# Patient Record
Sex: Male | Born: 1955 | Race: White | Hispanic: No | Marital: Single | Smoking: Former smoker
Health system: Southern US, Community
[De-identification: ages and names within clinical notes are randomized; demographics above are authoritative.]

## PROBLEM LIST (undated history)

## (undated) DIAGNOSIS — I739 Peripheral vascular disease, unspecified: Secondary | ICD-10-CM

## (undated) DIAGNOSIS — I10 Essential (primary) hypertension: Secondary | ICD-10-CM

## (undated) DIAGNOSIS — I2510 Atherosclerotic heart disease of native coronary artery without angina pectoris: Secondary | ICD-10-CM

## (undated) DIAGNOSIS — E785 Hyperlipidemia, unspecified: Secondary | ICD-10-CM

## (undated) DIAGNOSIS — Z85841 Personal history of malignant neoplasm of brain: Secondary | ICD-10-CM

---

## 2010-03-05 HISTORY — PX: OTHER SURGICAL HISTORY: SHX169

## 2019-08-31 ENCOUNTER — Other Ambulatory Visit: Payer: Self-pay

## 2019-08-31 ENCOUNTER — Encounter: Payer: Self-pay | Admitting: Emergency Medicine

## 2019-08-31 ENCOUNTER — Ambulatory Visit
Admission: EM | Admit: 2019-08-31 | Discharge: 2019-08-31 | Disposition: A | Discharge status: Home / Self Care | Payer: Self-pay

## 2019-08-31 DIAGNOSIS — H60392 Other infective otitis externa, left ear: Secondary | ICD-10-CM

## 2019-08-31 HISTORY — DX: Essential (primary) hypertension: I10

## 2019-08-31 MED ORDER — AMOXICILLIN 500 MG PO TABS: 500.0000 mg | ORAL_TABLET | Freq: Two times a day (BID) | ORAL | 0 refills | Status: AC

## 2019-08-31 NOTE — ED Provider Notes (Signed)
[facility]    CSN: [id number] Arrival date & time: 08/31/19  1044      History   Chief Complaint Chief Complaint  Patient presents with  . Otalgia    HPI Roger Solis is a 64 y.o. male with history of hypertension presenting for 2-day course of left ear pain and swelling.  Patient noted some drainage last night.  Denies trauma, travel, change in hearing, tinnitus, dizziness.  No fever, foreign body exposure or sensation.  States he has had ear infections in the past: Topical otic drops do not help, requesting amoxicillin.   Past Medical History:  Diagnosis Date  . Hypertension     There are no problems to display for this patient.   History reviewed. No pertinent surgical history.     Home Medications    Prior to Admission medications   Medication Sig Start Date End Date Taking? Authorizing Provider  aspirin EC 81 MG tablet Take 81 mg by mouth daily. Swallow whole.   Yes [provider]  carvedilol (COREG) 25 MG tablet Take 25 mg by mouth 2 (two) times daily with a meal.   Yes [provider]  clopidogrel (PLAVIX) 75 MG tablet Take 75 mg by mouth daily.   Yes [provider]  pravastatin (PRAVACHOL) 40 MG tablet Take 40 mg by mouth daily.   Yes [provider]  triamterene-hydrochlorothiazide (MAXZIDE) 75-50 MG tablet Take 1 tablet by mouth daily.   Yes [provider]  amoxicillin (AMOXIL) 500 MG tablet Take 1 tablet (500 mg total) by mouth 2 (two) times daily for 7 days. 08/31/19 09/07/19  Hall-Potvin, [place], PA-C    Family History Family History  Problem Relation Age of Onset  . Heart disease Mother   . Heart disease Father     Social History Social History   Tobacco Use  . Smoking status: Current Every Day Smoker    Packs/day: 1.00  . Smokeless tobacco: Never Used  Substance Use Topics  . Alcohol use: Yes    Comment: six pack a week  . Drug use: Never     Allergies   Patient has no  known allergies.   Review of Systems As per HPI   Physical Exam Triage Vital Signs ED Triage Vitals  Enc Vitals Group     BP      Pulse      Resp      Temp      Temp src      SpO2      Weight      Height      Head Circumference      Peak Flow      Pain Score      Pain Loc      Pain Edu?      Excl. in [place]?    No data found.  Updated Vital Signs BP 125/73 (BP Location: Left Arm)   Pulse 68   Temp (!) 97.5 F (36.4 C) (Oral)   Resp 18   SpO2 94%   Visual Acuity Right Eye Distance:   Left Eye Distance:   Bilateral Distance:    Right Eye Near:   Left Eye Near:    Bilateral Near:     Physical Exam Constitutional:      General: He is not in acute distress. HENT:     Head: Normocephalic and atraumatic.     Right Ear: Tympanic membrane, ear canal and external ear normal.  Left Ear: Tympanic membrane normal.     Ears:     Comments: Left ear with tragal tenderness.  Mild EAC swelling of left ear with moderate discharge.  No TM perforation, bulging, bleeding.  No foreign body Eyes:     General: No scleral icterus.    Pupils: Pupils are equal, round, and reactive to light.  Cardiovascular:     Rate and Rhythm: Normal rate.  Pulmonary:     Effort: Pulmonary effort is normal. No respiratory distress.     Breath sounds: No wheezing.  Skin:    Coloration: Skin is not jaundiced or pale.  Neurological:     Mental Status: He is alert and oriented to person, place, and time.      UC Treatments / Results  Labs (all labs ordered are listed, but only abnormal results are displayed) Labs Reviewed - No data to display  EKG   Radiology No results found.  Procedures Procedures (including critical care time)  Medications Ordered in UC Medications - No data to display  Initial Impression / Assessment and Plan / UC Course  I have reviewed the triage vital signs and the nursing notes.  Pertinent labs & imaging results that were available during my care of  the patient were reviewed by me and considered in my medical decision making (see chart for details).     Patient febrile, nontoxic in office today.  No change in hearing, systemic symptoms as mentioned in HPI.  Patient states otic drops have not been helpful in the past: States he gets ear infections almost annually.  Provided contact information for ENT for further evaluation/management for recurrent otitis externa.  Will provide amoxicillin in the interim.  Return precautions discussed, patient verbalized understanding and is agreeable to plan. Final Clinical Impressions(s) / UC Diagnoses   Final diagnoses:  Infective otitis externa of left ear     Discharge Instructions     Take antibiotic as prescribed for the next week. Return for worsening ear pain, swelling, discharge, bleeding, decreased hearing, development of jaw pain/swelling, fever.  Do NOT use Q-tips as these can cause your ear wax to get stuck, the tips may break off and become a foreign body requiring additional medical care, or puncture your eardrum.  Helpful prevention tip: Use a solution of equal parts isopropyl (rubbing) alcohol and white vinegar (acetic acid) in both ears after swimming.    ED Prescriptions    Medication Sig Dispense Auth. Provider   amoxicillin (AMOXIL) 500 MG tablet Take 1 tablet (500 mg total) by mouth 2 (two) times daily for 7 days. 14 tablet Hall-Potvin, [place], PA-C     PDMP not reviewed this encounter.   [place], [place] 08/31/19 [phone]

## 2019-08-31 NOTE — Discharge Instructions (Signed)
Take antibiotic as prescribed for the next week. Return for worsening ear pain, swelling, discharge, bleeding, decreased hearing, development of jaw pain/swelling, fever.  Do NOT use Q-tips as these can cause your ear wax to get stuck, the tips may break off and become a foreign body requiring additional medical care, or puncture your eardrum.  Helpful prevention tip: Use a solution of equal parts isopropyl (rubbing) alcohol and white vinegar (acetic acid) in both ears after swimming.

## 2019-08-31 NOTE — ED Triage Notes (Signed)
Pt presents to [facility]  For assessment of left ear pain x 2 days with some clear drainage last night.

## 2021-05-05 ENCOUNTER — Emergency Department (HOSPITAL_COMMUNITY): Payer: No Typology Code available for payment source

## 2021-05-05 ENCOUNTER — Observation Stay (HOSPITAL_COMMUNITY)
Admission: EM | Admit: 2021-05-05 | Discharge: 2021-05-05 | Disposition: A | Discharge status: Home / Self Care | Payer: No Typology Code available for payment source | Attending: Internal Medicine | Admitting: Internal Medicine

## 2021-05-05 ENCOUNTER — Encounter (HOSPITAL_COMMUNITY): Payer: Self-pay | Admitting: *Deleted

## 2021-05-05 ENCOUNTER — Other Ambulatory Visit: Payer: Self-pay

## 2021-05-05 DIAGNOSIS — Z20822 Contact with and (suspected) exposure to covid-19: Secondary | ICD-10-CM | POA: Insufficient documentation

## 2021-05-05 DIAGNOSIS — E46 Unspecified protein-calorie malnutrition: Secondary | ICD-10-CM | POA: Diagnosis not present

## 2021-05-05 DIAGNOSIS — C7931 Secondary malignant neoplasm of brain: Secondary | ICD-10-CM | POA: Insufficient documentation

## 2021-05-05 DIAGNOSIS — Z7982 Long term (current) use of aspirin: Secondary | ICD-10-CM | POA: Diagnosis not present

## 2021-05-05 DIAGNOSIS — D649 Anemia, unspecified: Secondary | ICD-10-CM | POA: Insufficient documentation

## 2021-05-05 DIAGNOSIS — I2510 Atherosclerotic heart disease of native coronary artery without angina pectoris: Secondary | ICD-10-CM | POA: Diagnosis not present

## 2021-05-05 DIAGNOSIS — Z951 Presence of aortocoronary bypass graft: Secondary | ICD-10-CM | POA: Insufficient documentation

## 2021-05-05 DIAGNOSIS — R569 Unspecified convulsions: Secondary | ICD-10-CM

## 2021-05-05 DIAGNOSIS — Z7901 Long term (current) use of anticoagulants: Secondary | ICD-10-CM | POA: Insufficient documentation

## 2021-05-05 DIAGNOSIS — I629 Nontraumatic intracranial hemorrhage, unspecified: Principal | ICD-10-CM | POA: Insufficient documentation

## 2021-05-05 DIAGNOSIS — F17210 Nicotine dependence, cigarettes, uncomplicated: Secondary | ICD-10-CM | POA: Insufficient documentation

## 2021-05-05 DIAGNOSIS — C3490 Malignant neoplasm of unspecified part of unspecified bronchus or lung: Secondary | ICD-10-CM | POA: Insufficient documentation

## 2021-05-05 DIAGNOSIS — Z95820 Peripheral vascular angioplasty status with implants and grafts: Secondary | ICD-10-CM | POA: Diagnosis not present

## 2021-05-05 DIAGNOSIS — I10 Essential (primary) hypertension: Secondary | ICD-10-CM | POA: Insufficient documentation

## 2021-05-05 DIAGNOSIS — Z79899 Other long term (current) drug therapy: Secondary | ICD-10-CM | POA: Diagnosis not present

## 2021-05-05 HISTORY — DX: Hyperlipidemia, unspecified: E78.5

## 2021-05-05 HISTORY — DX: Personal history of malignant neoplasm of brain: Z85.841

## 2021-05-05 HISTORY — DX: Atherosclerotic heart disease of native coronary artery without angina pectoris: I25.10

## 2021-05-05 HISTORY — DX: Peripheral vascular disease, unspecified: I73.9

## 2021-05-05 LAB — APTT
aPTT: 29 seconds (ref 24–36)
aPTT: 30 seconds (ref 24–36)

## 2021-05-05 LAB — COMPREHENSIVE METABOLIC PANEL
ALT: 13 U/L (ref 0–44)
AST: 17 U/L (ref 15–41)
Albumin: 3.2 g/dL — ABNORMAL LOW (ref 3.5–5.0)
Alkaline Phosphatase: 73 U/L (ref 38–126)
Anion gap: 7 (ref 5–15)
BUN: 8 mg/dL (ref 8–23)
CO2: 28 mmol/L (ref 22–32)
Calcium: 9.2 mg/dL (ref 8.9–10.3)
Chloride: 101 mmol/L (ref 98–111)
Creatinine, Ser: 0.84 mg/dL (ref 0.61–1.24)
GFR, Estimated: >60 mL/min (ref >60)
Glucose, Bld: 96 mg/dL (ref 70–99)
Potassium: 4.5 mmol/L (ref 3.5–5.1)
Sodium: 136 mmol/L (ref 135–145)
Total Bilirubin: 0.2 mg/dL — ABNORMAL LOW (ref 0.3–1.2)
Total Protein: 6.4 g/dL — ABNORMAL LOW (ref 6.5–8.1)

## 2021-05-05 LAB — RESP PANEL BY RT-PCR (FLU A&B, COVID) ARPGX2
Influenza A by PCR: NEGATIVE
Influenza B by PCR: NEGATIVE
SARS Coronavirus 2 by RT PCR: NEGATIVE

## 2021-05-05 LAB — PHOSPHORUS: Phosphorus: 3.5 mg/dL (ref 2.5–4.6)

## 2021-05-05 LAB — URINALYSIS, ROUTINE W REFLEX MICROSCOPIC
Bilirubin Urine: NEGATIVE
Glucose, UA: NEGATIVE mg/dL
Hgb urine dipstick: NEGATIVE
Ketones, ur: NEGATIVE mg/dL
Leukocytes,Ua: NEGATIVE
Nitrite: NEGATIVE
Protein, ur: NEGATIVE mg/dL
Specific Gravity, Urine: 1.01 (ref 1.005–1.030)
pH: 7 (ref 5.0–8.0)

## 2021-05-05 LAB — CBC
HCT: 39.1 % (ref 39.0–52.0)
Hemoglobin: 12.6 g/dL — ABNORMAL LOW (ref 13.0–17.0)
MCH: 29.9 pg (ref 26.0–34.0)
MCHC: 32.2 g/dL (ref 30.0–36.0)
MCV: 92.9 fL (ref 80.0–100.0)
Platelets: 215 10*3/uL (ref 150–400)
RBC: 4.21 MIL/uL — ABNORMAL LOW (ref 4.22–5.81)
RDW: 17.2 % — ABNORMAL HIGH (ref 11.5–15.5)
WBC: 5.4 10*3/uL (ref 4.0–10.5)
nRBC: 0 % (ref 0.0–0.2)

## 2021-05-05 LAB — RAPID URINE DRUG SCREEN, HOSP PERFORMED
Amphetamines: NOT DETECTED
Barbiturates: NOT DETECTED
Benzodiazepines: POSITIVE — AB
Cocaine: NOT DETECTED
Opiates: NOT DETECTED
Tetrahydrocannabinol: NOT DETECTED

## 2021-05-05 LAB — DIFFERENTIAL
Abs Immature Granulocytes: 0.03 10*3/uL (ref 0.00–0.07)
Basophils Absolute: 0 10*3/uL (ref 0.0–0.1)
Basophils Relative: 0 %
Eosinophils Absolute: 0.1 10*3/uL (ref 0.0–0.5)
Eosinophils Relative: 1 %
Immature Granulocytes: 1 %
Lymphocytes Relative: 18 %
Lymphs Abs: 1 10*3/uL (ref 0.7–4.0)
Monocytes Absolute: 0.7 10*3/uL (ref 0.1–1.0)
Monocytes Relative: 13 %
Neutro Abs: 3.6 10*3/uL (ref 1.7–7.7)
Neutrophils Relative %: 67 %

## 2021-05-05 LAB — ETHANOL: Alcohol, Ethyl (B): <10 mg/dL (ref <10)

## 2021-05-05 LAB — I-STAT CHEM 8, ED
BUN: 9 mg/dL (ref 8–23)
Calcium, Ion: 1.2 mmol/L (ref 1.15–1.40)
Chloride: 101 mmol/L (ref 98–111)
Creatinine, Ser: 0.9 mg/dL (ref 0.61–1.24)
Glucose, Bld: 90 mg/dL (ref 70–99)
HCT: 38 % — ABNORMAL LOW (ref 39.0–52.0)
Hemoglobin: 12.9 g/dL — ABNORMAL LOW (ref 13.0–17.0)
Potassium: 4.3 mmol/L (ref 3.5–5.1)
Sodium: 137 mmol/L (ref 135–145)
TCO2: 28 mmol/L (ref 22–32)

## 2021-05-05 LAB — PROTIME-INR
INR: 1 (ref 0.8–1.2)
INR: 1 (ref 0.8–1.2)
Prothrombin Time: 13.5 seconds (ref 11.4–15.2)
Prothrombin Time: 13.3 seconds (ref 11.4–15.2)

## 2021-05-05 LAB — MAGNESIUM: Magnesium: 1.7 mg/dL (ref 1.7–2.4)

## 2021-05-05 LAB — HIV ANTIBODY (ROUTINE TESTING W REFLEX): HIV Screen 4th Generation wRfx: NONREACTIVE

## 2021-05-05 IMAGING — CT CT HEAD W/O CM
3 series · 16 of 37 positions shown, 18 images · non-contrast
Comparison: None

CLINICAL DATA: Seizure, facial drooping LEFT, neurological deficit
suspected stroke, being treated for brain cancer, craniotomy 1 year
ago



[Series 3: head without · axial · non-contrast · 0.43mm/px · z∈[-104,+26]mm · 7 of 36 slices shown, 9 images]
[im 5/36  brain]
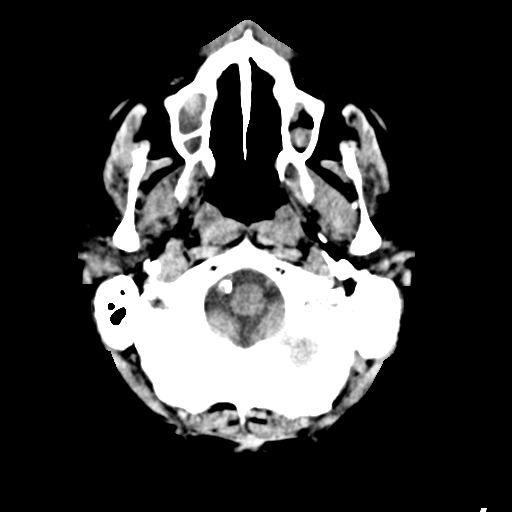
[im 5/36  bone]
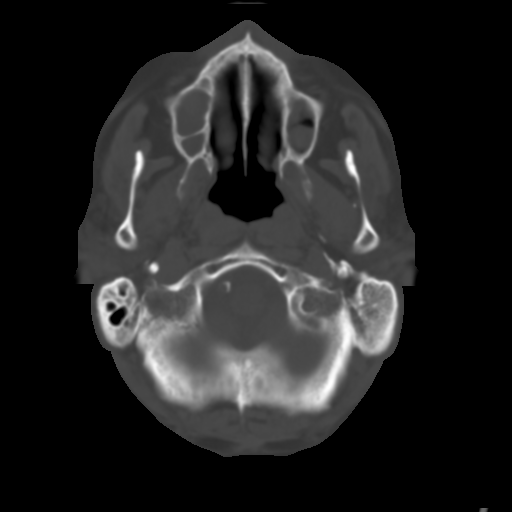
[im 9/36  brain]
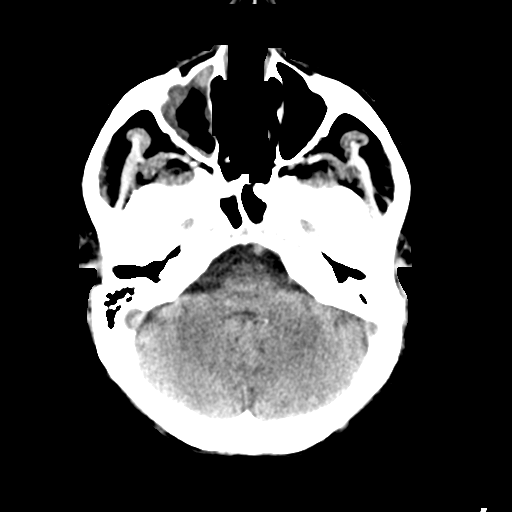
[im 14/36  brain]
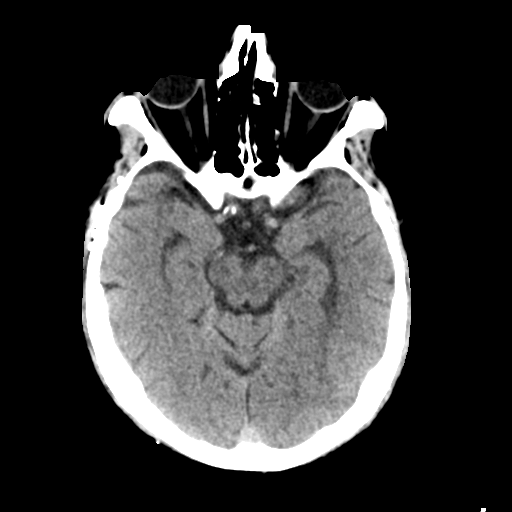
[im 18/36  brain]
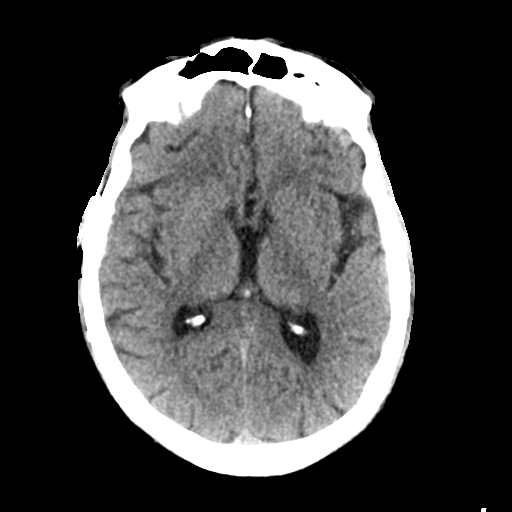
[im 22/36  brain]
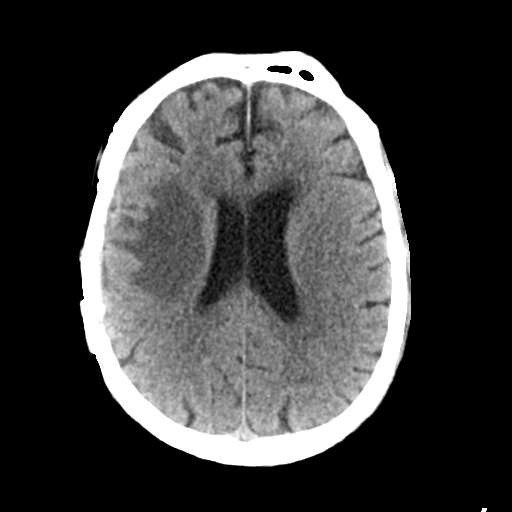
[im 22/36  bone]
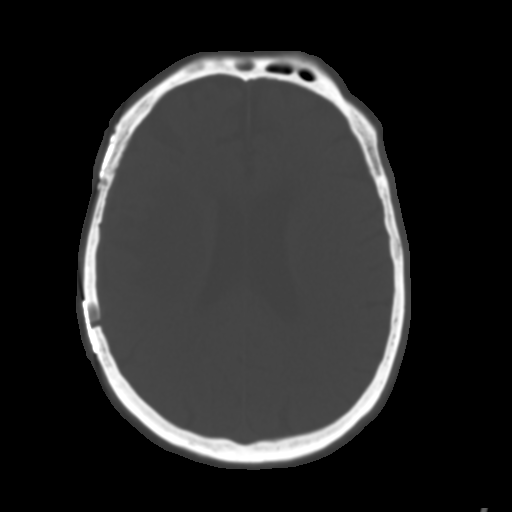
[im 27/36  brain]
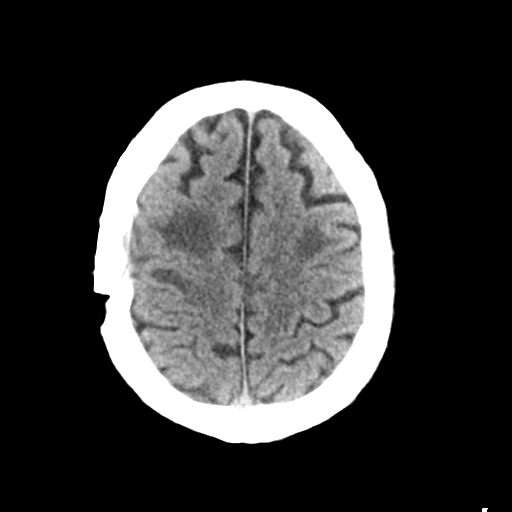
[im 31/36  brain]
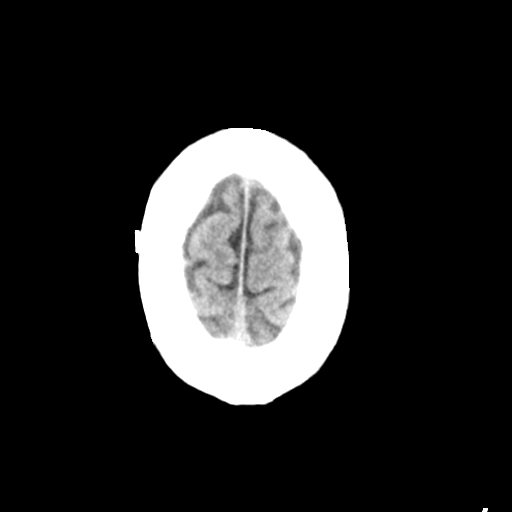

[Series 4: head bone · axial · 0.43mm/px · z∈[-108,-2]mm · 6 of 89 slices shown]
[im 9/89  bone]
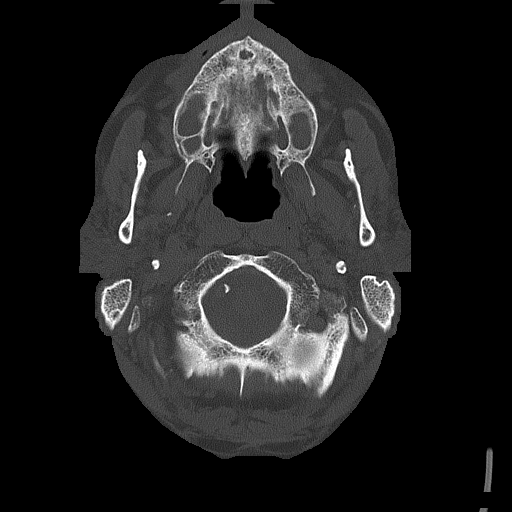
[im 18/89  bone]
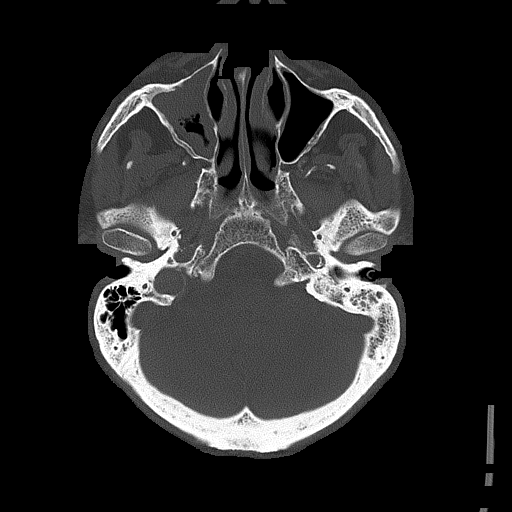
[im 27/89  bone]
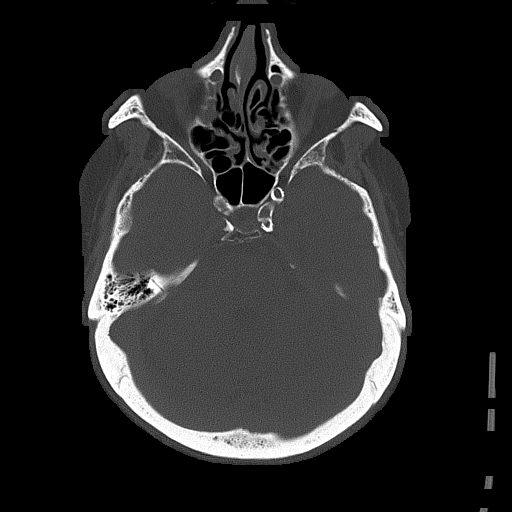
[im 40/89  bone]
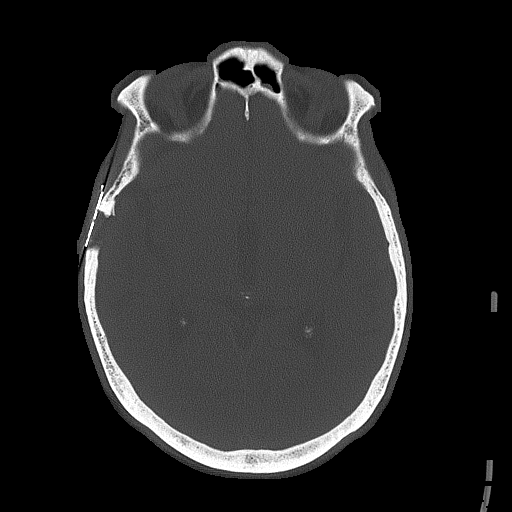
[im 49/89  bone]
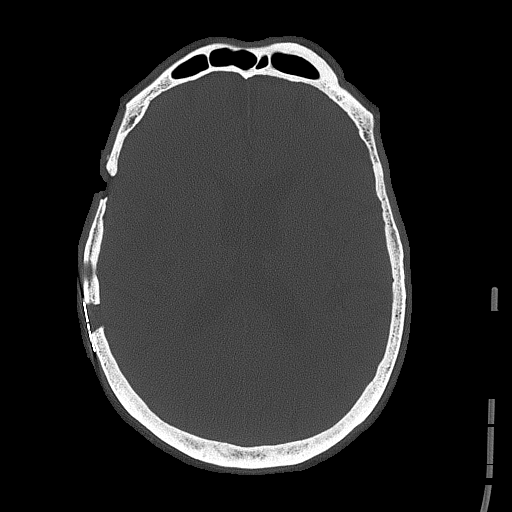
[im 62/89  bone]
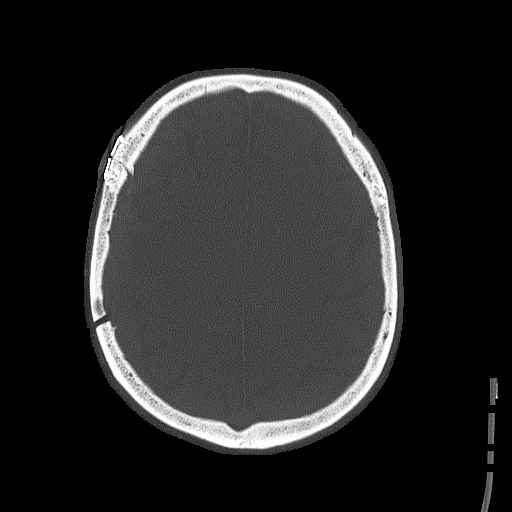

[Series 6: head without sag · sagittal · non-contrast · 0.35mm/px · 3 of 66 slices shown]
[im 22/66  brain]
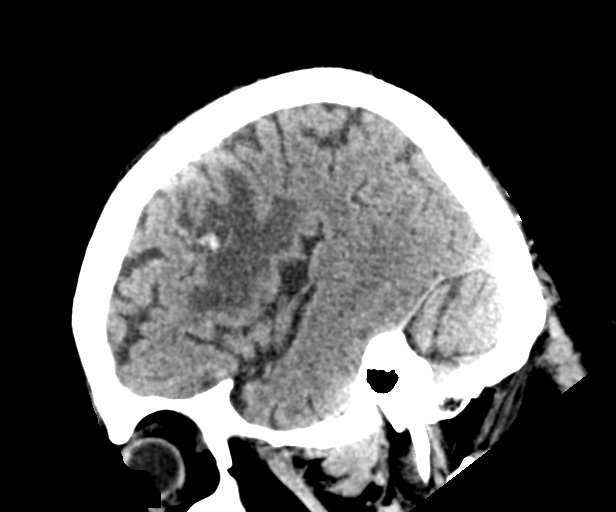
[im 33/66  brain]
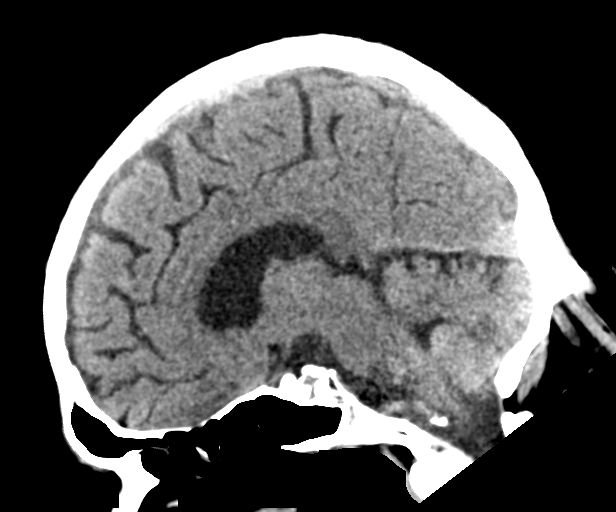
[im 44/66  brain]
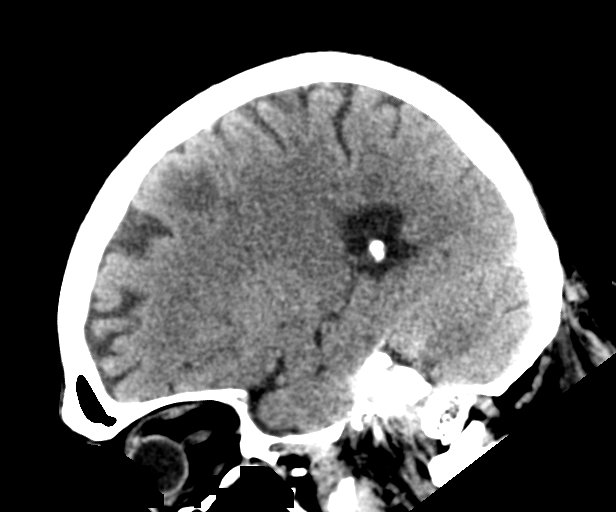

[16 of 37 positions shown; findings below may reference images not displayed]

FINDINGS: Brain: Slight asymmetry of the lateral ventricles, larger on LEFT.
No midline shift. Post craniotomy changes RIGHT calvaria. High
attenuation extra-axial at operative site likely a combination of
dural thickening and small amount of subdural blood, up to 6 mm
thick. Single small focus of intraparenchymal hemorrhage at the
margin of an area of vasogenic edema question site of tumor within
the RIGHT frontal lobe. Additional small focus of vasogenic edema
LEFT frontal lobe. No additional areas of hemorrhage, infarction, or
mass.

Vascular: No hyperdense vessels. Atherosclerotic calcifications of
internal carotid and vertebral arteries at skull base

Skull: Post craniotomy changes RIGHT calvaria

Sinuses/Orbits: Mucosal thickening ethmoid air cells and throughout
maxillary sinuses greater on RIGHT. Probable mucosal retention cyst
LEFT maxillary sinus.

Other: N/A
IMPRESSION: Post craniotomy changes RIGHT calvaria.

Highattenuation extra-axial at operative site likely a combination
of dural thickening and small amount of acute subdural hematoma in
combination up to 6 mm thick.

Single small focus of intraparenchymal hemorrhage at the margin of
an area of vasogenic edema question site of tumor within the RIGHT
frontal lobe.

Additional small focus of vasogenic edema LEFT frontal lobe.

Critical Value/emergent results were called by telephone at the time
of interpretation on [DATE] at [DATE] to provider DUGAC ,
who verbally acknowledged these results.

## 2021-05-05 IMAGING — MR MR HEAD WO/W CM
10 of 15 series · 31 of 48 positions shown · IV contrast (gadavist)
Comparison: None.

CLINICAL DATA: Seizure, new-onset, no history of trauma, reported
history of brain tumor?

EXAM:
MRI HEAD WITHOUT AND WITH CONTRAST
TECHNIQUE: Multiplanar, multiecho pulse sequences of the brain and surrounding
structures were obtained without and with intravenous contrast.
CONTRAST:  8.2mL GADAVIST GADOBUTROL 1 MMOL/ML IV SOLN

[Series 3: DWI · axial · 3.0mm · 1.09mm/px · z∈[-73,+83]mm · 7 of 108 slices shown (1 of 4)]
[im 1/108]
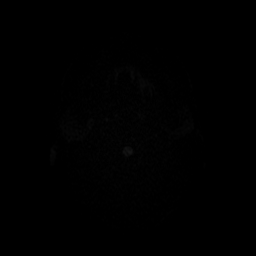
[im 18/108]
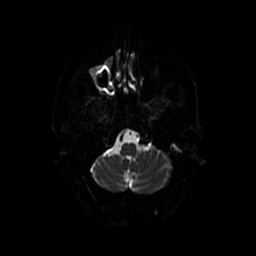
[im 36/108]
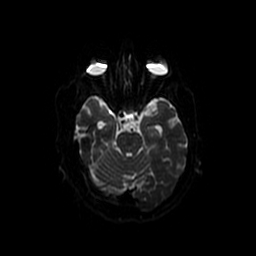
[im 54/108]
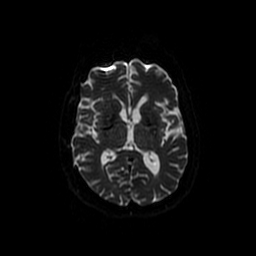
[im 72/108]
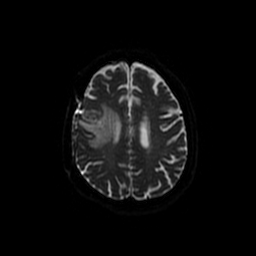
[im 90/108]
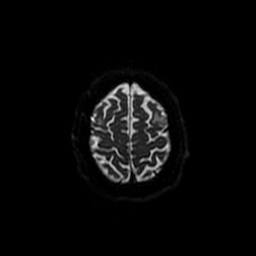
[im 108/108]
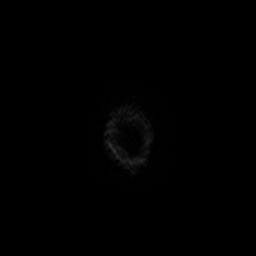

[Series 4: DWI · coronal · 5.0mm · 1.09mm/px · 4 of 76 slices shown (2 of 4)]
[im 1/76]
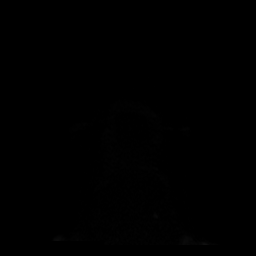
[im 26/76]
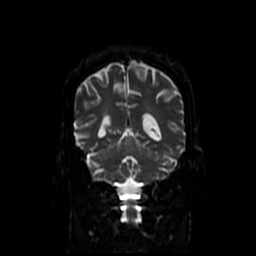
[im 51/76]
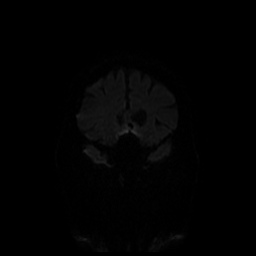
[im 76/76]
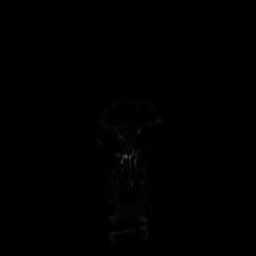

[Series 6: T2 · axial · 5.0mm · 0.43mm/px · 1 of 27 slices shown]
[im 1/27]
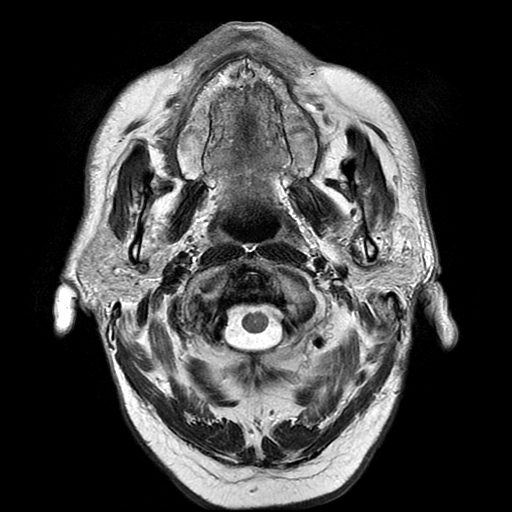

[Series 7: FLAIR · axial · 3.0mm · 0.43mm/px · z∈[-74,+79]mm · 2 of 27 slices shown (1 of 2)]
[im 1/27]
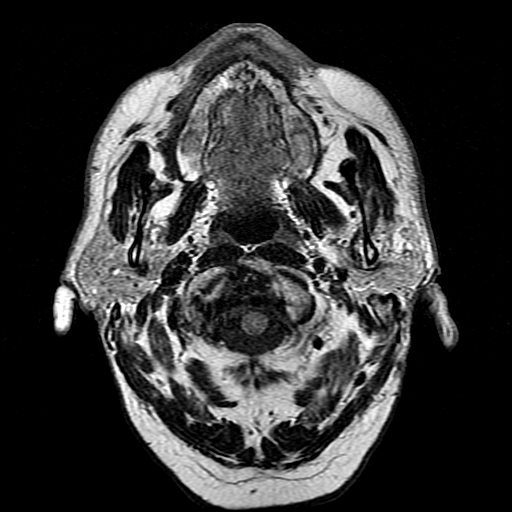
[im 27/27]
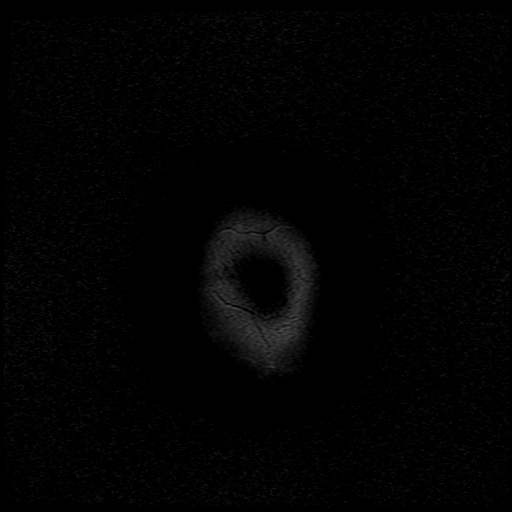

[Series 11: FLAIR · coronal · 3.0mm · 0.39mm/px · 2 of 35 slices shown (2 of 2)]
[im 1/35]
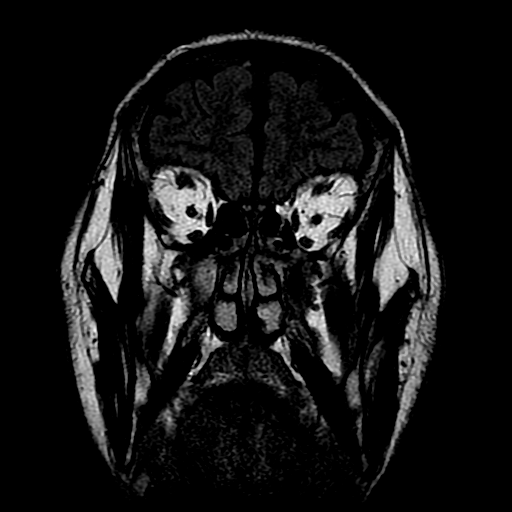
[im 35/35]
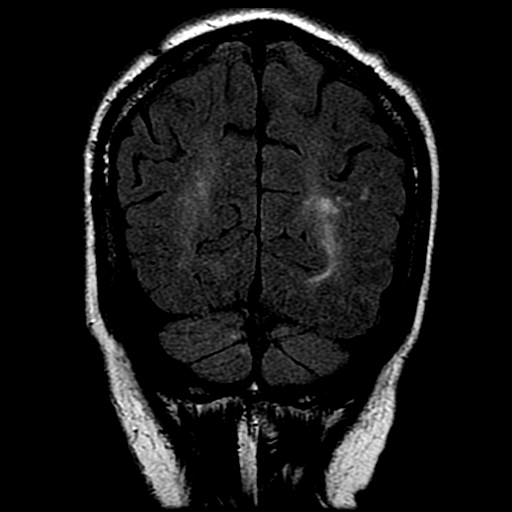

[Series 13: T1 post-contrast · axial · 3.0mm · 0.47mm/px · z∈[-75,+81]mm · 4 of 54 slices shown (1 of 3)]
[im 1/54]
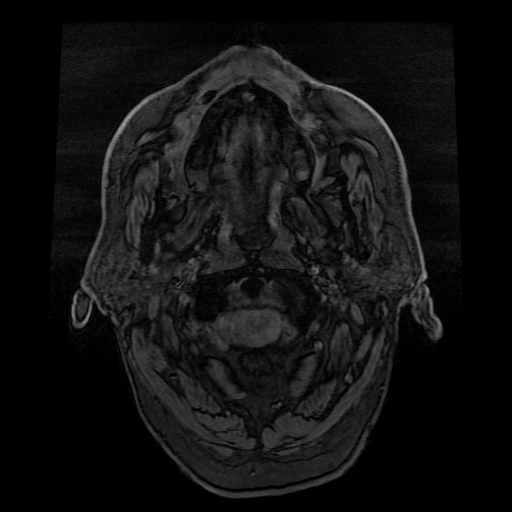
[im 18/54]
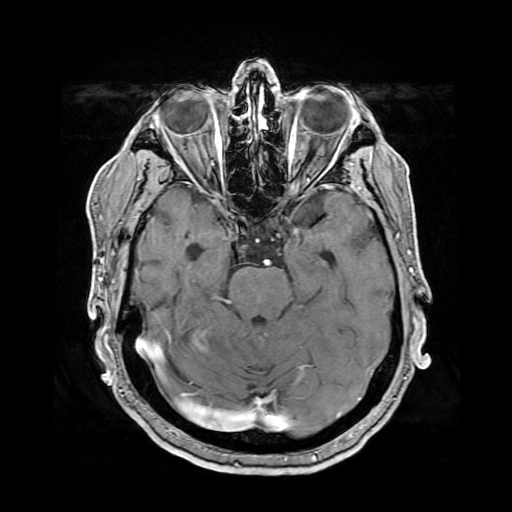
[im 36/54]
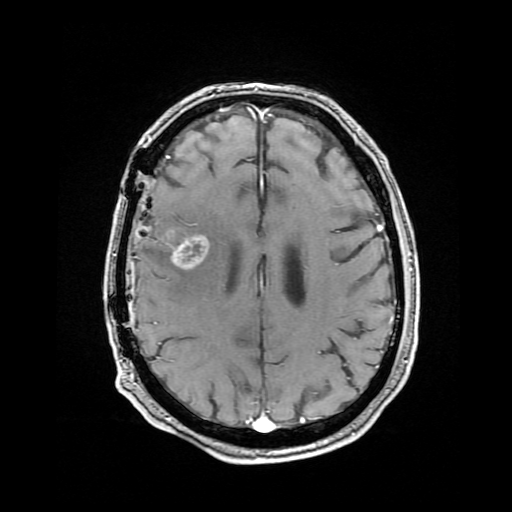
[im 54/54]
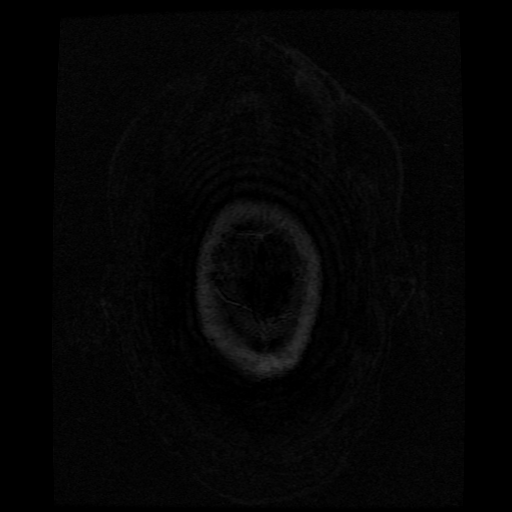

[Series 14: T1 post-contrast · coronal · 5.0mm · 0.39mm/px · 2 of 29 slices shown (2 of 3)]
[im 1/29]
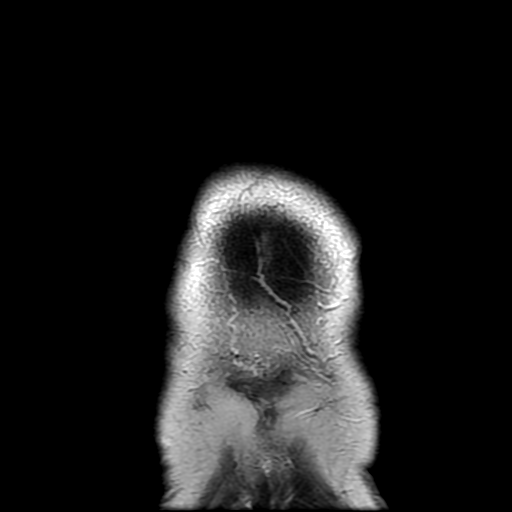
[im 29/29]
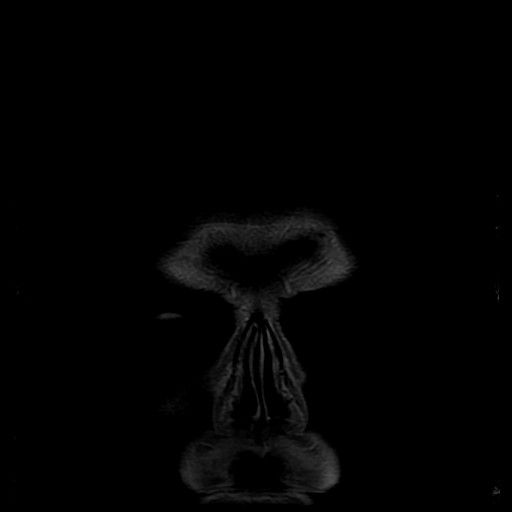

[Series 15: T1 post-contrast · sagittal · 5.0mm · 0.47mm/px · 2 of 25 slices shown (3 of 3)]
[im 1/25]
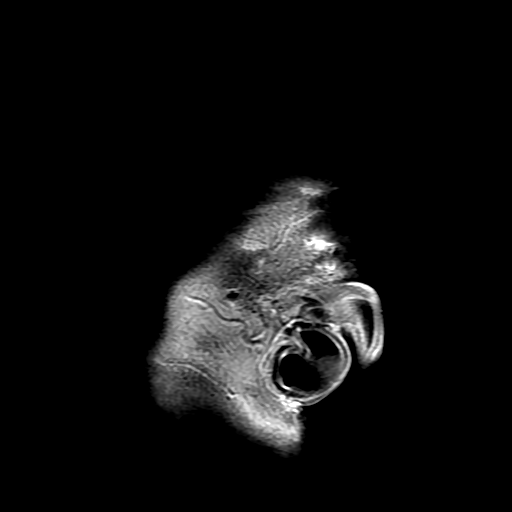
[im 25/25]
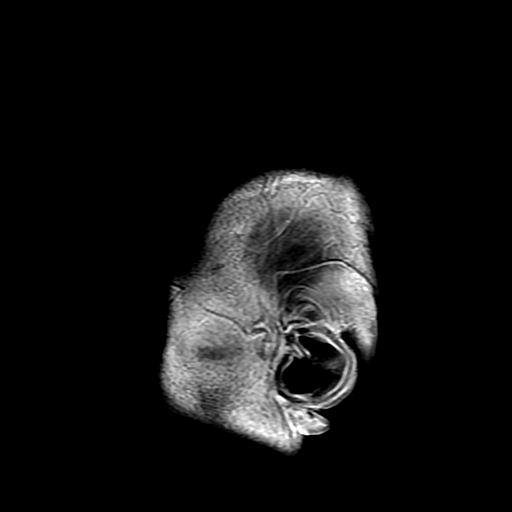

[Series 300: DWI · axial · 3.0mm · 1.09mm/px · z∈[-73,+80]mm · 4 of 53 slices shown (3 of 4)]
[im 1/53]
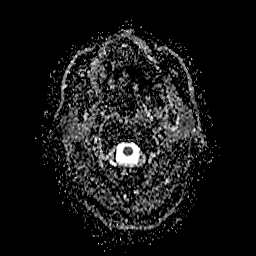
[im 18/53]
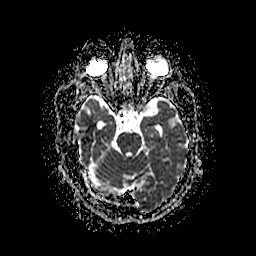
[im 35/53]
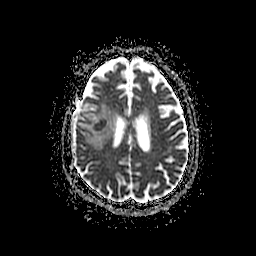
[im 53/53]
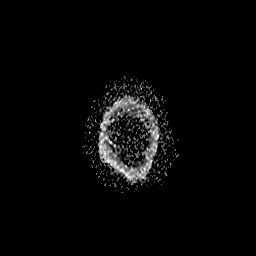

[Series 400: DWI · coronal · 5.0mm · 1.09mm/px · 3 of 38 slices shown (4 of 4)]
[im 1/38]
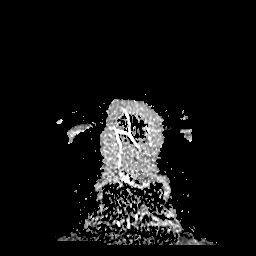
[im 19/38]
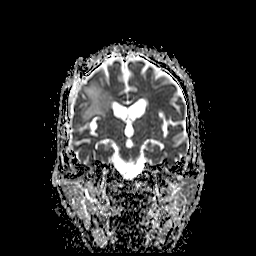
[im 38/38]
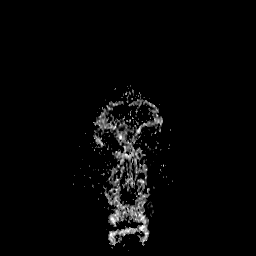

[31 of 48 positions shown; findings below may reference images not displayed]

FINDINGS: Brain: There are 2 adjacent enhancing lesions within the right
frontal lobe with different appearing more superficial and deep
portions. Together, measures about 1.9 x 2.2 x 2.2 cm. There is some
debris within the more superficial aspect that may represent a
surgical cavity. Corresponding hyperdensity on CT. There is
curvilinear mildly reduced diffusion anterosuperior to this
superficial component. The deeper portion demonstrates central
restricted diffusion. Moderate surrounding T2 FLAIR hyperintensity
is present without significant mass effect.

There is also a 9 mm enhancing cortical/subcortical lesion of the
left middle frontal gyrus with surrounding edema but no significant
mass effect. Small area of reduced diffusion is present at the
anterior margin.

Additional patchy foci of T2 hyperintensity in the supratentorial
white matter nonspecific but may reflect chronic microvascular
ischemic changes. There is no hydrocephalus. Thin extra-axial
collection underlies the craniotomy.

Vascular: Major vessel flow voids at the skull base are preserved.

Skull and upper cervical spine: Normal marrow signal is preserved.

Sinuses/Orbits: Primarily ethmoid and right maxillary mucosal
thickening. Orbits are unremarkable.

Other: Sella is unremarkable.  Patchy mastoid fluid opacification.
IMPRESSION: Right frontal contiguous enhancing lesions with different
appearances. Possible that the superficial lesion represents
surgical cavity or original tumor and deeper lesion represents
radiation necrosis. An abscess is a differential consideration
though this seems less likely based on available clinical
information.

9 mm left frontal lesion with edema may represent additional focus
of tumor.

Curvilinear reduced diffusion anterosuperior to the more superficial
right frontal lesion may represent small acute infarction.

## 2021-05-05 MED ORDER — LEVETIRACETAM 500 MG PO TABS
500.0000 mg | ORAL_TABLET | Freq: Two times a day (BID) | ORAL | Status: DC
  Administered 2021-05-05: 500 mg via ORAL

## 2021-05-05 MED ORDER — ACETAMINOPHEN 650 MG RE SUPP: 650.0000 mg | Freq: Four times a day (QID) | RECTAL | Status: DC | PRN

## 2021-05-05 MED ORDER — LEVETIRACETAM IN NACL 1000 MG/100ML IV SOLN
1000.0000 mg | INTRAVENOUS | Status: AC
  Administered 2021-05-05: 1000 mg via INTRAVENOUS
  Filled 2021-05-05: qty 100

## 2021-05-05 MED ORDER — ONDANSETRON HCL 4 MG/2ML IJ SOLN: 4.0000 mg | Freq: Four times a day (QID) | INTRAMUSCULAR | Status: DC | PRN

## 2021-05-05 MED ORDER — LEVETIRACETAM 500 MG PO TABS: 500.0000 mg | ORAL_TABLET | Freq: Two times a day (BID) | ORAL | 1 refills | Status: DC

## 2021-05-05 MED ORDER — GADOBUTROL 1 MMOL/ML IV SOLN
8.2000 mL | Freq: Once | INTRAVENOUS | Status: AC | PRN
  Administered 2021-05-05: 8.2 mL via INTRAVENOUS

## 2021-05-05 MED ORDER — ACETAMINOPHEN 325 MG PO TABS: 650.0000 mg | ORAL_TABLET | Freq: Four times a day (QID) | ORAL | Status: DC | PRN

## 2021-05-05 MED ORDER — SODIUM CHLORIDE 0.9 % IV SOLN
2000.0000 mg | Freq: Once | INTRAVENOUS | Status: DC
Start: 2021-05-05 — End: 2021-05-05

## 2021-05-05 MED ORDER — SODIUM CHLORIDE 0.9% FLUSH: 3.0000 mL | Freq: Two times a day (BID) | INTRAVENOUS | Status: DC

## 2021-05-05 MED ORDER — ONDANSETRON HCL 4 MG PO TABS: 4.0000 mg | ORAL_TABLET | Freq: Four times a day (QID) | ORAL | Status: DC | PRN

## 2021-05-05 MED ORDER — HYDRALAZINE HCL 25 MG PO TABS: 25.0000 mg | ORAL_TABLET | Freq: Once | ORAL | Status: DC

## 2021-05-05 NOTE — H&P (Signed)
Date: 05/05/2021     Patient Name:  Roger Solis MRN: [id number]  DOB: 1955-08-08 Age / Sex: 66 y.o., male   PCP: [facility] [place] Service: Internal Medicine Teaching Service       Attending Physician: Dr. Jimmye Norman, Elaina Pattee, MD    Intern (1st Contact): Dr. Rosezetta Schlatter Pager: [phone]  Resident (2nd Contact): Virl Axe, MD Pager: Sandrea Hammond [phone]       After Hours (After 5p/  First Contact Pager: [phone]  weekends / holidays): Second Contact Pager: [phone]   SUBJECTIVE:  Chief Complaint: Seizure-like activity  History of Present Illness: Roger Solis is a functionally limited 66 y.o. male with a pertinent PMH of HTN, HLD, CAD s/p CABG, PAD s/p PCI w/ stents, primary lung cancer with brain metastasis (not currently on chemotherapy), who presents to [facility] with seizure-like activity. The patient's history was provided by himself and his daughter who was at bedside.  The patient recently moved to [place] from [place] about a month ago after being diagnosed with a brain tumor.  Patient failed operative management with subsequent chemoradiation with his treatment over a month ago. Patient lives with his daughter who witnessed seizure-like activity consistent with focal twitching and shaking of his left arm and face. He then developed subsequent generalization of his symptoms with acute mental status change.  Patient had 3x subsequent episodes throughout this morning prior to presenting to the ED. He had an acute change in mentation that lasted 5 minutes before returning to baseline. His daughter denies any physical trauma as a result of the incident including head injuring. He had one prior episode of seizures that was thought to be secondary to medications.  As result, patient has not been on any AED medications. He denies any precipitating symptoms such as headache, presyncope, change in vision, focal neurologic deficits, fever, chills, sick contacts, or new  medications. He did notice that his first symptom started with focal left eye twitching. He also endorses some residual left arm weakness and facial asymmetry.    Medications: Patient is currently not on any medications due to his chemotherapy regimen.    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  ETOH - none Tobacco - Smoked 0.5-1 packs per day for 40 years  Family History: Family History  Problem Relation Age of Onset   Heart disease Mother    Heart disease Father     Allergies: Allergies as of 05/05/2021   (No Known Allergies)    Review of Systems: A complete ROS was negative except as per HPI.   OBJECTIVE:  Physical Exam: Blood pressure (!) 173/87, pulse 76, temperature 98.6 F (37 C), temperature source Oral, resp. rate 15, height 6\' 1"  (1.854 m), weight 82.6 kg, SpO2 99 %. Physical Exam Constitutional:      Appearance: Normal appearance.  HENT:     Head: Normocephalic.     Mouth/Throat:     Mouth: Mucous membranes are moist.     Comments: Superior denture  in place but no obvious tongue or cheek lesions/lacerations Eyes:     General: No visual field deficit.    Extraocular Movements: Extraocular movements intact.     Conjunctiva/sclera: Conjunctivae normal.     Pupils: Pupils are equal, round, and reactive to light.  Cardiovascular:     Rate and Rhythm: Normal rate.     Pulses: Normal pulses.     Heart sounds: No murmur heard. Pulmonary:     Effort: Pulmonary effort is normal.     Breath sounds: Normal breath  sounds.  Abdominal:     General: There is no distension.     Palpations: Abdomen is soft.     Tenderness: There is no abdominal tenderness.  Musculoskeletal:        General: No swelling or deformity. Normal range of motion.     Cervical back: Normal range of motion. No rigidity.  Skin:    General: Skin is warm and dry.     Capillary Refill: Capillary refill takes less than 2 seconds.     Findings: No bruising or rash.     Comments: Warm extremities without rash or skin breakdown No evidence of dermatoliposclerosis   Neurological:     Mental Status: He is alert. Mental status is at baseline.     Cranial Nerves: Facial asymmetry present. No dysarthria.     Sensory: No sensory deficit.     Motor: Weakness (Left upper extremity with abduction and external rotation) present.     Coordination: Romberg sign negative. Coordination normal. Finger-Nose-Finger Test normal.     Deep Tendon Reflexes: Reflexes normal.    Pertinent Labs: CBC    Component Value Date/Time   WBC 5.4 05/05/2021 0821   RBC 4.21 (L) 05/05/2021 0821   HGB 12.9 (L) 05/05/2021 0831   HCT 38.0 (L) 05/05/2021 0831   PLT 215 05/05/2021 0821   MCV 92.9 05/05/2021 0821   MCH 29.9 05/05/2021 0821   MCHC 32.2 05/05/2021 0821   RDW 17.2 (H) 05/05/2021 0821   LYMPHSABS 1.0 05/05/2021 0821   MONOABS 0.7 05/05/2021 0821   EOSABS 0.1 05/05/2021 0821   BASOSABS 0.0 05/05/2021 0821     CMP     Component Value Date/Time   NA 137 05/05/2021 0831   K 4.3 05/05/2021 0831   CL 101 05/05/2021 0831   CO2 28 05/05/2021 0821   GLUCOSE 90 05/05/2021 0831   BUN 9 05/05/2021 0831   CREATININE 0.90 05/05/2021 0831   CALCIUM 9.2 05/05/2021 0821   PROT 6.4 (L) 05/05/2021 0821   ALBUMIN 3.2 (L) 05/05/2021 0821   AST 17 05/05/2021 0821   ALT 13 05/05/2021 0821   ALKPHOS 73 05/05/2021 0821   BILITOT 0.2 (L) 05/05/2021 0821   GFRNONAA >60 05/05/2021 0821    Pertinent Imaging: CT HEAD WO CONTRAST Result Date:  05/05/2021 FINDINGS: Brain: Slight asymmetry of the lateral ventricles, larger on LEFT. No midline shift. Post craniotomy changes RIGHT calvaria. High attenuation extra-axial at operative site likely a combination of dural thickening and small amount of subdural blood, up to 6 mm thick. Single small focus of intraparenchymal hemorrhage at the margin of an area of vasogenic edema question site of tumor within the RIGHT frontal lobe. Additional small focus of vasogenic edema LEFT frontal lobe. No additional areas of hemorrhage, infarction, or mass. Vascular: No hyperdense vessels. Atherosclerotic calcifications of internal carotid and vertebral arteries at skull base Skull: Post craniotomy changes RIGHT calvaria Sinuses/Orbits: Mucosal thickening ethmoid air cells and throughout maxillary  sinuses greater on RIGHT. Probable mucosal retention cyst LEFT maxillary sinus. Other: N/A   IMPRESSION: Post craniotomy changes RIGHT calvaria. [place] extra-axial at operative site likely a combination of dural thickening and small amount of acute subdural hematoma in combination up to 6 mm thick. Single small focus of intraparenchymal hemorrhage at the margin of an area of vasogenic edema question site of tumor within the RIGHT frontal lobe. Additional small focus of vasogenic edema LEFT frontal lobe.  MRI BRAIN W?WO CONTRAST: Result Date: 05/05/2021 FINDINGS: Brain: There are 2 adjacent enhancing lesions within the right frontal lobe with different appearing more superficial and deep portions. Together, measures about 1.9 x 2.2 x 2.2 cm. There is some debris within the more superficial aspect that may represent a surgical cavity. Corresponding hyperdensity on CT. There is curvilinear mildly reduced diffusion anterosuperior to this superficial component. The deeper portion demonstrates central restricted diffusion. Moderate surrounding T2 FLAIR hyperintensity is present without significant mass effect. There is also a 9  mm enhancing cortical/subcortical lesion of the left middle frontal gyrus with surrounding edema but no significant mass effect. Small area of reduced diffusion is present at the anterior margin. Additional patchy foci of T2 hyperintensity in the supratentorial white matter nonspecific but may reflect chronic microvascular ischemic changes. There is no hydrocephalus. Thin extra-axial collection underlies the craniotomy. Vascular: Major vessel flow voids at the skull base are preserved. Skull and upper cervical spine: Normal marrow signal is preserved. Sinuses/Orbits: Primarily ethmoid and right maxillary mucosal thickening. Orbits are unremarkable. Other: Sella is unremarkable.  Patchy mastoid fluid opacification.   IMPRESSION: Right frontal contiguous enhancing lesions with different appearances. Possible that the superficial lesion represents surgical cavity or original tumor and deeper lesion represents radiation necrosis. An abscess is a differential consideration though this seems less likely based on available clinical information. 9 mm left frontal lesion with edema may represent additional focus of tumor. Curvilinear reduced diffusion anterosuperior to the more superficial right frontal lesion may represent small acute infarction.  EKG: normal EKG, normal sinus rhythm  ASSESSMENT & PLAN:  Patient Summary:  Roger Solis is a functionally limited 66 y.o. male with a pertinent PMH of HTN, HLD, CAD s/p CABG, PAD s/p PCI w/ stents, primary lung cancer with brain metastasis (not currently on chemotherapy), who presents to [facility] with seizure-like activity. and admit for Generalized Seizures.   Assessment: Principal Problem:   Generalized seizure ([place])  Plan: Generalized Seizure: Patient presents after multiple episodes of seizure-like activity in the setting of metastatic lung disease to his brain.  Patient is back to his baseline mentation with some residual left facial weakness and left upper arm  weakness. CT scan of his brain showed small amount of acute subdural hematoma and a small foci ventricle parenchymal hemorrhage with some vasogenic edema at the tumor site.  Follow-up MRI showed evidence of surgical cavitation with radiation necrosis in the right frontal lobe with 9 mm left frontal lesion consistent with edema.  Neurology evaluated the patient in the ED and started on Keppra.  Patient history is consistent with tumor as the etiology of his seizure.  -CBC does not show any evidence of leukocytosis and patient is afebrile -CMP does not show any significant electrolyte abnormalities, Mg/Phos pending  -Urinalysis and urine drug screen is pending, however patient denies illicit drug use. -Patient will likely need an EEG while starting AEDs. -Appreciate neurology's assistance comanaging this patient. - SLP pending - PT/OT consulted  CAD s/p CABG: History of CAD status post  double bypass surgery.  Patient denies any symptoms of chest pain or shortness of breath.  Additionally he does not have any evidence of heart failure secondary to ischemic cardiomyopathy on exam today.  Patient's CAD medications have been discontinued in the setting of chemotherapy administration therefore is not on aspirin or atorvastatin at this time.  EKG is reassuring.  PAD s/p PCI with DES: Patient endorses a history of peripheral artery disease with at least 1 episode of PCI with DES.  Patient is not currently on any antiplatelet therapy.  Or some claudication type symptoms.  However, his exam is reassuring with warm distal extremities and good peripheral pulses.  The is no significant skin breakdown or ulcers present.  However, this may limit his ability to use SCDs for DVT prophylaxis. -Consider repeat ABIs during this hospitalization -If ABIs are reassuring, patient may be able to tolerate SCDs.  Normocytic Anemia with elevated RDW: Patient has a mild normocytic anemia on evaluation.  There is no obvious  signs of bleeding on exam.  However, patient had recent imaging showing small intracranial hemorrhage and possible subdural.  Alternatively, patient has been on chemotherapy in the past several months which could be the etiology of his underlying anemia.  No urgent need for transfusion or iron supplementation.  -Consider ferritin and reticulocyte count -Consider iron studies  Protein-caloric malnutrition  Has a mild protein caloric malnutrition.  May benefit from consultation from RD for increased protein requirements.   Best Practice: Diet: NPO until swallow evaluation IVF: none VTE: none Code: Full Code AB: none Dispo: Observation with expected length of stay less than 2 midnights. Anticipated Discharge Location: Home Barriers to Discharge: Medical stability Family Contact:  daughter , at bedside.  Signature: Lawerance Cruel, D.O.  Internal Medicine Resident, PGY-3 [facility]  Pager #: [phone] (If no response, contact on-call pager) 11:35 AM, 05/05/2021   Please contact the on-call pager after 5 pm and on weekends at [phone].

## 2021-05-05 NOTE — ED Provider Notes (Addendum)
[facility] EMERGENCY DEPARTMENT Provider Note   CSN: [id number] Arrival date & time: 05/05/21  0736     History  Chief Complaint  Patient presents with   Seizures    Roger Solis is a 66 y.o. male.   Seizures  This patient is a 66 year old male, reportedly according to the daughter who is the primary historian he had a brain surgery in [place] about a year ago for a brain tumor, this could not be resected, they abandon the operation and decided to go with chemotherapy and radiation instead.  He is no longer on immunotherapy, he has recently within the last 2 months moved here to live with his daughter.  This morning approximately 1 hour ago the patient was witnessed to have a focal left-sided seizure that started with twitching and shaking of the left arm and the left face.  The patient then had secondary generalization of the seizure to involve both sides of the body, upper arms, legs, mental status was unresponsive and he had a postictal phase.  This happened several times this morning, he is not actively seizing has no headache, his daughter at the bedside reports that he does have some increasing facial droop compared to what is normal for him.  Discussion with this patient's daughter gives further detail that she is unaware of what type of brain tumor it was, he does not know, he is not on any medications except for the occasional aspirin when he has pain or a fever, no other anticoagulants at this time including no Plavix.  He does not take hypertension or cholesterol medications.  Home Medications Prior to Admission medications   Medication Sig Start Date End Date Taking? Authorizing Provider  aspirin EC 81 MG tablet Take 81 mg by mouth daily. Swallow whole.    [provider]  carvedilol (COREG) 25 MG tablet Take 25 mg by mouth 2 (two) times daily with a meal.    [provider]  clopidogrel (PLAVIX) 75 MG tablet Take 75 mg by mouth  daily.    [provider]  pravastatin (PRAVACHOL) 40 MG tablet Take 40 mg by mouth daily.    [provider]  triamterene-hydrochlorothiazide (MAXZIDE) 75-50 MG tablet Take 1 tablet by mouth daily.    [provider]      Allergies    Patient has no known allergies.    Review of Systems   Review of Systems  Neurological:  Positive for seizures.  All other systems reviewed and are negative.  Physical Exam Updated Vital Signs BP (!) 156/75    Pulse 76    Temp 98.6 F (37 C) (Oral)    Resp 19    Ht 1.854 m (6\' 1" )    Wt 82.6 kg    SpO2 92%    BMI 24.01 kg/m  Physical Exam Vitals and nursing note reviewed.  Constitutional:      General: He is not in acute distress.    Appearance: He is well-developed.  HENT:     Head: Normocephalic and atraumatic.     Comments: Scar on the right side of the temporal parietal scalp    Mouth/Throat:     Pharynx: No oropharyngeal exudate.  Eyes:     General: No scleral icterus.       Right eye: No discharge.        Left eye: No discharge.     Conjunctiva/sclera: Conjunctivae normal.     Pupils: Pupils are equal, round, and  reactive to light.  Neck:     Thyroid: No thyromegaly.     Vascular: No JVD.  Cardiovascular:     Rate and Rhythm: Normal rate and regular rhythm.     Heart sounds: Normal heart sounds. No murmur heard.   No friction rub. No gallop.  Pulmonary:     Effort: Pulmonary effort is normal. No respiratory distress.     Breath sounds: Normal breath sounds. No wheezing or rales.  Abdominal:     General: Bowel sounds are normal. There is no distension.     Palpations: Abdomen is soft. There is no mass.     Tenderness: There is no abdominal tenderness.  Musculoskeletal:        General: No tenderness. Normal range of motion.     Cervical back: Normal range of motion and neck supple.  Lymphadenopathy:     Cervical: No cervical adenopathy.  Skin:    General: Skin is warm and dry.     Findings: No  erythema or rash.  Neurological:     Mental Status: He is alert.     Coordination: Coordination normal.     Comments: Left facial droop, slight slurred speech, able to move all 4 extremities with normal strength coordination and sensation.  Psychiatric:        Behavior: Behavior normal.    ED Results / Procedures / Treatments   Labs (all labs ordered are listed, but only abnormal results are displayed) Labs Reviewed  CBC - Abnormal; Notable for the following components:      Result Value   RBC 4.21 (*)    Hemoglobin 12.6 (*)    RDW 17.2 (*)    All other components within normal limits  COMPREHENSIVE METABOLIC PANEL - Abnormal; Notable for the following components:   Total Protein 6.4 (*)    Albumin 3.2 (*)    Total Bilirubin 0.2 (*)    All other components within normal limits  I-STAT CHEM 8, ED - Abnormal; Notable for the following components:   Hemoglobin 12.9 (*)    HCT 38.0 (*)    All other components within normal limits  RESP PANEL BY RT-PCR (FLU A&B, COVID) ARPGX2  ETHANOL  PROTIME-INR  APTT  DIFFERENTIAL  RAPID URINE DRUG SCREEN, HOSP PERFORMED  URINALYSIS, ROUTINE W REFLEX MICROSCOPIC    EKG EKG Interpretation  Date/Time:  Friday May 05 2021 07:40:08 EST Ventricular Rate:  88 PR Interval:  134 QRS Duration: 90 QT Interval:  374 QTC Calculation: 453 R Axis:   88 Text Interpretation: Sinus rhythm Borderline right axis deviation Confirmed by Noemi Chapel [phone]) on 05/05/2021 7:45:06 AM  Radiology CT HEAD WO CONTRAST  Result Date: 05/05/2021 CLINICAL DATA:  Seizure, facial drooping LEFT, neurological deficit suspected stroke, being treated for brain cancer, craniotomy 1 year ago EXAM: CT HEAD WITHOUT CONTRAST TECHNIQUE: Contiguous axial images were obtained from the base of the skull through the vertex without intravenous contrast. RADIATION DOSE REDUCTION: This exam was performed according to the departmental dose-optimization program which includes  automated exposure control, adjustment of the mA and/or kV according to patient size and/or use of iterative reconstruction technique. COMPARISON:  None FINDINGS: Brain: Slight asymmetry of the lateral ventricles, larger on LEFT. No midline shift. Post craniotomy changes RIGHT calvaria. High attenuation extra-axial at operative site likely a combination of dural thickening and small amount of subdural blood, up to 6 mm thick. Single small focus of intraparenchymal hemorrhage at the margin of an area of vasogenic edema  question site of tumor within the RIGHT frontal lobe. Additional small focus of vasogenic edema LEFT frontal lobe. No additional areas of hemorrhage, infarction, or mass. Vascular: No hyperdense vessels. Atherosclerotic calcifications of internal carotid and vertebral arteries at skull base Skull: Post craniotomy changes RIGHT calvaria Sinuses/Orbits: Mucosal thickening ethmoid air cells and throughout maxillary sinuses greater on RIGHT. Probable mucosal retention cyst LEFT maxillary sinus. Other: N/A IMPRESSION: Post craniotomy changes RIGHT calvaria. Highattenuation extra-axial at operative site likely a combination of dural thickening and small amount of acute subdural hematoma in combination up to 6 mm thick. Single small focus of intraparenchymal hemorrhage at the margin of an area of vasogenic edema question site of tumor within the RIGHT frontal lobe. Additional small focus of vasogenic edema LEFT frontal lobe. Critical Value/emergent results were called by telephone at the time of interpretation on 05/05/2021 at 8:47 am to provider [facility] , who verbally acknowledged these results. Electronically Signed   By: Lavonia Dana M.D.   On: 05/05/2021 08:49    Procedures .Critical Care Performed by: Noemi Chapel, MD Authorized by: Noemi Chapel, MD   Critical care provider statement:    Critical care time (minutes):  35   Critical care time was exclusive of:  Separately billable procedures  and treating other patients and teaching time   Critical care was necessary to treat or prevent imminent or life-threatening deterioration of the following conditions:  CNS failure or compromise   Critical care was time spent personally by me on the following activities:  Development of treatment plan with patient or surrogate, discussions with consultants, evaluation of patient's response to treatment, examination of patient, ordering and review of laboratory studies, ordering and review of radiographic studies, ordering and performing treatments and interventions, pulse oximetry, re-evaluation of patient's condition, review of old charts and obtaining history from patient or surrogate   I assumed direction of critical care for this patient from another provider in my specialty: no     Care discussed with: admitting provider   Comments:          Medications Ordered in ED Medications  levETIRAcetam (KEPPRA) IVPB 1000 mg/100 mL premix (has no administration in time range)    ED Course/ Medical Decision Making/ A&P Clinical Course as of 05/05/21 0939  Fri May 05, 2021  0848 CT HEAD WO CONTRAST [LM]    Clinical Course User Index [LM] Ronny Bacon                           Medical Decision Making Amount and/or Complexity of Data Reviewed Labs: ordered. Radiology: ordered.  Risk Prescription drug management. Decision regarding hospitalization.   This patient presents to the ED for concern of seizure, this involves an extensive number of treatment options, and is a complaint that carries with it a high risk of complications and morbidity.  The differential diagnosis includes tumor, intracranial hemorrhage, prior irritable focus from prior surgery, medications, hypoglycemia, alcohol withdrawal.  The patient does not drink alcohol and his sugar was normal, he has not had anything to eat today.  He was given Versed prior to arrival upon further investigation into EMS  intervention   Co morbidities that complicate the patient evaluation  Prior brain tumor   Additional history obtained:  Additional history obtained from daughter at the bedside External records from outside source obtained and reviewed including no records available, patient is in the [place] system   Lab Tests:  I Ordered, and personally interpreted labs.  The pertinent results include: CBC and metabolic panel which were unremarkable, hemoglobin of 12.9, normal platelets, normal white blood cell count.  INR of 1.0   Imaging Studies ordered:  I ordered imaging studies including CT scan of the brain as well as MRI I independently visualized and interpreted imaging which showed intracranial hemorrhage, multiple spots I agree with the radiologist interpretation   Cardiac Monitoring:  The patient was maintained on a cardiac monitor.  I personally viewed and interpreted the cardiac monitored which showed an underlying rhythm of: sinus rhythm, occasional ectopy   Medicines ordered and prescription drug management:  I ordered medication including Keppra  for Seizures  Reevaluation of the patient after these medicines showed that the patient improved I have reviewed the patients home medicines and have made adjustments as needed   Critical Interventions:  Keppra IV CT - showing hemorrhage MRI Consultation with Neurology, Neurosurgery, Hospitalist Admission to hospital   Consultations Obtained:  I requested consultation with the neurologist, Sal,  and discussed lab and imaging findings as well as pertinent plan - they recommend: Admission to the hospital for observation, CT scan with following MRI, will likely need some EEG monitoring, they recommend loading with Keppra 2000 mg Recommends against being aggressive about TNK as the patient would not qualify and he would not qualify for aggressive clot removal given history of mild symptoms with facial droop. Reconsulted with  neurology Dr. Lorrin Goodell, suggests consult with neurosurgery given bleeding, they will see the patient in consultation.  Neurosurgery consulted - recommend no surgery - they will see in person, BP of > 140 according to their recommendations.  Dose of meds given - hydralazine  Hospitalist consulted for admission - IM resident called back and will admit   Problem List / ED Course:  Brain tumor -obtaining MRI to further evaluate to make sure there is no signs of hemorrhage or growth Seizure, loading with Keppra, neuroimaging, admission with EEG Bleeding - nothing to revers - BP 156/75 - will discuss BP goals with NS. Consultation with neurology    Social Determinants of Health:  Recently moved here Establishing care with [facility]           Final Clinical Impression(s) / ED Diagnoses Final diagnoses:  Seizure ([place])  Intracranial hemorrhage [facility])    Rx / DC Orders ED Discharge Orders     None         Noemi Chapel, MD 05/05/21 0923    Noemi Chapel, MD 05/05/21 1029

## 2021-05-05 NOTE — ED Notes (Signed)
Returned from MRI 

## 2021-05-05 NOTE — ED Notes (Signed)
Patient still in MRI.  

## 2021-05-05 NOTE — ED Triage Notes (Signed)
Patient presents to ed via [facility]  per family patient has a hx. Of brain ca of which he is getting treatments at the [facility] , per family patient had a seizure that lasted approx. 2 mins. Had facial drooping on the left side. Upon ems arrival didn't notice facial drooping. Ems witnessed 2 seizures involving left side of face twitching only after 2nd seizure ems gave 2.5 versed. Upon arrival patient is alert oriented. Denies pain.  ?

## 2021-05-05 NOTE — ED Notes (Signed)
Neurologist paged at 7:58 ?

## 2021-05-05 NOTE — Discharge Summary (Signed)
Name: Roger Solis MRN: [id number] DOB: [birth year] 66 y.o. 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   aspirin EC 81 MG tablet Take 81 mg by mouth daily. Swallow whole.   levETIRAcetam 500 MG tablet Commonly known as: KEPPRA Take 1 tablet (500 mg total) by mouth 2 (two) times daily. Start taking on: May 06, 2021   ondansetron 8 MG tablet Commonly known as: ZOFRAN Take 8 mg by mouth every 8 (eight) hours as needed for nausea/vomiting.   promethazine 25 MG tablet Commonly known as: PHENERGAN Take 25 mg by mouth every 8 (eight) hours as needed for nausea/vomiting.   Tiotropium Bromide-Olodaterol 2.5-2.5 MCG/ACT Aers Inhale 2 puffs into the lungs daily as needed for shortness of breath.        Disposition and follow-up:   Roger Solis was discharged from [facility] in [place] condition.  At the hospital follow up visit please address:  1.  Seizures x3 with secondary generalization: 2/2 brain metastasis. Discharging with Keppra 500mg  BID for maintenance. Placed referral to [facility] for outpatient neurology follow up.  2.  Labs / imaging needed at time of follow-up: None  3.  Pending labs/ test needing follow-up: None  Follow-up Appointments: -F/u at [facility] in 1-2 weeks  Hospital Course by problem list: 1. Witnessed Seizures x3 with secondary  generalization - Roger Solis is a 66 year old gentleman with history of primary lung cancer with brain metastasis (not currently on chemotherapy). He recently moved to [facility] from [place] about a month ago after being diagnosed with a brain tumor. He failed operative management and underwent subsequent chemoradiation over a month ago. Moved to [place] to live with his daughter. He presented to the ED after multiple witnessed seizures in the setting of metastatic lung disease to his brain. On admission, he is back at baseline mentation with some residual left facial weakness and left upper arm weakness. CT scan of brain showed small amount of acute subdural hematoma and a small intraparenchymal hemorrhage with some vasogenic edema at the tumor site. MRI brain showed evidence of surgical cavitation with radiation necrosis in the right frontal lobe with 5mm left frontal lesion consistent with edema. Neurology consulted and patient was started on Keppra. Despite discussing with patient to remain for overnight observation due to risk of additional seizures and clustering, patient is adamant about leaving today. Prescribed oral Keppra 500mg  BID for maintenance therapy and placed referral to [facility] for outpatient follow up.    Pertinent Labs, Studies, and Procedures:  CBC    Component Value Date/Time   WBC 5.4 05/05/2021 0821   RBC 4.21 (L) 05/05/2021 0821   HGB 12.9 (L) 05/05/2021 0831   HCT 38.0 (L) 05/05/2021 0831   PLT 215 05/05/2021 0821   MCV 92.9 05/05/2021 0821   MCH 29.9 05/05/2021 0821   MCHC 32.2 05/05/2021 0821   RDW 17.2 (  H) 05/05/2021 0821   LYMPHSABS 1.0 05/05/2021 0821   MONOABS 0.7 05/05/2021 0821   EOSABS 0.1 05/05/2021 0821   BASOSABS 0.0 05/05/2021 0821   CMP     Component Value Date/Time   NA 137 05/05/2021 0831   K 4.3 05/05/2021 0831   CL 101 05/05/2021 0831   CO2 28 05/05/2021 0821   GLUCOSE 90 05/05/2021 0831   BUN 9 05/05/2021 0831   CREATININE  0.90 05/05/2021 0831   CALCIUM 9.2 05/05/2021 0821   PROT 6.4 (L) 05/05/2021 0821   ALBUMIN 3.2 (L) 05/05/2021 0821   AST 17 05/05/2021 0821   ALT 13 05/05/2021 0821   ALKPHOS 73 05/05/2021 0821   BILITOT 0.2 (L) 05/05/2021 0821   GFRNONAA >60 05/05/2021 0821    Urinalysis    Component Value Date/Time   COLORURINE YELLOW 05/05/2021 1055   APPEARANCEUR CLEAR 05/05/2021 1055   LABSPEC 1.010 05/05/2021 1055   PHURINE 7.0 05/05/2021 1055   GLUCOSEU NEGATIVE 05/05/2021 1055   HGBUR NEGATIVE 05/05/2021 1055   BILIRUBINUR NEGATIVE 05/05/2021 1055   KETONESUR NEGATIVE 05/05/2021 1055   PROTEINUR NEGATIVE 05/05/2021 1055   NITRITE NEGATIVE 05/05/2021 1055   LEUKOCYTESUR NEGATIVE 05/05/2021 1055    Drugs of Abuse     Component Value Date/Time   LABOPIA NONE DETECTED 05/05/2021 1055   COCAINSCRNUR NONE DETECTED 05/05/2021 1055   LABBENZ POSITIVE (A) 05/05/2021 1055   AMPHETMU NONE DETECTED 05/05/2021 1055   THCU NONE DETECTED 05/05/2021 1055   LABBARB NONE DETECTED 05/05/2021 1055    CT HEAD WO CONTRAST  Result Date: 05/05/2021 CLINICAL DATA:  Seizure, facial drooping LEFT, neurological deficit suspected stroke, being treated for brain cancer, craniotomy 1 year ago EXAM: CT HEAD WITHOUT CONTRAST TECHNIQUE: Contiguous axial images were obtained from the base of the skull through the vertex without intravenous contrast. RADIATION DOSE REDUCTION: This exam was performed according to the departmental dose-optimization program which includes automated exposure control, adjustment of the mA and/or kV according to patient size and/or use of iterative reconstruction technique. COMPARISON:  None FINDINGS: Brain: Slight asymmetry of the lateral ventricles, larger on LEFT. No midline shift. Post craniotomy changes RIGHT calvaria. High attenuation extra-axial at operative site likely a combination of dural thickening and small amount of subdural blood, up to 6 mm thick. Single small focus of  intraparenchymal hemorrhage at the margin of an area of vasogenic edema question site of tumor within the RIGHT frontal lobe. Additional small focus of vasogenic edema LEFT frontal lobe. No additional areas of hemorrhage, infarction, or mass. Vascular: No hyperdense vessels. Atherosclerotic calcifications of internal carotid and vertebral arteries at skull base Skull: Post craniotomy changes RIGHT calvaria Sinuses/Orbits: Mucosal thickening ethmoid air cells and throughout maxillary sinuses greater on RIGHT. Probable mucosal retention cyst LEFT maxillary sinus. Other: N/A IMPRESSION: Post craniotomy changes RIGHT calvaria. Highattenuation extra-axial at operative site likely a combination of dural thickening and small amount of acute subdural hematoma in combination up to 6 mm thick. Single small focus of intraparenchymal hemorrhage at the margin of an area of vasogenic edema question site of tumor within the RIGHT frontal lobe. Additional small focus of vasogenic edema LEFT frontal lobe. Critical Value/emergent results were called by telephone at the time of interpretation on 05/05/2021 at 8:47 am to provider [facility] , who verbally acknowledged these results. Electronically Signed   By: Lavonia Dana M.D.   On: 05/05/2021 08:49   MR Brain W and Wo Contrast  Result Date: 05/05/2021 CLINICAL DATA:  Seizure, new-onset, no history of trauma, reported history of brain tumor? EXAM: MRI HEAD WITHOUT AND WITH CONTRAST TECHNIQUE: Multiplanar, multiecho pulse sequences of the brain and surrounding structures were obtained without and with intravenous contrast. CONTRAST:  8.68mL GADAVIST GADOBUTROL 1 MMOL/ML IV SOLN COMPARISON:  None. FINDINGS: Brain: There are 2 adjacent enhancing lesions within the right frontal lobe with different appearing more superficial and deep portions. Together, measures about 1.9 x 2.2 x 2.2 cm. There is some debris within the more superficial aspect that may represent a surgical cavity.  Corresponding hyperdensity on CT. There is curvilinear mildly reduced diffusion anterosuperior to this superficial component. The deeper portion demonstrates central restricted diffusion. Moderate surrounding T2 FLAIR hyperintensity is present without significant mass effect. There is also a 9 mm enhancing cortical/subcortical lesion of the left middle frontal gyrus with surrounding edema but no significant mass effect. Small area of reduced diffusion is present at the anterior margin. Additional patchy foci of T2 hyperintensity in the supratentorial white matter nonspecific but may reflect chronic microvascular ischemic changes. There is no hydrocephalus. Thin extra-axial collection underlies the craniotomy. Vascular: Major vessel flow voids at the skull base are preserved. Skull and upper cervical spine: Normal marrow signal is preserved. Sinuses/Orbits: Primarily ethmoid and right maxillary mucosal thickening. Orbits are unremarkable. Other: Sella is unremarkable.  Patchy mastoid fluid opacification. IMPRESSION: Right frontal contiguous enhancing lesions with different appearances. Possible that the superficial lesion represents surgical cavity or original tumor and deeper lesion represents radiation necrosis. An abscess is a differential consideration though this seems less likely based on available clinical information. 9 mm left frontal lesion with edema may represent additional focus of tumor. Curvilinear reduced diffusion anterosuperior to the more superficial right frontal lesion may represent small acute infarction. Electronically Signed   By: Macy Mis M.D.   On: 05/05/2021 10:42     Discharge Instructions: Discharge Instructions     Ambulatory referral to Neurology   Complete by: As directed    An appointment is requested in approximately: 2 weeks.   Diet - low sodium heart healthy   Complete by: As directed    Discharge instructions   Complete by: As directed    Roger Solis, it was a  pleasure taking care of you during your hospital stay. You came in for a seizure and were treated with a medication called Keppra. Please note the following:  1. I have prescribed you Keppra to take at home. Please take 1 tablet twice daily (once in the morning and once at night). 2. I have placed a referral for you to see a neurologist at [facility] for follow up of your seizure. Their office will give you a call to schedule an appointment.  Thank you for giving [place] the opportunity to take care of you!   Increase activity slowly   Complete by: As directed        Signed: Virl Axe, MD 05/05/2021, 5:41 PM   Pager: [phone]

## 2021-05-05 NOTE — Consult Note (Signed)
NEUROLOGY CONSULTATION NOTE   Date of service: May 05, 2021 Patient Name: Roger Solis MRN:  [id number] DOB:  Sep 19, 1955 Reason for consult: "seizures x 3 and brain tumor" Requesting Provider: Noemi Chapel, MD _ _ _   _ __   _ __ _ _  __ __   _ __   __ _  History of Present Illness  Roger Solis is a 66 y.o. male with PMH significant for HTN, brain tumor and previous treatment for brain tumor about a year ago at [place] in [place] but record unavailable. Per family, was unable to resect the tumor and got chemo + radiation + immunotherapy. He comes in with witnessed seizures x 3. His L face and L arm started twitching, he was conscious. he then had secondary generalization and lost consciousness. He did have a seizure immediately after the surgery at [facility] but was felt to be related to surgery and not started on AEDs at that time. No tongue biting and urinary incontinence.    ROS   Constitutional Denies weight loss, fever and chills.   HEENT Denies changes in vision and hearing.   Respiratory Denies SOB and cough.   CV Denies palpitations and CP   GI Denies abdominal pain, nausea, vomiting and diarrhea.   GU Denies dysuria and urinary frequency.   MSK Denies myalgia and joint pain.   Skin Denies rash and pruritus.   Neurological Denies headache and syncope.   Psychiatric Denies recent changes in mood. Denies anxiety and depression.    Past History   Past Medical History:  Diagnosis Date   History of brain cancer    Hypertension    History reviewed. No pertinent surgical history. Family History  Problem Relation Age of Onset   Heart disease Mother    Heart disease Father    Social History   Socioeconomic History   Marital status: Single    Spouse name: Not on file   Number of children: Not on file   Years of education: Not on file   Highest education level: Not on file  Occupational History   Not on file  Tobacco Use   Smoking status: Every Day     Packs/day: 1.00    Types: Cigarettes   Smokeless tobacco: Never  Substance and Sexual Activity   Alcohol use: Yes    Comment: six pack a week   Drug use: Never   Sexual activity: Not on file  Other Topics Concern   Not on file  Social History Narrative   Not on file   Social Determinants of Health   Financial Resource Strain: Not on file  Food Insecurity: Not on file  Transportation Needs: Not on file  Physical Activity: Not on file  Stress: Not on file  Social Connections: Not on file   No Known Allergies  Medications  (Not in a hospital admission)    Vitals   Vitals:   05/05/21 0757 05/05/21 0800 05/05/21 0815 05/05/21 0830  BP:  (!) 162/80 (!) 144/83 (!) 156/75  Pulse:  82 82 76  Resp:  20 19 19   Temp:      TempSrc:      SpO2:  98% 97% 92%  Weight: 82.6 kg     Height: 6\' 1"  (1.854 m)        Body mass index is 24.01 kg/m.  Physical Exam   General: Laying comfortably in bed; in no acute distress.  HENT: Normal oropharynx and mucosa. Normal external appearance of ears  and nose.  Neck: Supple, no pain or tenderness  CV: No JVD. No peripheral edema.  Pulmonary: Symmetric Chest rise. Normal respiratory effort.  Abdomen: Soft to touch, non-tender.  Ext: No cyanosis, edema, or deformity  Skin: No rash. Normal palpation of skin.   Musculoskeletal: Normal digits and nails by inspection. No clubbing.   Neurologic Examination  Mental status/Cognition: Alert, oriented to self, place, month and year, good attention.  Speech/language: Fluent, comprehension intact, object naming intact, repetition intact.  Cranial nerves:   CN II Pupils equal and reactive to light, no VF deficits    CN III,IV,VI EOM intact, no gaze preference or deviation, no nystagmus    CN V normal sensation in V1, V2, and V3 segments bilaterally    CN VII Mild L facial asymmetry.   CN VIII normal hearing to speech    CN IX & X normal palatal elevation, no uvular deviation    CN XI 5/5  head turn and 5/5 shoulder shrug bilaterally    CN XII midline tongue protrusion    Motor:  Muscle bulk: normal, tone normal, pronator drift nonen tremor none Mvmt Root Nerve  Muscle Right Left Comments  SA C5/6 Ax Deltoid 5 5   EF C5/6 Mc Biceps 5 5   EE C6/7/8 Rad Triceps 5 5   WF C6/7 Med FCR     WE C7/8 PIN ECU     F Ab C8/T1 U ADM/FDI 5 5   HF L1/2/3 Fem Illopsoas 5 5   KE L2/3/4 Fem Quad 5 5   DF L4/5 D Peron Tib Ant 5 5   PF S1/2 Tibial Grc/Sol 5 5    Reflexes:  Right Left Comments  Pectoralis      Biceps (C5/6) 2 2   Brachioradialis (C5/6) 2 2    Triceps (C6/7) 2 2    Patellar (L3/4) 2 2    Achilles (S1)      Hoffman      Plantar     Jaw jerk    Sensation:  Light touch Intact throughout   Pin prick    Temperature    Vibration   Proprioception    Coordination/Complex Motor:  - Finger to Nose intact BL - Heel to shin intact BL - Rapid alternating movement are normal - Gait: deferred/  Labs   CBC:  Recent Labs  Lab 05/05/21 0821 05/05/21 0831  WBC 5.4  --   NEUTROABS 3.6  --   HGB 12.6* 12.9*  HCT 39.1 38.0*  MCV 92.9  --   PLT 215  --     Basic Metabolic Panel:  Lab Results  Component Value Date   NA 137 05/05/2021   K 4.3 05/05/2021   CO2 28 05/05/2021   GLUCOSE 90 05/05/2021   BUN 9 05/05/2021   CREATININE 0.90 05/05/2021   CALCIUM 9.2 05/05/2021   GFRNONAA >60 05/05/2021   Lipid Panel: No results found for: LDLCALC HgbA1c: No results found for: HGBA1C Urine Drug Screen: No results found for: LABOPIA, COCAINSCRNUR, LABBENZ, AMPHETMU, THCU, LABBARB  Alcohol Level     Component Value Date/Time   ETH <10 05/05/2021 0821    CT Head without contrast(Personally reviewed):  Post craniotomy changes RIGHT calvaria.   Highattenuation extra-axial at operative site likely a combination of dural thickening and small amount of acute subdural hematoma in combination up to 6 mm thick.   Single small focus of intraparenchymal hemorrhage at  the margin of an area of vasogenic edema question  site of tumor within the RIGHT frontal lobe.   Additional small focus of vasogenic edema LEFT frontal lobe.  MRI Brain(Personally reviewed): Right frontal contiguous enhancing lesions with different appearances. Possible that the superficial lesion represents surgical cavity or original tumor and deeper lesion represents radiation necrosis. An abscess is a differential consideration though this seems less likely based on available clinical information. 9 mm left frontal lesion with edema may represent additional focus of tumor. Curvilinear reduced diffusion anterosuperior to the more superficial right frontal lesion unlikely to represent small acute infarction.  Impression   Arther Heisler is a 66 y.o. male with PMH significant for HTN, brain tumor and s/p ?partial resection + chemo + radiation + immunotherapy who presents with seizures x 3 with secondar generalization.  I reviewed the MRI Brain and appears to show possibly 2 R frontal enhancing lesions vs the superficial lesion being the tumor bed. In addition it demonstrates a possible L frontal lesion with associated vasogenic edema. There is curvilinear DWI changes anterosuperior to the more superficial R frontal lesion. Althou strokes could cause diffuse restriction but it is unlikely to be curvilinear.  Recommendations  - Keppra 2G IV once followed by Keppra 500mg  BID - Routine EEG is unlikely to change our management. I would still continue Keppra even if the routine EEG is negative for any epileptogenic abnormalities given high suspicion for seizures based on history and lesion on the MRI Brain. - Seizure precautions - Ativan 1mg  IV for any seizure activity lasting more than 3 mins. - No driving for 6 months. Full discharge seizure precautions listed below. ______________________________________________________________________   Thank you for the opportunity to take part in  the care of this patient. If you have any further questions, please contact the neurology consultation attending.  Signed,  Donnetta Simpers [facility] Pager Number [phone] _ _ _   _ __   _ __ _ _  __ __   _ __   __ _   Seizure precautions: Per [facility] statutes, patients with seizures are not allowed to drive until they have been seizure-free for six months and cleared by a physician    Use caution when using heavy equipment or power tools. Avoid working on ladders or at heights. Take showers instead of baths. Ensure the water temperature is not too high on the home water heater. Do not go swimming alone. Do not lock yourself in a room alone (i.e. bathroom). When caring for infants or small children, sit down when holding, feeding, or changing them to minimize risk of injury to the child in the event you have a seizure. Maintain good sleep hygiene. Avoid alcohol.    If patient has another seizure, call 911 and bring them back to the ED if: A.  The seizure lasts longer than 5 minutes.      B.  The patient doesn't wake shortly after the seizure or has new problems such as difficulty seeing, speaking or moving following the seizure C.  The patient was injured during the seizure D.  The patient has a temperature over 102 F (39C) E.  The patient vomited during the seizure and now is having trouble breathing    During the Seizure   - First, ensure adequate ventilation and place patients on the floor on their left side  Loosen clothing around the neck and ensure the airway is patent. If the patient is clenching the teeth, do not force the mouth open with any object as this can  cause severe damage - Remove all items from the surrounding that can be hazardous. The patient may be oblivious to what's happening and may not even know what he or she is doing. If the patient is confused and wandering, either gently guide him/her away and block access to outside areas -  Reassure the individual and be comforting - Call 911. In most cases, the seizure ends before EMS arrives. However, there are cases when seizures may last over 3 to 5 minutes. Or the individual may have developed breathing difficulties or severe injuries. If a pregnant patient or a person with diabetes develops a seizure, it is prudent to call an ambulance. - Finally, if the patient does not regain full consciousness, then call EMS. Most patients will remain confused for about 45 to 90 minutes after a seizure, so you must use judgment in calling for help. - Avoid restraints but make sure the patient is in a bed with padded side rails - Place the individual in a lateral position with the neck slightly flexed; this will help the saliva drain from the mouth and prevent the tongue from falling backward - Remove all nearby furniture and other hazards from the area - Provide verbal assurance as the individual is regaining consciousness - Provide the patient with privacy if possible - Call for help and start treatment as ordered by the caregiver    After the Seizure (Postictal Stage)   After a seizure, most patients experience confusion, fatigue, muscle pain and/or a headache. Thus, one should permit the individual to sleep. For the next few days, reassurance is essential. Being calm and helping reorient the person is also of importance.   Most seizures are painless and end spontaneously. Seizures are not harmful to others but can lead to complications such as stress on the lungs, brain and the heart. Individuals with prior lung problems may develop labored breathing and respiratory distress.

## 2021-05-05 NOTE — ED Notes (Signed)
After much discussion between pt family and providers, it as decided to DC the pt.  DC instructions reviewed with pt.  PT verbalized understanding.  PT DC ?

## 2021-05-05 NOTE — ED Notes (Signed)
Patient remains in MRI 

## 2021-05-05 NOTE — Evaluation (Signed)
Physical Therapy Evaluation ?Patient Details ?Name: Roger Solis ?MRN: [id number] ?DOB: 06/14/1955 ?Today's Date: 05/05/2021 ? ?History of Present Illness ? 66 y.o. male presents to [facility] hospital after seizure-like activity observed by daughter at home. Pt has a history of brain tumor, undergoing chemo and radiation. MRI reveals Right frontal contiguous enhancing lesions as well as 9 mm left frontal lesion with edema. PMH includes HTN, HLD, CAD s/p CABG, PAD s/p PCI w/ stents, primary lung cancer with brain metastasis.  ?Clinical Impression ? Pt presents to PT at functional baseline per patient and daughter report. Pt is able to ambulate independently and tolerates multiple dynamic gait challenges without deviation. Pt demonstrates no further PT needs at this time. Acute PT signing off.   ?   ? ?Recommendations for follow up therapy are one component of a multi-disciplinary discharge planning process, led by the attending physician.  Recommendations may be updated based on patient status, additional functional criteria and insurance authorization. ? ?Follow Up Recommendations No PT follow up ? ?  ?Assistance Recommended at Discharge None  ?Patient can return home with the following ?   ? ?  ?Equipment Recommendations None recommended by PT  ?Recommendations for Other Services ?    ?  ?Functional Status Assessment Patient has not had a recent decline in their functional status  ? ?  ?Precautions / Restrictions Precautions ?Precautions: Other (comment) ?Precaution Comments: seizure ?Restrictions ?Weight Bearing Restrictions: No  ? ?  ? ?Mobility ? Bed Mobility ?Overal bed mobility: Independent ?  ?  ?  ?  ?  ?  ?  ?  ? ?Transfers ?Overall transfer level: Independent ?  ?  ?  ?  ?  ?  ?  ?  ?  ?  ? ?Ambulation/Gait ?Ambulation/Gait assistance: Independent ?Gait Distance (Feet): 250 Feet ?Assistive device: None ?Gait Pattern/deviations: WFL(Within Functional Limits) ?Gait velocity: functional ?Gait velocity interpretation:  >2.62 ft/sec, indicative of community ambulatory ?  ?General Gait Details: pt is able to walk backward, change speed, stop abruptly, perform head turns, all without loss of balance or gait deviation ? ?Stairs ?  ?  ?  ?  ?  ? ?Wheelchair Mobility ?  ? ?Modified Rankin (Stroke Patients Only) ?  ? ?  ? ?Balance Overall balance assessment: No apparent balance deficits (not formally assessed) (likely baseline. Pt with 3-4 second SLS bilaterally, reports this normal. 20 second tandem romberg eyes closed) ?  ?  ?  ?  ?  ?  ?  ?  ?  ?  ?  ?  ?  ?  ?  ?  ?  ?  ?   ? ? ? ?Pertinent Vitals/Pain Pain Assessment ?Pain Assessment: No/denies pain  ? ? ?Home Living Family/patient expects to be discharged to:: Private residence ?Living Arrangements: Children ?Available Help at Discharge: Family;Available 24 hours/day ?Type of Home: House ?Home Access: Level entry ?  ?  ?  ?Home Layout: One level ?Home Equipment: None ?   ?  ?Prior Function Prior Level of Function : Independent/Modified Independent ?  ?  ?  ?  ?  ?  ?  ?  ?  ? ? ?Hand Dominance  ?   ? ?  ?Extremity/Trunk Assessment  ? Upper Extremity Assessment ?Upper Extremity Assessment: Overall WFL for tasks assessed ?  ? ?Lower Extremity Assessment ?Lower Extremity Assessment: Overall WFL for tasks assessed ?  ? ?Cervical / Trunk Assessment ?Cervical / Trunk Assessment: Normal  ?Communication  ? Communication: No difficulties  ?  Cognition Arousal/Alertness: Awake/alert ?Behavior During Therapy: [facility] for tasks assessed/performed ?Overall Cognitive Status: Within Functional Limits for tasks assessed ?  ?  ?  ?  ?  ?  ?  ?  ?  ?  ?  ?  ?  ?  ?  ?  ?  ?  ?  ? ?  ?General Comments General comments (skin integrity, edema, etc.): VSS on RA ? ?  ?Exercises    ? ?Assessment/Plan  ?  ?PT Assessment Patient does not need any further PT services  ?PT Problem List   ? ?   ?  ?PT Treatment Interventions     ? ?PT Goals (Current goals can be found in the Care Plan section)  ?  ? ?  ?Frequency    ?  ? ? ?Co-evaluation   ?  ?  ?  ?  ? ? ?  ?AM-PAC PT "6 Clicks" Mobility  ?Outcome Measure Help needed turning from your back to your side while in a flat bed without using bedrails?: None ?Help needed moving from lying on your back to sitting on the side of a flat bed without using bedrails?: None ?Help needed moving to and from a bed to a chair (including a wheelchair)?: None ?Help needed standing up from a chair using your arms (e.g., wheelchair or bedside chair)?: None ?Help needed to walk in hospital room?: None ?Help needed climbing 3-5 steps with a railing? : None ?6 Click Score: 24 ? ?  ?End of Session   ?Activity Tolerance: Patient tolerated treatment well ?Patient left: in bed;with call bell/phone within reach ?Nurse Communication: Mobility status ?  ?  ? ?Time: 0034-9179 ?PT Time Calculation (min) (ACUTE ONLY): 12 min ? ? ?Charges:   PT Evaluation ?$PT Eval Low Complexity: 1 Low ?  ?  ?   ? ? ?Zenaida Niece, PT, DPT ?Acute Rehabilitation ?Pager: [phone] ?Office [phone] ? ? ?Zenaida Niece ?05/05/2021, 5:01 PM ?

## 2021-05-19 ENCOUNTER — Other Ambulatory Visit: Payer: Self-pay

## 2021-05-19 ENCOUNTER — Emergency Department (HOSPITAL_COMMUNITY): Payer: No Typology Code available for payment source

## 2021-05-19 ENCOUNTER — Inpatient Hospital Stay (HOSPITAL_COMMUNITY)
Admission: EM | Admit: 2021-05-19 | Discharge: 2021-05-25 | DRG: 025 | Disposition: A | Discharge status: Home / Self Care | Payer: No Typology Code available for payment source | Attending: Internal Medicine | Admitting: Internal Medicine

## 2021-05-19 ENCOUNTER — Encounter (HOSPITAL_COMMUNITY): Payer: Self-pay

## 2021-05-19 ENCOUNTER — Observation Stay (HOSPITAL_COMMUNITY): Payer: No Typology Code available for payment source

## 2021-05-19 ENCOUNTER — Ambulatory Visit: Payer: Medicare Other | Admitting: Neurology

## 2021-05-19 DIAGNOSIS — R531 Weakness: Principal | ICD-10-CM

## 2021-05-19 DIAGNOSIS — G936 Cerebral edema: Principal | ICD-10-CM | POA: Diagnosis present

## 2021-05-19 DIAGNOSIS — F17210 Nicotine dependence, cigarettes, uncomplicated: Secondary | ICD-10-CM | POA: Diagnosis present

## 2021-05-19 DIAGNOSIS — E871 Hypo-osmolality and hyponatremia: Secondary | ICD-10-CM | POA: Diagnosis present

## 2021-05-19 DIAGNOSIS — C7931 Secondary malignant neoplasm of brain: Secondary | ICD-10-CM | POA: Diagnosis present

## 2021-05-19 DIAGNOSIS — Z955 Presence of coronary angioplasty implant and graft: Secondary | ICD-10-CM

## 2021-05-19 DIAGNOSIS — I739 Peripheral vascular disease, unspecified: Secondary | ICD-10-CM | POA: Diagnosis present

## 2021-05-19 DIAGNOSIS — Z951 Presence of aortocoronary bypass graft: Secondary | ICD-10-CM

## 2021-05-19 DIAGNOSIS — U071 COVID-19: Secondary | ICD-10-CM | POA: Diagnosis present

## 2021-05-19 DIAGNOSIS — Z8249 Family history of ischemic heart disease and other diseases of the circulatory system: Secondary | ICD-10-CM

## 2021-05-19 DIAGNOSIS — R509 Fever, unspecified: Secondary | ICD-10-CM

## 2021-05-19 DIAGNOSIS — C3490 Malignant neoplasm of unspecified part of unspecified bronchus or lung: Secondary | ICD-10-CM | POA: Diagnosis present

## 2021-05-19 DIAGNOSIS — K5900 Constipation, unspecified: Secondary | ICD-10-CM | POA: Diagnosis present

## 2021-05-19 DIAGNOSIS — J449 Chronic obstructive pulmonary disease, unspecified: Secondary | ICD-10-CM | POA: Diagnosis present

## 2021-05-19 DIAGNOSIS — R339 Retention of urine, unspecified: Secondary | ICD-10-CM | POA: Diagnosis present

## 2021-05-19 DIAGNOSIS — R29810 Facial weakness: Secondary | ICD-10-CM | POA: Diagnosis present

## 2021-05-19 DIAGNOSIS — R066 Hiccough: Secondary | ICD-10-CM | POA: Diagnosis present

## 2021-05-19 DIAGNOSIS — G8194 Hemiplegia, unspecified affecting left nondominant side: Secondary | ICD-10-CM | POA: Diagnosis present

## 2021-05-19 DIAGNOSIS — Z66 Do not resuscitate: Secondary | ICD-10-CM | POA: Diagnosis present

## 2021-05-19 DIAGNOSIS — I639 Cerebral infarction, unspecified: Secondary | ICD-10-CM | POA: Diagnosis present

## 2021-05-19 DIAGNOSIS — R2990 Unspecified symptoms and signs involving the nervous system: Secondary | ICD-10-CM

## 2021-05-19 DIAGNOSIS — I2510 Atherosclerotic heart disease of native coronary artery without angina pectoris: Secondary | ICD-10-CM | POA: Diagnosis present

## 2021-05-19 LAB — COMPREHENSIVE METABOLIC PANEL
ALT: 35 U/L (ref 0–44)
AST: 53 U/L — ABNORMAL HIGH (ref 15–41)
Albumin: 3.7 g/dL (ref 3.5–5.0)
Alkaline Phosphatase: 85 U/L (ref 38–126)
Anion gap: 13 (ref 5–15)
BUN: 12 mg/dL (ref 8–23)
CO2: 22 mmol/L (ref 22–32)
Calcium: 9 mg/dL (ref 8.9–10.3)
Chloride: 97 mmol/L — ABNORMAL LOW (ref 98–111)
Creatinine, Ser: 1.06 mg/dL (ref 0.61–1.24)
GFR, Estimated: >60 mL/min (ref >60)
Glucose, Bld: 85 mg/dL (ref 70–99)
Potassium: 4.2 mmol/L (ref 3.5–5.1)
Sodium: 132 mmol/L — ABNORMAL LOW (ref 135–145)
Total Bilirubin: 0.7 mg/dL (ref 0.3–1.2)
Total Protein: 7 g/dL (ref 6.5–8.1)

## 2021-05-19 LAB — DIFFERENTIAL
Abs Immature Granulocytes: 0.02 10*3/uL (ref 0.00–0.07)
Basophils Absolute: 0 10*3/uL (ref 0.0–0.1)
Basophils Relative: 1 %
Eosinophils Absolute: 0 10*3/uL (ref 0.0–0.5)
Eosinophils Relative: 0 %
Immature Granulocytes: 1 %
Lymphocytes Relative: 23 %
Lymphs Abs: 1 10*3/uL (ref 0.7–4.0)
Monocytes Absolute: 0.9 10*3/uL (ref 0.1–1.0)
Monocytes Relative: 20 %
Neutro Abs: 2.5 10*3/uL (ref 1.7–7.7)
Neutrophils Relative %: 55 %

## 2021-05-19 LAB — CBC
HCT: 41.8 % (ref 39.0–52.0)
Hemoglobin: 13.4 g/dL (ref 13.0–17.0)
MCH: 30 pg (ref 26.0–34.0)
MCHC: 32.1 g/dL (ref 30.0–36.0)
MCV: 93.7 fL (ref 80.0–100.0)
Platelets: 177 10*3/uL (ref 150–400)
RBC: 4.46 MIL/uL (ref 4.22–5.81)
RDW: 16.5 % — ABNORMAL HIGH (ref 11.5–15.5)
WBC: 4.4 10*3/uL (ref 4.0–10.5)
nRBC: 0 % (ref 0.0–0.2)

## 2021-05-19 LAB — RESP PANEL BY RT-PCR (FLU A&B, COVID) ARPGX2
Influenza A by PCR: NEGATIVE
Influenza B by PCR: NEGATIVE
SARS Coronavirus 2 by RT PCR: POSITIVE — AB

## 2021-05-19 LAB — URINALYSIS, ROUTINE W REFLEX MICROSCOPIC
Bilirubin Urine: NEGATIVE
Glucose, UA: NEGATIVE mg/dL
Ketones, ur: 5 mg/dL — AB
Leukocytes,Ua: NEGATIVE
Nitrite: NEGATIVE
Protein, ur: NEGATIVE mg/dL
Specific Gravity, Urine: 1.018 (ref 1.005–1.030)
pH: 5 (ref 5.0–8.0)

## 2021-05-19 LAB — I-STAT CHEM 8, ED
BUN: 13 mg/dL (ref 8–23)
Calcium, Ion: 1.04 mmol/L — ABNORMAL LOW (ref 1.15–1.40)
Chloride: 97 mmol/L — ABNORMAL LOW (ref 98–111)
Creatinine, Ser: 0.9 mg/dL (ref 0.61–1.24)
Glucose, Bld: 84 mg/dL (ref 70–99)
HCT: 44 % (ref 39.0–52.0)
Hemoglobin: 15 g/dL (ref 13.0–17.0)
Potassium: 4.1 mmol/L (ref 3.5–5.1)
Sodium: 132 mmol/L — ABNORMAL LOW (ref 135–145)
TCO2: 25 mmol/L (ref 22–32)

## 2021-05-19 LAB — PROTIME-INR
INR: 1 (ref 0.8–1.2)
Prothrombin Time: 13.5 seconds (ref 11.4–15.2)

## 2021-05-19 LAB — RAPID URINE DRUG SCREEN, HOSP PERFORMED
Amphetamines: NOT DETECTED
Barbiturates: NOT DETECTED
Benzodiazepines: NOT DETECTED
Cocaine: NOT DETECTED
Opiates: NOT DETECTED
Tetrahydrocannabinol: NOT DETECTED

## 2021-05-19 LAB — APTT: aPTT: 33 seconds (ref 24–36)

## 2021-05-19 LAB — ETHANOL: Alcohol, Ethyl (B): <10 mg/dL (ref <10)

## 2021-05-19 LAB — CBG MONITORING, ED
Glucose-Capillary: 88 mg/dL (ref 70–99)
Glucose-Capillary: 83 mg/dL (ref 70–99)

## 2021-05-19 LAB — LACTIC ACID, PLASMA
Lactic Acid, Venous: 1.8 mmol/L (ref 0.5–1.9)
Lactic Acid, Venous: 0.9 mmol/L (ref 0.5–1.9)

## 2021-05-19 IMAGING — DX DG CHEST 1V PORT
1 series · 1 of 1 positions shown · non-contrast
Comparison: None.

CLINICAL DATA: Pt arrived POV from home c/o left sided flaccidness
and a left facial droop. Pt's LKW was 9am. Pt does have brain cancer
and is getting treatments at the [facility]HOSP[facility]

EXAM:
PORTABLE CHEST 1 VIEW

[chest ap]
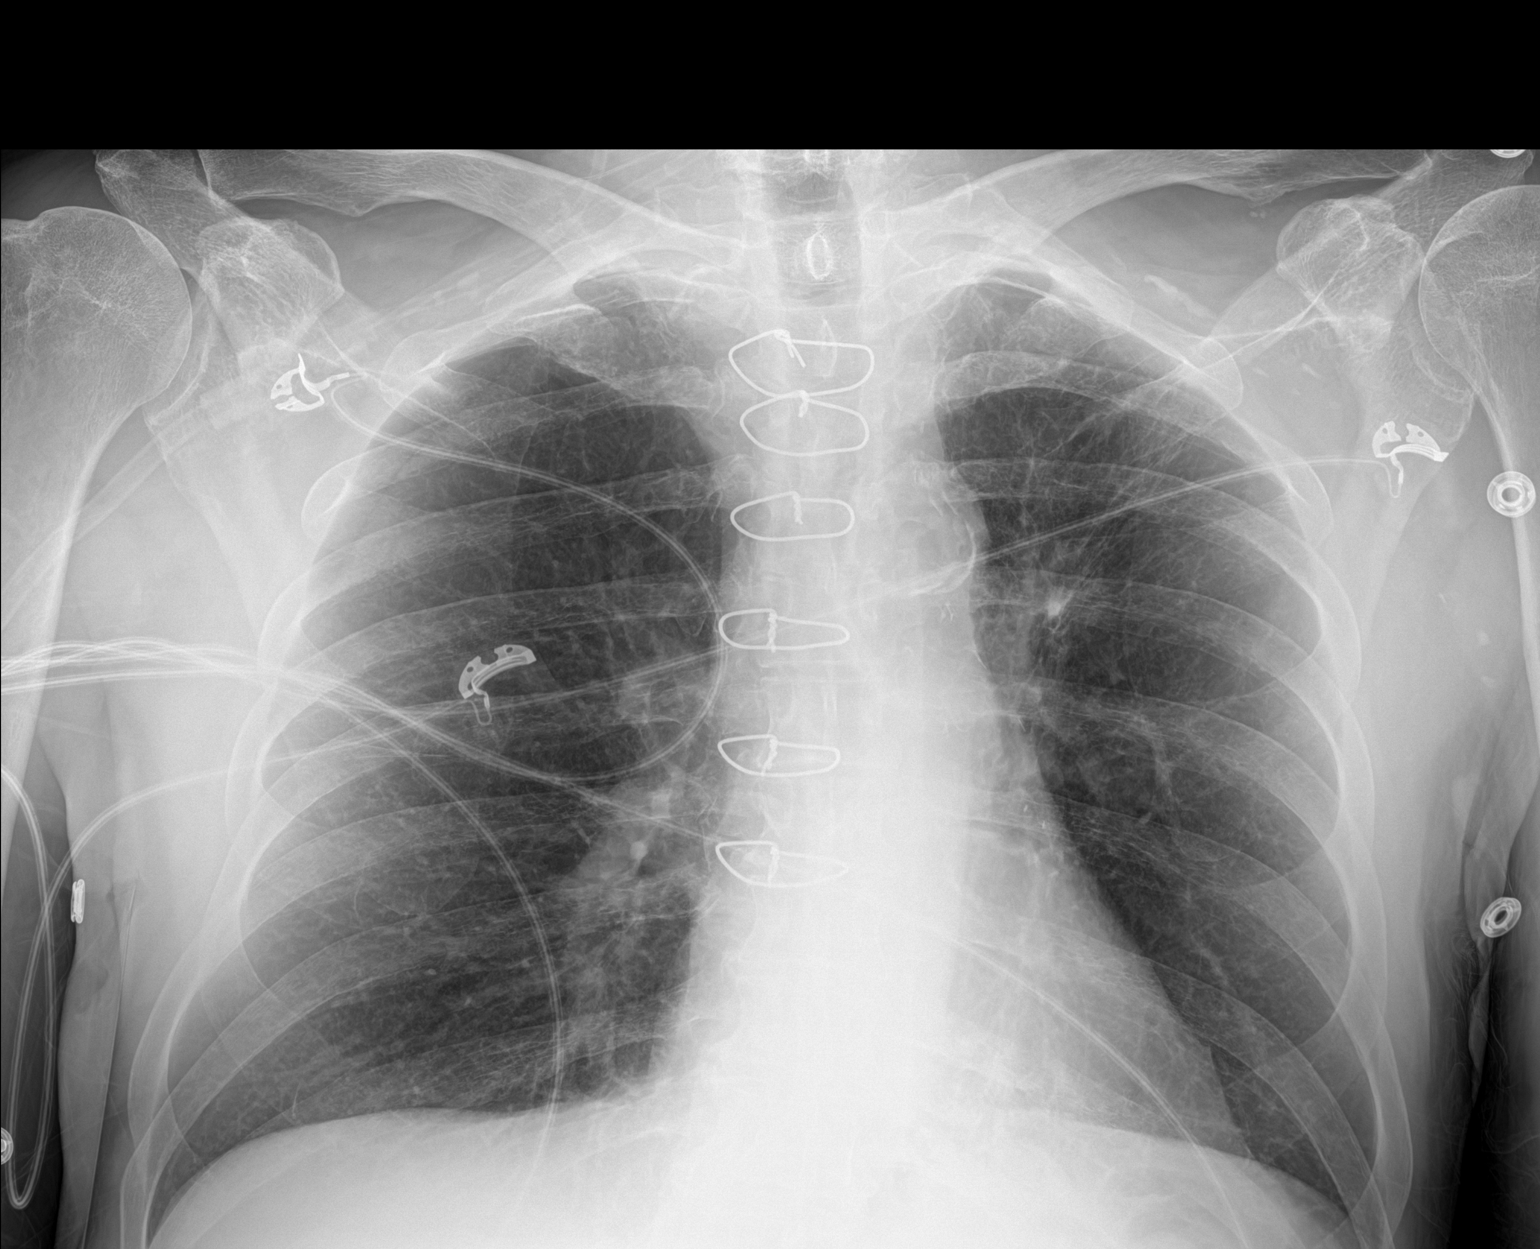

[1 of 1 positions shown; findings below may reference images not displayed]

FINDINGS: Previous median sternotomy. Cardiac silhouette is normal in size and
configuration. No mediastinal or hilar masses.

Clear lungs.  No pleural effusion or pneumothorax.

Skeletal structures are grossly intact.
IMPRESSION: No active disease.

## 2021-05-19 IMAGING — MR MR HEAD WO/W CM
14 of 16 series · 41 of 48 positions shown · IV contrast (Contrast agent)
Comparison: MRI of the brain [DATE]; head CT [DATE].

CLINICAL DATA: Edema.  History of brain tumor.

EXAM:
MRI HEAD WITHOUT AND WITH CONTRAST
TECHNIQUE: Multiplanar, multiecho pulse sequences of the brain and surrounding
structures were obtained without and with intravenous contrast.
CONTRAST:  8.5mL GADAVIST GADOBUTROL 1 MMOL/ML IV SOLN

[Series 5: DWI · axial · 3.0mm · 0.88mm/px · z∈[-104,+52]mm · 8 of 106 slices shown (1 of 4)]
[im 1/106]
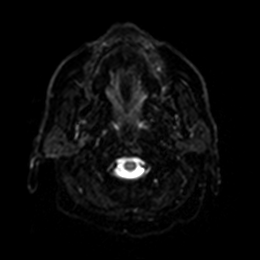
[im 16/106]
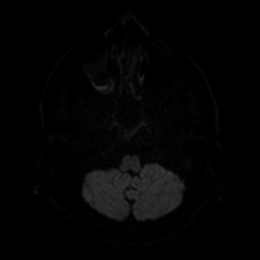
[im 31/106]
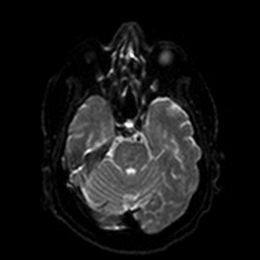
[im 46/106]
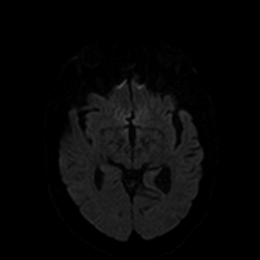
[im 61/106]
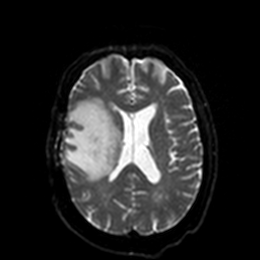
[im 76/106]
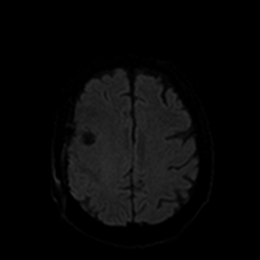
[im 91/106]
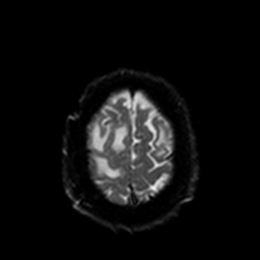
[im 106/106]
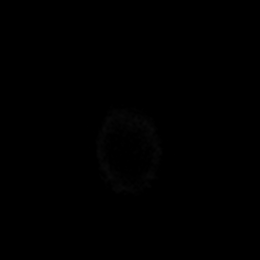

[Series 6: DWI · axial · 3.0mm · 0.88mm/px · z∈[-104,+52]mm · 3 of 53 slices shown (2 of 4)]
[im 1/53]
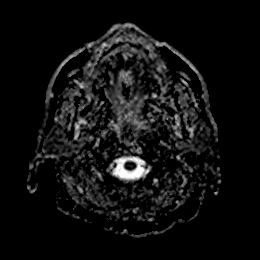
[im 27/53]
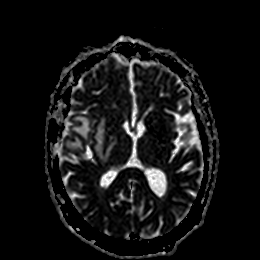
[im 53/53]
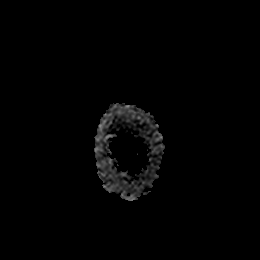

[Series 7: DWI · coronal · 4.0mm · 0.88mm/px · 4 of 68 slices shown (3 of 4)]
[im 1/68]
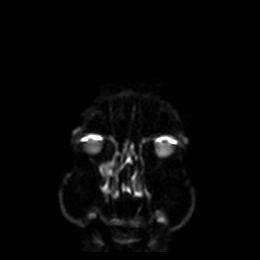
[im 23/68]
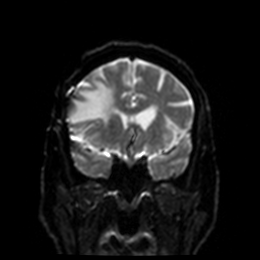
[im 45/68]
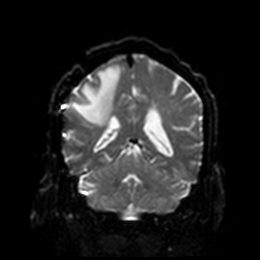
[im 68/68]
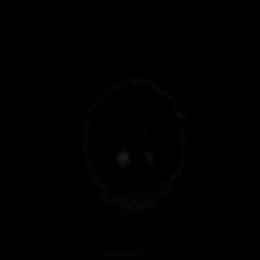

[Series 8: DWI · coronal · 4.0mm · 0.88mm/px · 2 of 34 slices shown (4 of 4)]
[im 1/34]
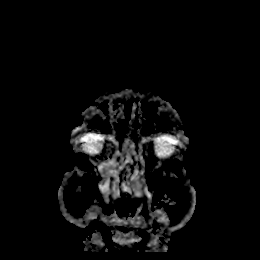
[im 34/34]
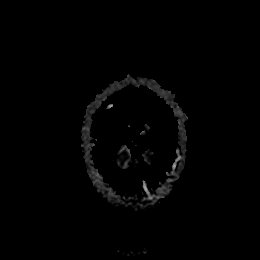

[Series 9: T1 · sagittal · 5.0mm · 0.75mm/px · 2 of 23 slices shown]
[im 1/23]
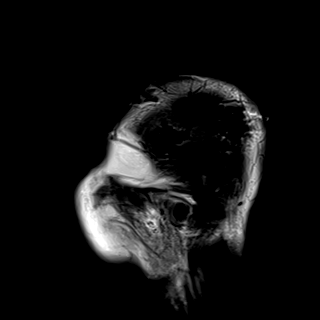
[im 23/23]
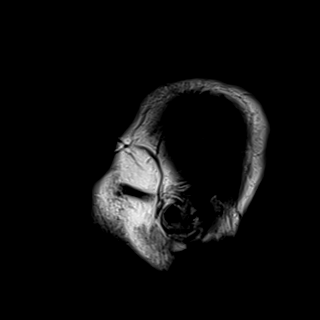

[Series 10: T2 · axial · 5.0mm · 0.72mm/px · z∈[-107,+55]mm · 2 of 28 slices shown]
[im 1/28]
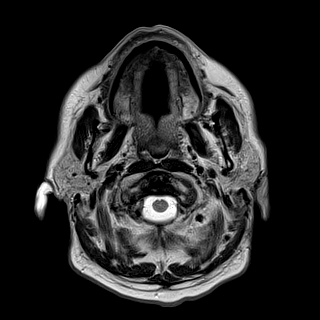
[im 28/28]
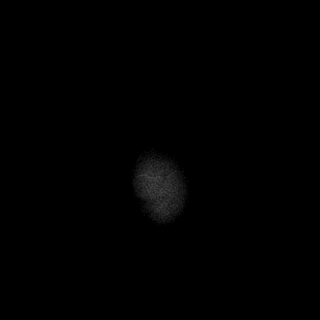

[Series 11: FLAIR · axial · 5.0mm · 0.45mm/px · z∈[-106,+56]mm · 2 of 28 slices shown]
[im 1/28]
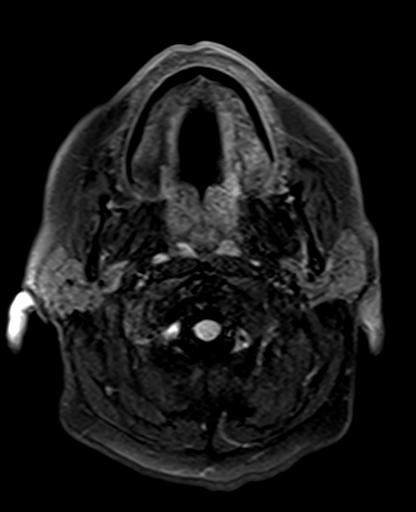
[im 28/28]
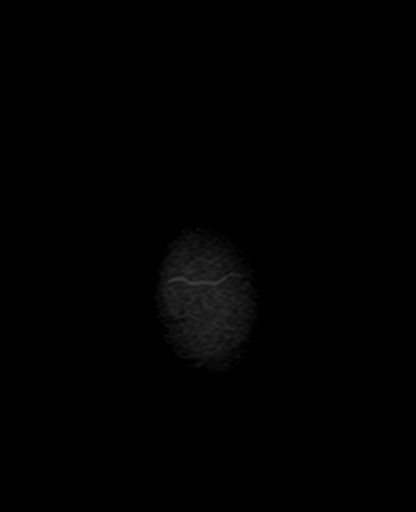

[Series 12: mag_images · axial · 3.0mm · 0.90mm/px · z∈[-101,+51]mm · 3 of 52 slices shown]
[im 1/52]
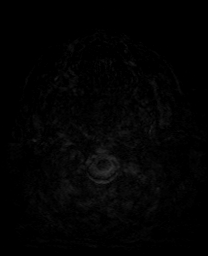
[im 26/52]
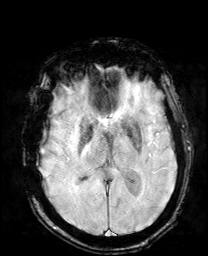
[im 52/52]
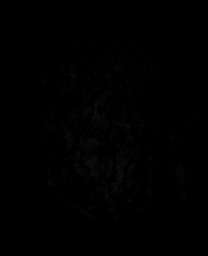

[Series 13: pha_images · axial · 3.0mm · 0.90mm/px · z∈[-95,+45]mm · 3 of 48 slices shown]
[im 1/48]
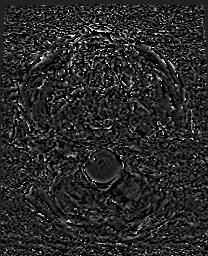
[im 24/48]
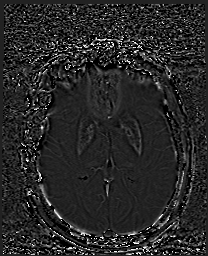
[im 48/48]
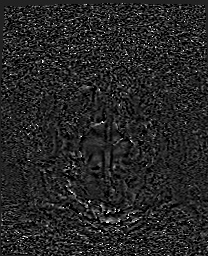

[Series 14: swi_images · axial · 3.0mm · 0.90mm/px · z∈[-101,+51]mm · 3 of 52 slices shown]
[im 1/52]
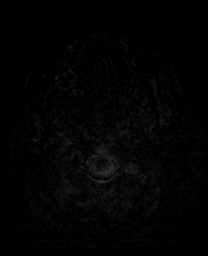
[im 26/52]
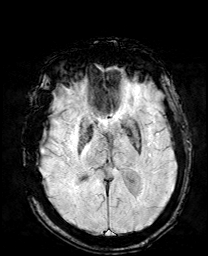
[im 52/52]
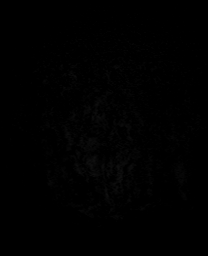

[Series 15: mip_images(sw) · axial · 24.0mm · 0.90mm/px · z∈[-91,+41]mm · 3 of 45 slices shown]
[im 1/45]
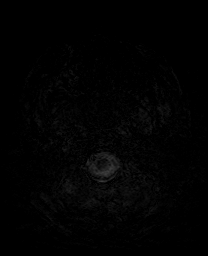
[im 23/45]
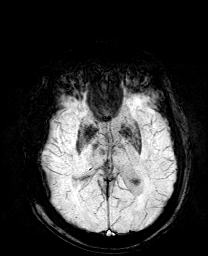
[im 45/45]
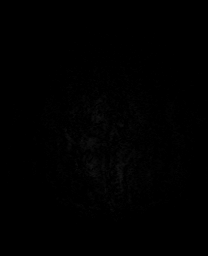

[Series 17: T2 post-contrast · coronal · 5.0mm · 0.72mm/px · 2 of 30 slices shown]
[im 1/30]
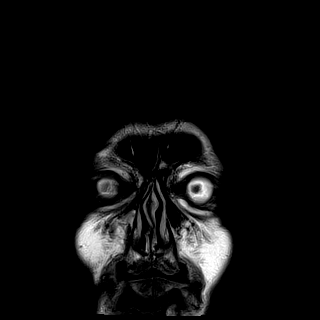
[im 30/30]
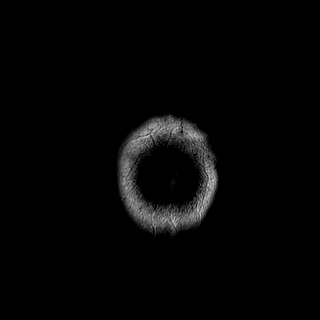

[Series 19: T1 post-contrast · coronal · 5.0mm · 0.34mm/px · 2 of 30 slices shown (1 of 2)]
[im 1/30]
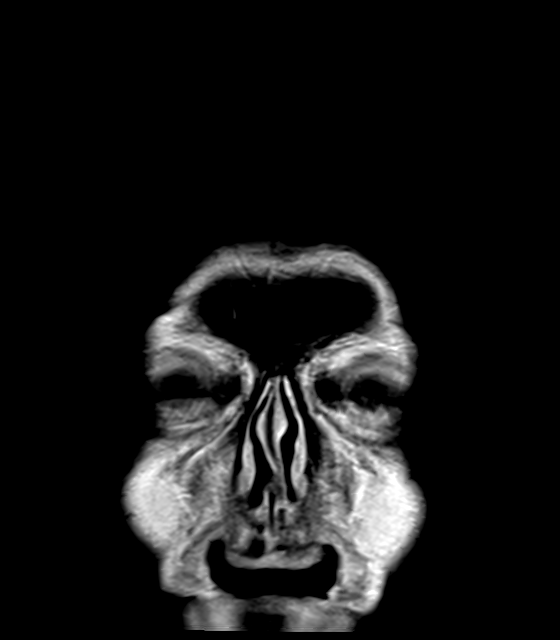
[im 30/30]
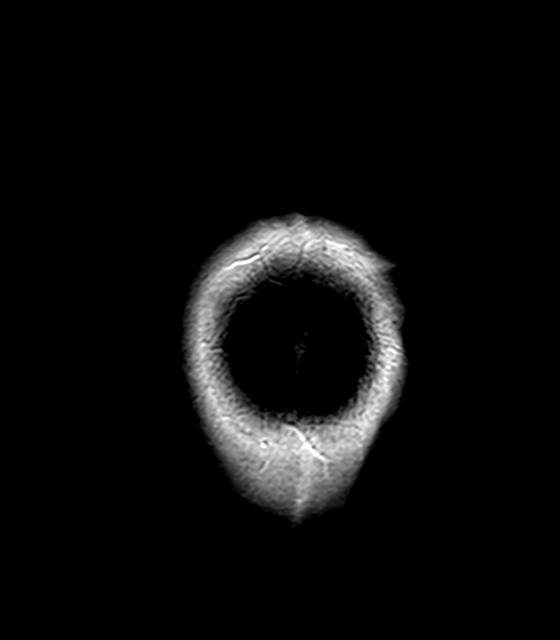

[Series 20: T1 post-contrast · sagittal · 5.0mm · 0.72mm/px · 2 of 23 slices shown (2 of 2)]
[im 1/23]
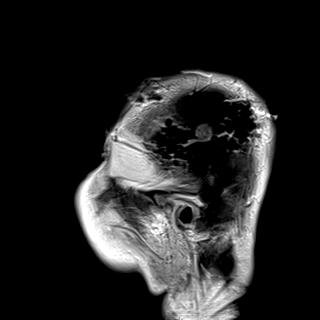
[im 23/23]
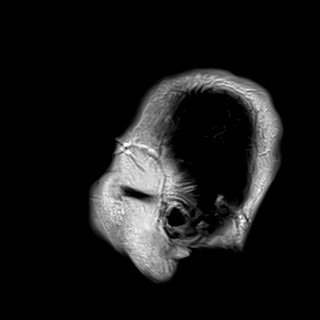

[41 of 48 positions shown; findings below may reference images not displayed]

FINDINGS: Brain: Again seen is 2 nodular lesions in the right hemisphere with
interval increase in size of the lesion with restricted diffusion
within the right corona radiata, now measuring approximately 2.4 x
2.1 cm, compared to 1.9 x 1.4 cm on prior. The lesion now has a
well-defined T2 hypointense halo and persistent peripheral contrast
enhancement. There is also significant interval increase of the
surrounding vasogenic edema with mass effect on the adjacent
cerebral sulci and right lateral ventricle without evidence of
significant midline shift or hydrocephalus.

The second adjacent lesion with thin peripheral contrast enhancement
appear relatively unchanged.

The third solid enhancing lesion in the left middle frontal gyrus
appear similar in size to prior MRI at 9 mm with stable surrounding
vasogenic edema and faint internal restricted diffusion.

Vascular: Normal flow voids.

Skull and upper cervical spine: Postsurgical changes from right side
craniotomy. No marrow lesion identified.

Sinuses/Orbits: Mucosal thickening scattered throughout the
paranasal sinuses with fluid level within the right maxillary sinus.
Correlate clinically for acute sinusitis. The orbits are maintained.

Other: Bilateral mastoid effusion.
IMPRESSION: 1. Interval increase in size of one of the 2 lesions previously
described in the right corona radiata, corresponding to the lesion
with internal restricted diffusion. The lesion now shows
well-defined T2 hypointense halo and increased surrounding vasogenic
edema. Findings are concerning for abscess.
2. Left frontal lesion could neoplastic deposit versus focal
cerebritis.
3. Inflammatory paranasal sinus disease. Correlate clinically for
acute sinusitis.

## 2021-05-19 IMAGING — CT CT HEAD W/O CM
4 series · 16 of 47 positions shown, 18 images · non-contrast
Comparison: Head CT and MRI [DATE]

CLINICAL DATA: Neuro deficit, acute, stroke suspected. Facial
droop. Left-sided weakness. History of brain tumor.



[Series 2: head without · axial · non-contrast · 0.43mm/px · z∈[-92,+43]mm · 7 of 37 slices shown, 9 images]
[im 5/37  brain]
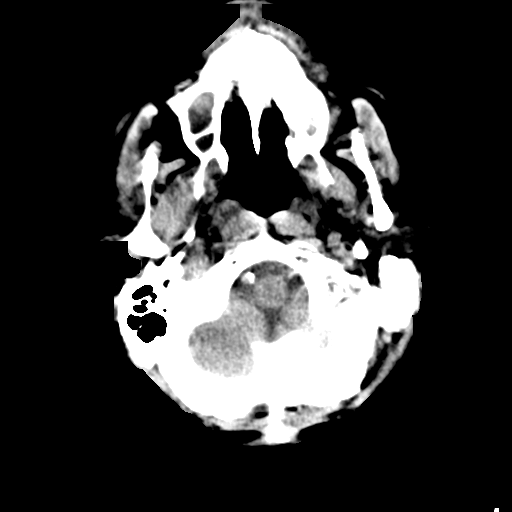
[im 5/37  bone]
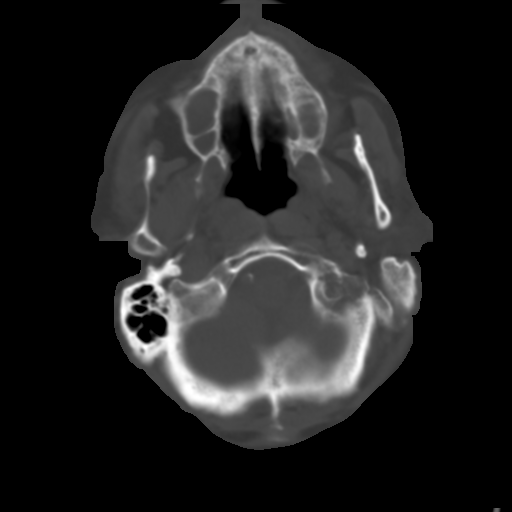
[im 10/37  brain]
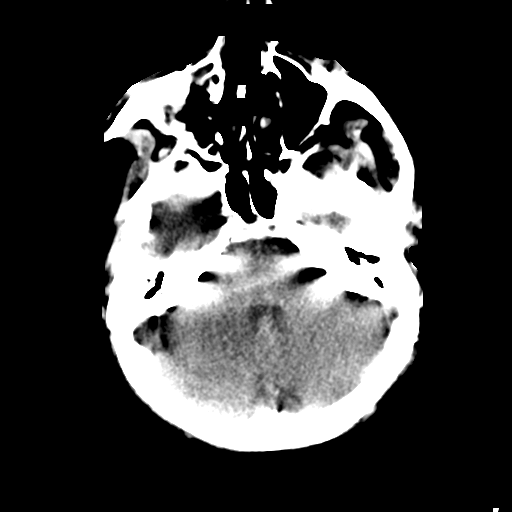
[im 14/37  brain]
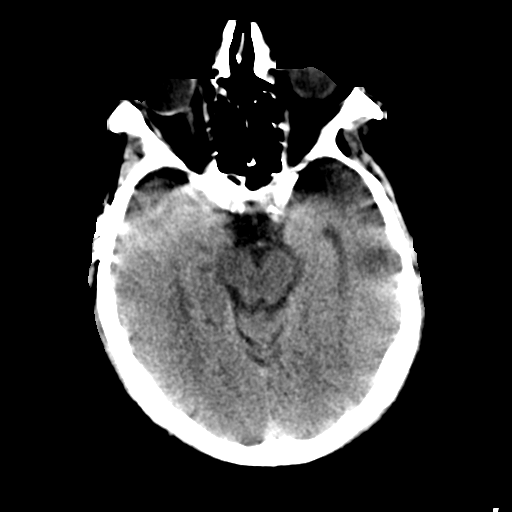
[im 19/37  brain]
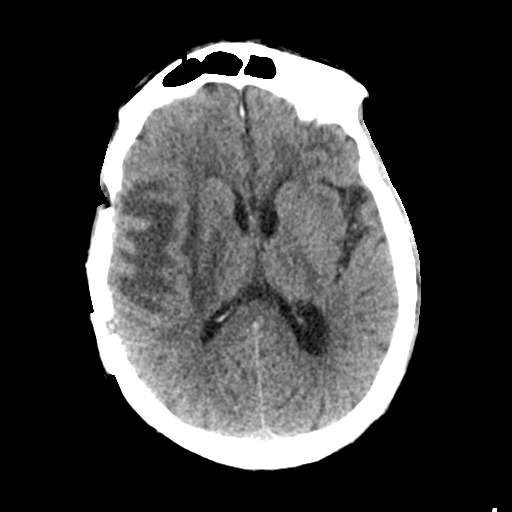
[im 23/37  brain]
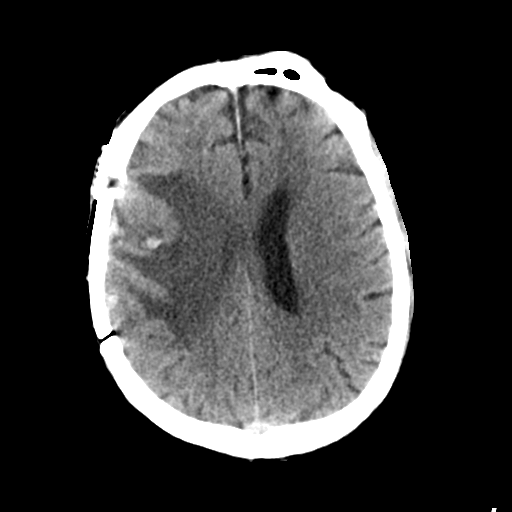
[im 23/37  bone]
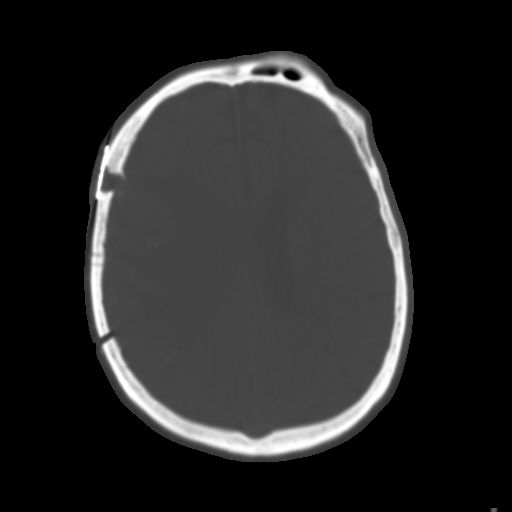
[im 28/37  brain]
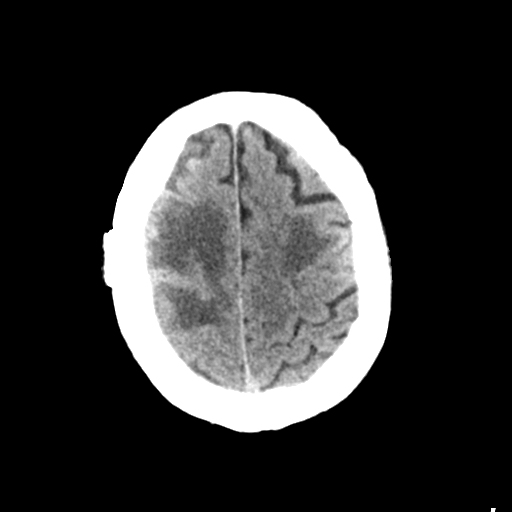
[im 32/37  brain]
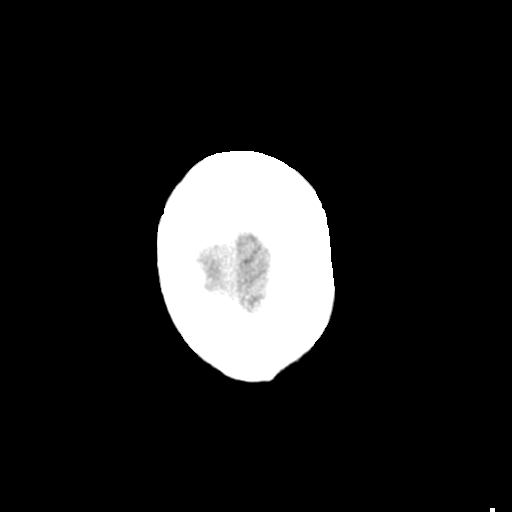

[Series 3: ax head bone · axial · 0.43mm/px · z∈[-104,-69]mm · 3 of 90 slices shown]
[im 9/90  bone]
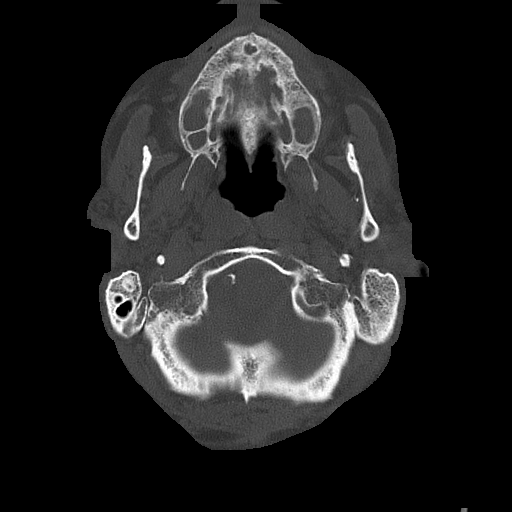
[im 18/90  bone]
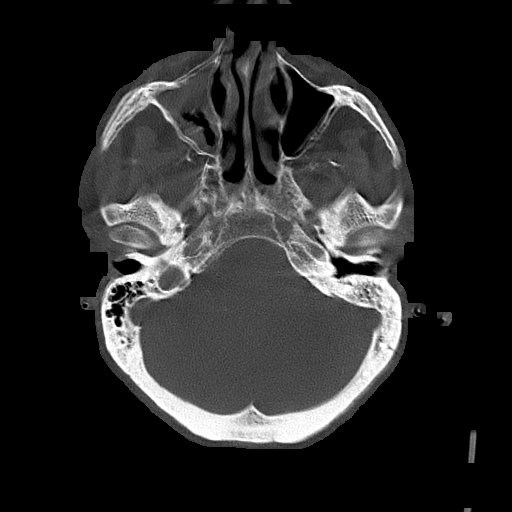
[im 27/90  bone]
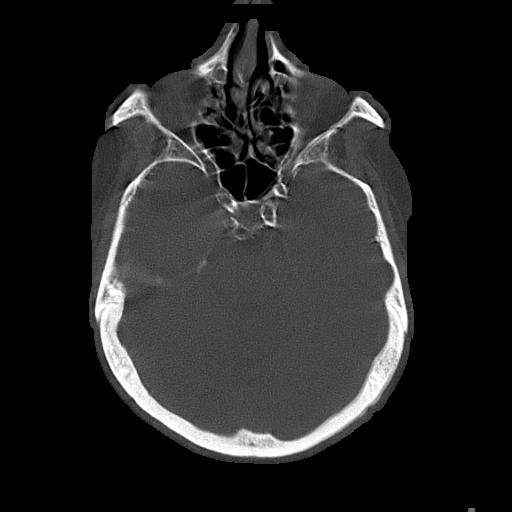

[Series 4: head without cor · coronal · non-contrast · 0.35mm/px · 3 of 73 slices shown]
[im 25/73  brain]
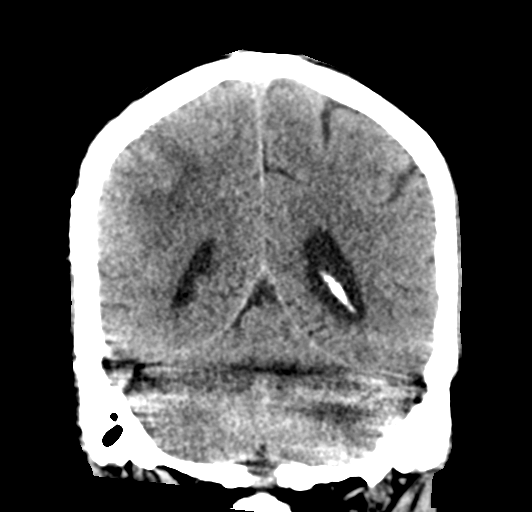
[im 33/73  brain]
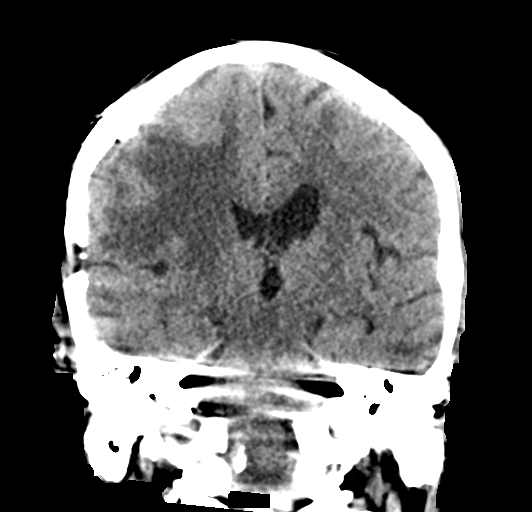
[im 41/73  brain]
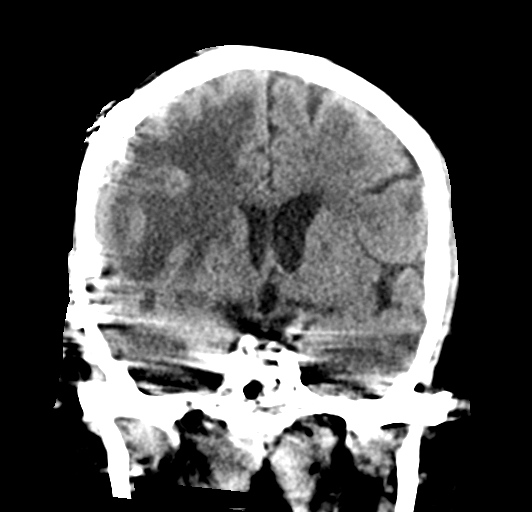

[Series 5: head without sag · sagittal · non-contrast · 0.35mm/px · 3 of 62 slices shown]
[im 21/62  brain]
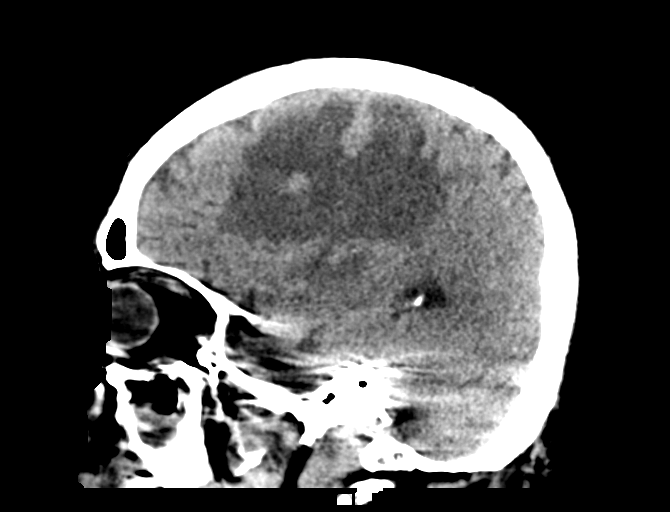
[im 31/62  brain]
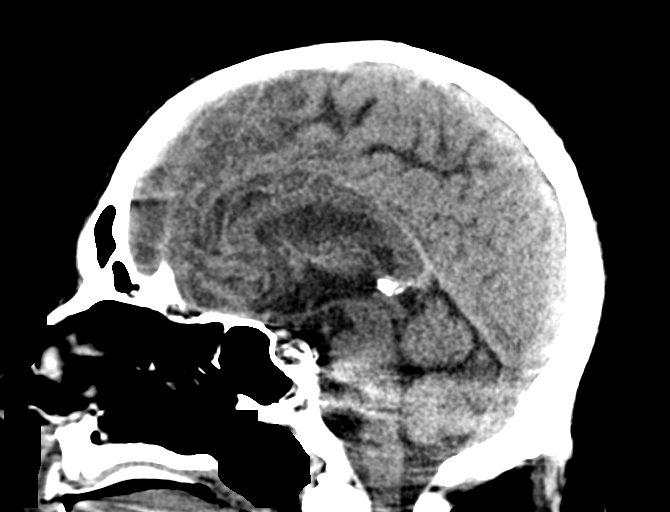
[im 41/62  brain]
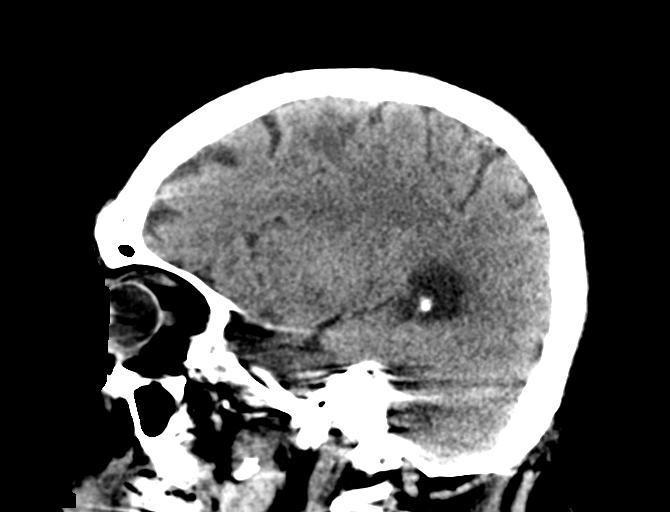

[16 of 47 positions shown; findings below may reference images not displayed]

FINDINGS: Brain: Sequelae of right pterional craniotomy are again identified
with unchanged underlying dural thickening and small volume
extra-axial fluid there is a large region of vasogenic edema
centered in the posterior right frontal lobe which has progressed
from the prior studies, extends into the deep white matter tracts at
the level of the basal ganglia, and results in partial effacement of
the right lateral ventricle with trace leftward midline shift. A
subcentimeter hyperdense focus within this region of edema is
unchanged and may reflect calcification or hemorrhage. A small
region of edema in the left frontal lobe has mildly increased as
well. No new intracranial hemorrhage or acute cortically based
infarct is identified.

Vascular: Calcified atherosclerosis at the skull base. No hyperdense
vessel.

Skull: No suspicious osseous lesion.

Sinuses/Orbits: Mucosal thickening in the right greater than left
maxillary sinuses. Unchanged left mastoid effusion. Unremarkable
orbits.

Other: None.
IMPRESSION: Worsening edema in the right greater than left cerebral hemispheres
with new trace leftward midline shift.

## 2021-05-19 MED ORDER — LEVETIRACETAM IN NACL 500 MG/100ML IV SOLN
500.0000 mg | Freq: Two times a day (BID) | INTRAVENOUS | Status: DC
  Administered 2021-05-19 – 2021-05-23 (×8): 500 mg via INTRAVENOUS
  Filled 2021-05-19 (×9): qty 100

## 2021-05-19 MED ORDER — ACETAMINOPHEN 650 MG RE SUPP: 650.0000 mg | Freq: Four times a day (QID) | RECTAL | Status: DC | PRN

## 2021-05-19 MED ORDER — ACETAMINOPHEN 325 MG PO TABS: 650.0000 mg | ORAL_TABLET | Freq: Four times a day (QID) | ORAL | Status: DC | PRN

## 2021-05-19 MED ORDER — DEXAMETHASONE SODIUM PHOSPHATE 10 MG/ML IJ SOLN
6.0000 mg | Freq: Four times a day (QID) | INTRAMUSCULAR | Status: DC
  Administered 2021-05-20 – 2021-05-23 (×15): 6 mg via INTRAVENOUS
  Filled 2021-05-19 (×15): qty 1

## 2021-05-19 MED ORDER — DEXAMETHASONE SODIUM PHOSPHATE 10 MG/ML IJ SOLN
10.0000 mg | Freq: Once | INTRAMUSCULAR | Status: AC
  Administered 2021-05-19: 10 mg via INTRAVENOUS
  Filled 2021-05-19: qty 1

## 2021-05-19 MED ORDER — SENNOSIDES-DOCUSATE SODIUM 8.6-50 MG PO TABS: 1.0000 | ORAL_TABLET | Freq: Every evening | ORAL | Status: DC | PRN

## 2021-05-19 MED ORDER — NICOTINE 21 MG/24HR TD PT24
21.0000 mg | MEDICATED_PATCH | Freq: Every day | TRANSDERMAL | Status: DC
  Administered 2021-05-19 – 2021-05-25 (×7): 21 mg via TRANSDERMAL
  Filled 2021-05-19 (×7): qty 1

## 2021-05-19 MED ORDER — ENOXAPARIN SODIUM 40 MG/0.4ML IJ SOSY
40.0000 mg | PREFILLED_SYRINGE | INTRAMUSCULAR | Status: DC
  Administered 2021-05-19 – 2021-05-24 (×6): 40 mg via SUBCUTANEOUS
  Filled 2021-05-19 (×7): qty 0.4

## 2021-05-19 MED ORDER — GADOBUTROL 1 MMOL/ML IV SOLN
8.5000 mL | Freq: Once | INTRAVENOUS | Status: AC | PRN
  Administered 2021-05-19: 8.5 mL via INTRAVENOUS

## 2021-05-19 MED ORDER — ONDANSETRON HCL 4 MG PO TABS: 4.0000 mg | ORAL_TABLET | Freq: Four times a day (QID) | ORAL | Status: DC | PRN

## 2021-05-19 MED ORDER — SODIUM CHLORIDE 0.9 % IV SOLN: Freq: Once | INTRAVENOUS | Status: AC

## 2021-05-19 MED ORDER — ENOXAPARIN SODIUM 40 MG/0.4ML IJ SOSY: 40.0000 mg | PREFILLED_SYRINGE | INTRAMUSCULAR | Status: DC

## 2021-05-19 MED ORDER — ONDANSETRON HCL 4 MG/2ML IJ SOLN
4.0000 mg | Freq: Four times a day (QID) | INTRAMUSCULAR | Status: DC | PRN
  Administered 2021-05-20: 4 mg via INTRAVENOUS
  Filled 2021-05-19: qty 2

## 2021-05-19 NOTE — Plan of Care (Signed)

## 2021-05-19 NOTE — ED Provider Notes (Signed)
Patient signed out to me at 4 PM.  History of brain cancer follows with the [facility].  Currently on immunotherapy.  Was recently admitted for seizures 2 weeks ago.  Started on Keppra twice daily.  This morning patient was found to have left-sided facial droop, left upper arm and left lower leg weakness.  Was not made a code stroke upon arrival after original team talked with neurology given his history.  CT scan was obtained that showed worsening edema around tumor site in the brain.  No bleed.  Patient was found to have a low-grade temperature of 100.5.  There does not appear to be an infectious process at this time.  I have talked with Dr. Ellene Route with neurosurgery who recommends starting the patient on IV Decadron.  Loading dose of 10 mg now and then 6 mg every 6.  He recommends getting a new MRI with and without and he will follow along in consultation.  No need for acute surgery at this time.  I touch base with Dr. Leonel Ramsay with neurosurgery who recommends keeping Keppra 500 twice daily and order EEG if any concern for seizure activity which he has not had any under my care.  Patient has no leukocytosis.  No significant anemia or electrolyte abnormality.  No signs of pneumonia on chest x-ray.  Will admit to internal medicine.  Fever could be secondary to cancer process.  At this time he does not appear to be septic.  There is no source.  Is not having any headache or neck pain and no concern for meningitis or other acute infectious process at this time.  Urinalysis pending. ? ?This chart was dictated using voice recognition software.  Despite best efforts to proofread,  errors can occur which can change the documentation meaning.  ?  Lennice Sites, DO ?05/19/21 1719 ? ?

## 2021-05-19 NOTE — ED Provider Triage Note (Signed)
Emergency Medicine Provider Triage Evaluation Note ? ?Roger Solis , a 66 y.o. male  was evaluated in triage.  Pt complains of left-sided weakness. ?History is primarily obtained by his daughter. ?He is currently under treatment for brain and lung cancer.  He had immunotherapy yesterday.  His last known well was 9 AM today when he went down for a nap.  Since then he has had increasing weakness in the left arm and leg with left facial droop.  Daughter feels like his mental status is baseline and he is cognitively intact.   ? ?Physical Exam  ?BP 119/84 (BP Location: Right Arm)   Pulse (!) 111   Temp (!) 100.5 ?F (38.1 ?C) (Oral)   Resp 18   Ht 6\' 1"  (1.854 m)   Wt 84.4 kg   SpO2 95%   BMI 24.54 kg/m?  ?Gen:   Awake, no distress   ?Resp:  Normal effort when he holds his head up, however when he rests his head on his chest he has decreased breathing ?MSK:   Left-sided arm and leg weakness, ?Other:  Patient is able to state his name, denies pain, speech is slurred. Frequent hiccups.  ? ?Medical Decision Making  ?Medically screening exam initiated at 2:59 PM.  Appropriate orders placed.  Roger Solis was informed that the remainder of the evaluation will be completed by another provider, this initial triage assessment does not replace that evaluation, and the importance of remaining in the ED until their evaluation is complete. ?1456: I called Dr. Leonel Ramsay on-call for neurology.  We discussed patient's symptoms and presentation along with his history.  He is outside of the tPA window, however given his slow speech I am unable to fully assess for vision loss therefore I asked if we needed to call a LVO. ?Dr. Leonel Ramsay advised not calling a code stroke at this time and to work-up patient and evaluate him. ? ?Patient is febrile and tachycardic additionally therefore will check cultures and labs. ? ? ?I called CT scan to expedite his CT scan on his head. ? ?While in triage patient's daughter called out for  help.  Patient had symptoms had down and briefly gone apneic.  When his airway was opened he resumed breathing, he was able to tell me his name.  He was taken immediately to a room by myself and tech report given to Dr. Gustavus Messing  at bedside. ?  ?Lorin Glass, PA-C ?05/19/21 1526 ? ?

## 2021-05-19 NOTE — ED Triage Notes (Signed)
Pt arrived POV from home c/o left sided flaccidness and a left facial droop. Pt's LKW was 9am. Pt does have brain cancer and is getting treatments at the [facility].  ?

## 2021-05-19 NOTE — ED Provider Notes (Addendum)
[facility] EMERGENCY DEPARTMENT Provider Note   CSN: [id number] Arrival date & time: 05/19/21  1444     History  Chief Complaint  Patient presents with   Facial Droop   left sided weakness     Roger Solis is a 66 y.o. male.  The history is provided by the patient, the spouse and medical records. No language interpreter was used.  Neurologic Problem This is a new problem. The current episode started 6 to 12 hours ago. The problem occurs constantly. The problem has not changed since onset.Pertinent negatives include no chest pain, no abdominal pain, no headaches and no shortness of breath. Nothing aggravates the symptoms. Nothing relieves the symptoms. He has tried nothing for the symptoms. The treatment provided no relief.      Home Medications Prior to Admission medications   Medication Sig Start Date End Date Taking? Authorizing Provider  albuterol (VENTOLIN HFA) 108 (90 Base) MCG/ACT inhaler Inhale 1 puff into the lungs every 6 (six) hours as needed for shortness of breath. 10/18/20   [provider]  aspirin EC 81 MG tablet Take 81 mg by mouth daily. Swallow whole.    [provider]  levETIRAcetam (KEPPRA) 500 MG tablet Take 1 tablet (500 mg total) by mouth 2 (two) times daily. 05/06/21   Merrilyn Puma, MD  ondansetron (ZOFRAN) 8 MG tablet Take 8 mg by mouth every 8 (eight) hours as needed for nausea/vomiting. 03/17/21   [provider]  promethazine (PHENERGAN) 25 MG tablet Take 25 mg by mouth every 8 (eight) hours as needed for nausea/vomiting. 03/17/21   [provider]  Tiotropium Bromide-Olodaterol 2.5-2.5 MCG/ACT AERS Inhale 2 puffs into the lungs daily as needed for shortness of breath. 08/25/20   [provider]      Allergies    Patient has no known allergies.    Review of Systems   Review of Systems  Constitutional:  Positive for fever. Negative for chills and fatigue.  HENT:  Negative for  congestion.   Eyes:  Negative for visual disturbance.  Respiratory:  Negative for chest tightness and shortness of breath.   Cardiovascular:  Negative for chest pain.  Gastrointestinal:  Negative for abdominal pain, constipation, diarrhea, nausea and vomiting.  Genitourinary:  Negative for flank pain.  Musculoskeletal:  Negative for back pain and neck pain.  Skin:  Negative for rash and wound.  Neurological:  Positive for weakness. Negative for speech difficulty, light-headedness, numbness and headaches.  Psychiatric/Behavioral:  Negative for agitation.   All other systems reviewed and are negative.  Physical Exam Updated Vital Signs BP 119/84 (BP Location: Right Arm)   Pulse (!) 111   Temp (!) 100.5 F (38.1 C) (Oral)   Resp 18   Ht 6\' 1"  (1.854 m)   Wt 84.4 kg   SpO2 95%   BMI 24.54 kg/m  Physical Exam Vitals and nursing note reviewed.  Constitutional:      General: He is not in acute distress.    Appearance: He is well-developed. He is not ill-appearing, toxic-appearing or diaphoretic.  HENT:     Head: Normocephalic and atraumatic.     Nose: No congestion or rhinorrhea.     Mouth/Throat:     Mouth: Mucous membranes are moist.  Eyes:     Extraocular Movements: Extraocular movements intact.     Conjunctiva/sclera: Conjunctivae normal.     Pupils: Pupils are equal, round, and reactive to light.  Cardiovascular:     Rate and Rhythm:  Normal rate and regular rhythm.     Heart sounds: No murmur heard. Pulmonary:     Effort: Pulmonary effort is normal. No respiratory distress.     Breath sounds: Normal breath sounds. No wheezing, rhonchi or rales.  Chest:     Chest wall: No tenderness.  Abdominal:     Palpations: Abdomen is soft.     Tenderness: There is no abdominal tenderness. There is no guarding or rebound.  Musculoskeletal:        General: No swelling or tenderness.     Cervical back: Neck supple. No tenderness.  Skin:    General: Skin is warm and dry.      Capillary Refill: Capillary refill takes less than 2 seconds.     Findings: No erythema.  Neurological:     Mental Status: He is alert.     Cranial Nerves: Facial asymmetry present.     Sensory: No sensory deficit.     Motor: Weakness and abnormal muscle tone present.     Comments: Left facial droop, left arm, left leg weakness.  No numbness.  Clear speech.  Pupils symmetric and reactive normal extract movements.  Psychiatric:        Mood and Affect: Mood normal.    ED Results / Procedures / Treatments   Labs (all labs ordered are listed, but only abnormal results are displayed) Labs Reviewed  CBC - Abnormal; Notable for the following components:      Result Value   RDW 16.5 (*)    All other components within normal limits  COMPREHENSIVE METABOLIC PANEL - Abnormal; Notable for the following components:   Sodium 132 (*)    Chloride 97 (*)    AST 53 (*)    All other components within normal limits  I-STAT CHEM 8, ED - Abnormal; Notable for the following components:   Sodium 132 (*)    Chloride 97 (*)    Calcium, Ion 1.04 (*)    All other components within normal limits  RESP PANEL BY RT-PCR (FLU A&B, COVID) ARPGX2  URINE CULTURE  CULTURE, BLOOD (ROUTINE X 2)  CULTURE, BLOOD (ROUTINE X 2)  ETHANOL  PROTIME-INR  APTT  DIFFERENTIAL  RAPID URINE DRUG SCREEN, HOSP PERFORMED  URINALYSIS, ROUTINE W REFLEX MICROSCOPIC  LACTIC ACID, PLASMA  LACTIC ACID, PLASMA  LEVETIRACETAM LEVEL  CBG MONITORING, ED  CBG MONITORING, ED    EKG None  Radiology CT HEAD WO CONTRAST  Result Date: 05/19/2021 CLINICAL DATA:  Neuro deficit, acute, stroke suspected. Facial droop. Left-sided weakness. History of brain tumor. EXAM: CT HEAD WITHOUT CONTRAST TECHNIQUE: Contiguous axial images were obtained from the base of the skull through the vertex without intravenous contrast. RADIATION DOSE REDUCTION: This exam was performed according to the departmental dose-optimization program which includes  automated exposure control, adjustment of the mA and/or kV according to patient size and/or use of iterative reconstruction technique. COMPARISON:  Head CT and MRI 05/05/2021 FINDINGS: Brain: Sequelae of right pterional craniotomy are again identified with unchanged underlying dural thickening and small volume extra-axial fluid there is a large region of vasogenic edema centered in the posterior right frontal lobe which has progressed from the prior studies, extends into the deep white matter tracts at the level of the basal ganglia, and results in partial effacement of the right lateral ventricle with trace leftward midline shift. A subcentimeter hyperdense focus within this region of edema is unchanged and may reflect calcification or hemorrhage. A small region of edema in the left  frontal lobe has mildly increased as well. No new intracranial hemorrhage or acute cortically based infarct is identified. Vascular: Calcified atherosclerosis at the skull base. No hyperdense vessel. Skull: No suspicious osseous lesion. Sinuses/Orbits: Mucosal thickening in the right greater than left maxillary sinuses. Unchanged left mastoid effusion. Unremarkable orbits. Other: None. IMPRESSION: Worsening edema in the right greater than left cerebral hemispheres with new trace leftward midline shift. Electronically Signed   By: Sebastian Ache M.D.   On: 05/19/2021 16:17   DG Chest Port 1 View  Result Date: 05/19/2021 CLINICAL DATA:  Pt arrived POV from home c/o left sided flaccidness and a left facial droop. Pt's LKW was 9am. Pt does have brain cancer and is getting treatments at the [facility] EXAM: PORTABLE CHEST 1 VIEW COMPARISON:  None. FINDINGS: Previous median sternotomy. Cardiac silhouette is normal in size and configuration. No mediastinal or hilar masses. Clear lungs.  No pleural effusion or pneumothorax. Skeletal structures are grossly intact. IMPRESSION: No active disease. Electronically Signed   By: Amie Portland  M.D.   On: 05/19/2021 16:01    Procedures Procedures    Medications Ordered in ED Medications - No data to display  ED Course/ Medical Decision Making/ A&P                           Medical Decision Making   Jondavid Sinanan is a 66 y.o. male with a past medical history significant for previous brain cancer status post surgery still getting treatments at the [facility], hypertension, hyperlipidemia, and CAD who presents with left-sided weakness and facial droop.  According to family, patient was last seen well at 9 AM.  Patient seen in triage and neurology was called who did not feel he should be activated as a code stroke however get the work-up started to rule out hemorrhage given his known cancer, seizures, or other causes of the new left-sided deficits.  Family says that patient also has been hiccuping nonstop today but otherwise has not had chest pain, abdominal pain, nausea, vomiting or report of any vision changes or speech abnormalities.  He is just having the weakness in the left face, left arm, and left leg.  Patient also found to have fever but otherwise is denying headache, neck pain, cough, nausea, vomiting, constipation, diarrhea or urinary symptoms.  Also of note, while in triage, patient had a brief intermittent apneic episode.  Patient was briefly cyanotic until they repositioned him and woke him up.  He needed a sternal rub and loud yelling but is now answering questions and talking.  Of note, previous chart shows that patient was going to have brain surgery [place] but after they started aborted and instead went with chemotherapy and radiation.  Currently being managed by [facility].  On my exam, patient does indeed have weakness in the left face, left arm, and left leg.  He has intact sensation.  Pupils are symmetric and reactive and had intact extraocular movements.  Lungs were clear and chest was nontender.  Abdomen was nontender.  Clinically I am also  concerned about worsened edema or stroke or bleed.  Patient will have a CT head and screening labs.  In the setting of fever, patient could have a viral infection or other occult infection.   Work-up began to return.  CBC shows no leukocytosis or anemia.  Metabolic panel shows normal kidney function and noncritically elevated liver function.  Alcohol undetectable.  Chest x-ray shows no  pneumonia.  Patient initially waiting on CT head.   4:33 PM CT head just returned showing worsened edema and some midline shift.  Will call neurosurgery and discuss.  I suspect they will want steroids, possible MRI, and admission.  Given the evidence of edema and herniation, do not feel he needs lumbar puncture at this time as he is not any headache or neck pain either.  Suspect other viral or occult bacterial infection leading to symptoms.  Anticipate follow-up on neurosurgery recommendations and disposition.  Care transferred to oncoming team to await for work-up to be completed and disposition to be determined.  Due to his persistent neurologic deficits and the episodes of apnea/altered mental status earlier, I do anticipate he will need admission.  Will appreciate the guidance of neurosurgery in regards to steroids and further management.  Family reports they would prefer him to be admitted here for further management.   Final Clinical Impression(s) / ED Diagnoses Final diagnoses:  Acute left-sided weakness  Fever, unspecified fever cause    Clinical Impression: 1. Acute left-sided weakness   2. Fever, unspecified fever cause     Disposition: Admit  This note was prepared with assistance of Dragon voice recognition software. Occasional wrong-word or sound-a-like substitutions may have occurred due to the inherent limitations of voice recognition software.     Hajime Asfaw, Canary Brim, MD 05/19/21 1648    Yanette Tripoli, Canary Brim, MD 05/19/21 [phone]

## 2021-05-19 NOTE — Consult Note (Signed)
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 Hulan Fess is an 66 y.o. male.  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  He did undergo an open craniotomy for biopsy but was not felt that this lesion was resectable.  He presented earlier this month having had a seizure and an MRI was performed demonstrating suggestion of 2 lesions in the right frontal lobe in the area of prior surgery.  There is also some small nodular densities on the left frontal region.  Earlier today he developed significant left-sided weakness in his arm his leg and his face.  He was brought to the emergency room where CT scan demonstrates a substantial amount of vasogenic edema in the left hemisphere.  An MRI recently completed demonstrates that the larger deeper lesion in the right centrum semiovale shows a clear area of T2 uptake after contrast that is rounder and larger than it was just a few weeks ago.  This area now is consistent either with a tumor or possibly a brain abscess.  The patient does have significant sinus disease on the MRI.  Past Medical History:  Diagnosis Date   CAD (coronary artery disease)    History of brain cancer    HLD (hyperlipidemia)    Hypertension    PAD (peripheral artery disease) (HCC)     History reviewed. No pertinent surgical history.  Family History  Problem Relation Age of Onset   Heart disease Mother    Heart disease Father     Social History:  reports that he has been smoking cigarettes. He has been smoking an average of 1 pack per day. He has never used smokeless tobacco. He reports current alcohol use. He reports that he does not use drugs.  Allergies: No Known Allergies  Medications: I have not reviewed the patient's  medications  Results for orders placed or performed during the hospital encounter of 05/19/21 (from the past 48 hour(s))  Urine rapid drug screen (hosp performed)     Status: None   Collection Time: 05/19/21  2:45 PM  Result Value Ref Range   Opiates NONE DETECTED NONE DETECTED   Cocaine NONE DETECTED NONE DETECTED   Benzodiazepines NONE DETECTED NONE DETECTED   Amphetamines NONE DETECTED NONE DETECTED   Tetrahydrocannabinol NONE DETECTED NONE DETECTED   Barbiturates NONE DETECTED NONE DETECTED    Comment: (NOTE) DRUG SCREEN FOR MEDICAL PURPOSES ONLY.  IF CONFIRMATION IS NEEDED FOR ANY PURPOSE, NOTIFY LAB WITHIN 5 DAYS.  LOWEST DETECTABLE LIMITS FOR URINE DRUG SCREEN Drug Class                     Cutoff (ng/mL) Amphetamine and metabolites    1000 Barbiturate and metabolites    200 Benzodiazepine                 200 Tricyclics and metabolites     300 Opiates and metabolites        300 Cocaine and metabolites        300 THC                            50 Performed at [facility], [address], [place], [place] [address]   Urinalysis, Routine w reflex microscopic  Status: Abnormal   Collection Time: 05/19/21  2:45 PM  Result Value Ref Range   Color, Urine YELLOW YELLOW   APPearance CLEAR CLEAR   Specific Gravity, Urine 1.018 1.005 - 1.030   pH 5.0 5.0 - 8.0   Glucose, UA NEGATIVE NEGATIVE mg/dL   Hgb urine dipstick SMALL (A) NEGATIVE   Bilirubin Urine NEGATIVE NEGATIVE   Ketones, ur 5 (A) NEGATIVE mg/dL   Protein, ur NEGATIVE NEGATIVE mg/dL   Nitrite NEGATIVE NEGATIVE   Leukocytes,Ua NEGATIVE NEGATIVE   RBC / HPF 0-5 0 - 5 RBC/hpf   WBC, UA 0-5 0 - 5 WBC/hpf   Bacteria, UA RARE (A) NONE SEEN   Mucus PRESENT     Comment: Performed at [facility], [address], [place], [place] [address]  CBG monitoring, ED     Status: None   Collection Time: 05/19/21  2:52 PM  Result Value Ref Range   Glucose-Capillary 88 70 - 99 mg/dL    Comment: Glucose  reference range applies only to samples taken after fasting for at least 8 hours.  Ethanol     Status: None   Collection Time: 05/19/21  3:05 PM  Result Value Ref Range   Alcohol, Ethyl (B) <10 <10 mg/dL    Comment: (NOTE) Lowest detectable limit for serum alcohol is 10 mg/dL.  For medical purposes only. Performed at [facility], [address] [place], [place] [address]   Protime-INR     Status: None   Collection Time: 05/19/21  3:05 PM  Result Value Ref Range   Prothrombin Time 13.5 11.4 - 15.2 seconds   INR 1.0 0.8 - 1.2    Comment: (NOTE) INR goal varies based on device and disease states. Performed at [facility], [address], [place], [place] [address]   APTT     Status: None   Collection Time: 05/19/21  3:05 PM  Result Value Ref Range   aPTT 33 24 - 36 seconds    Comment: Performed at [facility], [address] [place], [place] [address]  CBC     Status: Abnormal   Collection Time: 05/19/21  3:05 PM  Result Value Ref Range   WBC 4.4 4.0 - 10.5 K/uL   RBC 4.46 4.22 - 5.81 MIL/uL   Hemoglobin 13.4 13.0 - 17.0 g/dL   HCT 29.5 62.1 - 30.8 %   MCV 93.7 80.0 - 100.0 fL   MCH 30.0 26.0 - 34.0 pg   MCHC 32.1 30.0 - 36.0 g/dL   RDW 65.7 (H) 84.6 - 96.2 %   Platelets 177 150 - 400 K/uL   nRBC 0.0 0.0 - 0.2 %    Comment: Performed at [facility], [address], [place], [place] [address]  Differential     Status: None   Collection Time: 05/19/21  3:05 PM  Result Value Ref Range   Neutrophils Relative % 55 %   Neutro Abs 2.5 1.7 - 7.7 K/uL   Lymphocytes Relative 23 %   Lymphs Abs 1.0 0.7 - 4.0 K/uL   Monocytes Relative 20 %   Monocytes Absolute 0.9 0.1 - 1.0 K/uL   Eosinophils Relative 0 %   Eosinophils Absolute 0.0 0.0 - 0.5 K/uL   Basophils Relative 1 %   Basophils Absolute 0.0 0.0 - 0.1 K/uL   Immature Granulocytes 1 %   Abs Immature Granulocytes 0.02 0.00 - 0.07 K/uL    Comment: Performed at [facility], [address] [place], [place] [address]  Comprehensive metabolic panel     Status: Abnormal   Collection Time: 05/19/21  3:05 PM  Result Value Ref Range   Sodium 132 (L) 135 - 145 mmol/L   Potassium 4.2 3.5 - 5.1 mmol/L   Chloride 97 (L) 98 - 111 mmol/L   CO2 22 22 - 32 mmol/L   Glucose, Bld 85 70 - 99 mg/dL    Comment: Glucose reference range applies only to samples taken after fasting for at least 8 hours.   BUN 12 8 - 23 mg/dL   Creatinine, Ser 1.91 0.61 - 1.24 mg/dL   Calcium 9.0 8.9 - 47.8 mg/dL   Total Protein 7.0 6.5 - 8.1 g/dL   Albumin 3.7 3.5 - 5.0 g/dL   AST 53 (H) 15 - 41 U/L   ALT 35 0 - 44 U/L   Alkaline Phosphatase 85 38 - 126 U/L   Total Bilirubin 0.7 0.3 - 1.2 mg/dL   GFR, Estimated >29 >56 mL/min    Comment: (NOTE) Calculated using the CKD-EPI Creatinine Equation (2021)    Anion gap 13 5 - 15    Comment: Performed at [facility], [address], [place], [place] [address]  I-stat chem 8, ED     Status: Abnormal   Collection Time: 05/19/21  3:23 PM  Result Value Ref Range   Sodium 132 (L) 135 - 145 mmol/L   Potassium 4.1 3.5 - 5.1 mmol/L   Chloride 97 (L) 98 - 111 mmol/L   BUN 13 8 - 23 mg/dL   Creatinine, Ser 6.57 0.61 - 1.24 mg/dL   Glucose, Bld 84 70 - 99 mg/dL    Comment: Glucose reference range applies only to samples taken after fasting for at least 8 hours.   Calcium, Ion 1.04 (L) 1.15 - 1.40 mmol/L   TCO2 25 22 - 32 mmol/L   Hemoglobin 15.0 13.0 - 17.0 g/dL   HCT 84.6 96.2 - 95.2 %  POC CBG, ED     Status: None   Collection Time: 05/19/21  4:34 PM  Result Value Ref Range   Glucose-Capillary 83 70 - 99 mg/dL    Comment: Glucose reference range applies only to samples taken after fasting for at least 8 hours.  Lactic acid, plasma     Status: None   Collection Time: 05/19/21  4:56 PM  Result Value Ref Range   Lactic Acid, Venous 1.8 0.5 - 1.9 mmol/L    Comment: Performed at [facility], [address], [place], [place] [address]    CT HEAD WO  CONTRAST  Result Date: 05/19/2021 CLINICAL DATA:  Neuro deficit, acute, stroke suspected. Facial droop. Left-sided weakness. History of brain tumor. EXAM: CT HEAD WITHOUT CONTRAST TECHNIQUE: Contiguous axial images were obtained from the base of the skull through the vertex without intravenous contrast. RADIATION DOSE REDUCTION: This exam was performed according to the departmental dose-optimization program which includes automated exposure control, adjustment of the mA and/or kV according to patient size and/or use of iterative reconstruction technique. COMPARISON:  Head CT and MRI 05/05/2021 FINDINGS: Brain: Sequelae of right pterional craniotomy are again identified with unchanged underlying dural thickening and small volume extra-axial fluid there is a large region of vasogenic edema centered in the posterior right frontal lobe which has progressed from the prior studies, extends into the deep white matter tracts at the level of the basal ganglia, and results in partial effacement  of the right lateral ventricle with trace leftward midline shift. A subcentimeter hyperdense focus within this region of edema is unchanged and may reflect calcification or hemorrhage. A small region of edema in the left frontal lobe has mildly increased as well. No new intracranial hemorrhage or acute cortically based infarct is identified. Vascular: Calcified atherosclerosis at the skull base. No hyperdense vessel. Skull: No suspicious osseous lesion. Sinuses/Orbits: Mucosal thickening in the right greater than left maxillary sinuses. Unchanged left mastoid effusion. Unremarkable orbits. Other: None. IMPRESSION: Worsening edema in the right greater than left cerebral hemispheres with new trace leftward midline shift. Electronically Signed   By: Sebastian Ache M.D.   On: 05/19/2021 16:17   MR Brain W and Wo Contrast  Result Date: 05/19/2021 CLINICAL DATA:  Edema.  History of brain tumor. EXAM: MRI HEAD WITHOUT AND WITH CONTRAST  TECHNIQUE: Multiplanar, multiecho pulse sequences of the brain and surrounding structures were obtained without and with intravenous contrast. CONTRAST:  8.55mL GADAVIST GADOBUTROL 1 MMOL/ML IV SOLN COMPARISON:  MRI of the brain May 05, 2021; head CT May 19, 2021. FINDINGS: Brain: Again seen is 2 nodular lesions in the right hemisphere with interval increase in size of the lesion with restricted diffusion within the right corona radiata, now measuring approximately 2.4 x 2.1 cm, compared to 1.9 x 1.4 cm on prior. The lesion now has a well-defined T2 hypointense halo and persistent peripheral contrast enhancement. There is also significant interval increase of the surrounding vasogenic edema with mass effect on the adjacent cerebral sulci and right lateral ventricle without evidence of significant midline shift or hydrocephalus. The second adjacent lesion with thin peripheral contrast enhancement appear relatively unchanged. The third solid enhancing lesion in the left middle frontal gyrus appear similar in size to prior MRI at 9 mm with stable surrounding vasogenic edema and faint internal restricted diffusion. Vascular: Normal flow voids. Skull and upper cervical spine: Postsurgical changes from right side craniotomy. No marrow lesion identified. Sinuses/Orbits: Mucosal thickening scattered throughout the paranasal sinuses with fluid level within the right maxillary sinus. Correlate clinically for acute sinusitis. The orbits are maintained. Other: Bilateral mastoid effusion. IMPRESSION: 1. Interval increase in size of one of the 2 lesions previously described in the right corona radiata, corresponding to the lesion with internal restricted diffusion. The lesion now shows well-defined T2 hypointense halo and increased surrounding vasogenic edema. Findings are concerning for abscess. 2. Left frontal lesion could neoplastic deposit versus focal cerebritis. 3. Inflammatory paranasal sinus disease. Correlate  clinically for acute sinusitis. Electronically Signed   By: Baldemar Lenis M.D.   On: 05/19/2021 19:21   DG Chest Port 1 View  Result Date: 05/19/2021 CLINICAL DATA:  Pt arrived POV from home c/o left sided flaccidness and a left facial droop. Pt's LKW was 9am. Pt does have brain cancer and is getting treatments at the [facility] EXAM: PORTABLE CHEST 1 VIEW COMPARISON:  None. FINDINGS: Previous median sternotomy. Cardiac silhouette is normal in size and configuration. No mediastinal or hilar masses. Clear lungs.  No pleural effusion or pneumothorax. Skeletal structures are grossly intact. IMPRESSION: No active disease. Electronically Signed   By: Amie Portland M.D.   On: 05/19/2021 16:01    Review of Systems  Constitutional:  Positive for activity change.  HENT: Negative.    Eyes: Negative.   Respiratory: Negative.    Cardiovascular: Negative.   Gastrointestinal: Negative.   Genitourinary: Negative.   Musculoskeletal: Negative.   Neurological:  Positive for weakness.  Hematological: Negative.   Psychiatric/Behavioral: Negative.    Blood pressure 136/74, pulse 95, temperature (!) 100.5 F (38.1 C), resp. rate 20, height 6\' 1"  (1.854 m), weight 84.4 kg, SpO2 95 %. Physical Exam Constitutional:      Appearance: Normal appearance. He is normal weight.  HENT:     Head: Normocephalic.     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     Mouth/Throat:     Mouth: Mucous membranes are moist.  Eyes:     Extraocular Movements: Extraocular movements intact.     Conjunctiva/sclera: Conjunctivae normal.     Pupils: Pupils are equal, round, and reactive to light.  Cardiovascular:     Rate and Rhythm: Normal rate and regular rhythm.     Pulses: Normal pulses.     Heart sounds: Normal heart sounds.  Pulmonary:     Effort: Pulmonary effort is normal.     Breath sounds: Normal breath sounds.  Abdominal:     General: Abdomen  is flat.     Palpations: Abdomen is soft.  Musculoskeletal:        General: Normal range of motion.     Cervical back: Normal range of motion and neck supple.  Skin:    General: Skin is warm and dry.     Capillary Refill: Capillary refill takes less than 2 seconds.  Neurological:     Mental Status: He is alert.     Comments: Alert and oriented left facial weakness noted even with grimace.  Extraocular movements are full pupils are 3 mm and briskly reactive tongue and uvula protrude in the midline moderate weakness in the left upper and left lower extremity being graded at 3 out of 5 with a notable drift on that left upper extremity.  Reflexes absent in the bicep tricep patella and Achilles on the left Babinski reflex is positive on the left and the right  Psychiatric:        Mood and Affect: Mood normal.        Behavior: Behavior normal.        Thought Content: Thought content normal.        Judgment: Judgment normal.    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  He has had a new onset of a seizure approximately 2 weeks ago with a new lesion in early diagnosis on the MRI.  With the onset of left hemiparesis a repeat MRI demonstrates that the deeper lesion is now increased in size at least 4 times and measures 2-1/2 cm in diameter.  There is concern that this may represent an abscess.  I have discussed this with the patient and his daughter who accompanies him.  I have explained that he may require a surgical biopsy of this lesion in order to treat it appropriately.  He had previously resolved not to have any surgical interventions, however this may be a substantially treatable process and I noted to the patient he does not need to make a decision now but needs to carefully consider this.  I will follow-up with him tomorrow for further discussions.  Shary Key Rondy Krupinski 05/19/2021, 8:22 PM

## 2021-05-19 NOTE — H&P (Signed)
? ? ? ?Date: 05/19/2021     ?     ?     ?Patient Name:  Roger Solis MRN: [id number]  ?DOB: June 19, 1955 Age / Sex: 66 y.o., male   ?PCP: [facility]    ?     ?Medical Service: Internal Medicine Teaching Service    ?     ?Attending Physician: Dr. Lottie Mussel, MD    ?First Contact: Dr. Rosezetta Schlatter Pager: [phone]  ?Second Contact: Dr. Virl Axe Pager: [phone]  ?     ?After Hours (After 5p/  First Contact Pager: [phone]  ?weekends / holidays): Second Contact Pager: [phone]  ? ?Chief Complaint: left sided weakness and left facial droop ? ?History of Present Illness: Roger Solis is a 66 yo male with hypertension, HLD, CAD s/p CABG, PAD s/p PCI with stents, primary lung cancer with brain metastasis (immune therapy treatment through [place]) who presents to [facility] with left sided weakness and a facial droop. Of note, he was recently admitted to the IMTS for seizures on 3/3 and was discharged on Keppra twice daily. ? ?History is obtained from the patient and his daughter, at the bedside. This morning, the patient was found to have a left sided facial droop and left arm and left leg weakness. He and his daughter that he was at his baseline until around 9 AM this morning when he started stumbling and became weak on his left side. At his baseline, he requires some assistance with ambulation, but this was a stark difference from his baseline. They also noted a facial droop on his left side, with subsequent drooling. The patient was also having some slurring of his speech and urinary incontinence- both of which are new for the patient. He denies any fevers, chills, seizure like activity, headache, neck pain, dizziness, lightheadedness, vision changes, chest pain, abd pain, n/v/d, constipation, dysuria, or hematuria. The patient's daughter notes that he has had the hiccup for the past 12 hours and this is unusual for him. The patient also stated that he has been eating and drinking normally until today,  but he has not been hungry today. He denies any difficulties with swallowing. While he was in triage in the ED, he had a brief apniec episode where he required a sternal rub to awaken, but on my exam, the patient is awake/alert, and answering questions appropriately.  ? ? ?Meds:  ?Albuterol ?Aspirin 81 mg daily ?Keppra 500 mg bid ?Zofran prn ?Phenergan prn ?Tiotropium Bromide 2 puffs daily prn ?Statin  ?No outpatient medications have been marked as taking for the 05/19/21 encounter [facility] Encounter).  ? ? ? ?Allergies: ?Allergies as of 05/19/2021  ? (No Known Allergies)  ? ?Past Medical History:  ?Diagnosis Date  ? CAD (coronary artery disease)   ? History of brain cancer   ? HLD (hyperlipidemia)   ? Hypertension   ? PAD (peripheral artery disease) ([place])   ? ? ?Family History: Cardiovascular disease (maternal and paternal side) ? ?Social History: Lives in [place] with his daughter and her husband and daughter.  ?Needs some asisstance with ambulation (daughter assists) ?PC[facility] ?Smokes about 0.5-1 packs per day for 40 years ?No alcohol or illicit drug use.  ? ?Review of Systems: ?A complete ROS was negative except as per HPI.  ? ?Physical Exam: ?Blood pressure (!) 152/73, pulse 94, temperature (!) 100.5 ?F (38.1 ?C), temperature source Oral, resp. rate 15, height 6\' 1"  (1.854 m), weight 84.4 kg, SpO2 97 %. ? ?General:  Pleasant, chronically ill-appearing male laying in bed. No acute distress. ?HEENT: PERRL. EOMI.  ?CV: RRR. No murmurs, rubs, or gallops. No LE edema ?Pulmonary: Lungs CTAB. Normal effort. No wheezing or rales. ?Abdominal: Soft, nontender, nondistended. Normal bowel sounds. ?Extremities: Palpable radial and DP pulses. Normal bulk and tone.  ?Skin: Warm and dry.  ?Neuro: A&Ox3. PERRL. EOMI. CN II-XII intact, except for CN VII on left (left facial droop, unable to smile) and CN XI on the left (unable to shrug left shoulder).  ?Sensation intact.  ?Right arm and right leg 5/5 strength ?Left  arm 3/5 strength (unable to lift against resistance, can only lift forearm against gravity/cannot lift elbow) ?Left leg 3/5 strength (unable to lift against resistance)  ?Speech is slightly slurred, although able to understand.  ?Psych: Normal mood and affect ? ? ?EKG: personally reviewed my interpretation is sinus tachycardia  ? ?CXR: personally reviewed my interpretation is no acute cardiopulmonary disease ? ?CMP  ?   ?Component Value Date/Time  ? NA 132 (L) 05/19/2021 1523  ? K 4.1 05/19/2021 1523  ? CL 97 (L) 05/19/2021 1523  ? CO2 22 05/19/2021 1505  ? GLUCOSE 84 05/19/2021 1523  ? BUN 13 05/19/2021 1523  ? CREATININE 0.90 05/19/2021 1523  ? CALCIUM 9.0 05/19/2021 1505  ? PROT 7.0 05/19/2021 1505  ? ALBUMIN 3.7 05/19/2021 1505  ? AST 53 (H) 05/19/2021 1505  ? ALT 35 05/19/2021 1505  ? ALKPHOS 85 05/19/2021 1505  ? BILITOT 0.7 05/19/2021 1505  ? GFRNONAA >60 05/19/2021 1505  ? ?CBC ?   ?Component Value Date/Time  ? WBC 4.4 05/19/2021 1505  ? RBC 4.46 05/19/2021 1505  ? HGB 15.0 05/19/2021 1523  ? HCT 44.0 05/19/2021 1523  ? PLT 177 05/19/2021 1505  ? MCV 93.7 05/19/2021 1505  ? MCH 30.0 05/19/2021 1505  ? MCHC 32.1 05/19/2021 1505  ? RDW 16.5 (H) 05/19/2021 1505  ? LYMPHSABS 1.0 05/19/2021 1505  ? MONOABS 0.9 05/19/2021 1505  ? EOSABS 0.0 05/19/2021 1505  ? BASOSABS 0.0 05/19/2021 1505  ? ? ?Assessment & Plan by Problem: ?Principal Problem: ?  Stroke [facility]) ? ? ?#Cerebral infarction rule out ?#Lung cancer with brain metastasis  ?#Cerebral edema ?Patient presented with left sided facial droop, left arm, and left leg weakness that began today- last known normal was around 9 AM. Patient has a history of lung cancer with mets to his brain and is receiving immune therapy through the [place] (last received treatment yesterday) and also has plans to begin radiation therapy to his brain. CT head in the ED showed worsening edema in the R>L cerebral hemispheres with new trace leftward midline shift- no acute intracranial  hemorrhage was identified. Patient also had a low grade fever of 100.5 in the ED, although he does not have any other signs of an active infection at this time. Patient does not clinically appear septic and has no signs of infection on UA or CXR, has no s/s of meningitis, and also has no leukocytosis and lactic acid level is within normal limits. Although the cerebral edema could be the explanation for this patient's symptoms, MRI brain is still pending. Neurosurgery was consulted from the ED and recommended IV Decadron- they will evaluate the patient tonight or in the morning.  ?- Neurosurgery and neurology consulted, appreciate recs ?- MRI brain pending ?- Loading dose of Decadron 10 mg now, followed by 6 mg q6h  ?- NPO pending bedside swallow eval/stroke swallow screen ?- Daily CBC, BMP ?-  Blood and urine cultures pending  ? 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 Patient has been adherent with this medication and has not had any seizure like activity.  ?- Continue Keppra 500 mg bid  ?- Seizure precautions ?- Keppra level in process  ? ?#CAD s/p CABG ?#PAD s/p PCI with DES ?Patient believes he is on a statin medication at home, although he is unsure which one.  He denies any chest pain, SOB, or s/s claudication at this time. His extremities are warm and he has good peripheral pulses. Will hold off on resuming statin/aspirin, as we are unsure if these were recently discontinued in the setting of his chemotherapy/immune therapy treatment (these were documented as discontinued during his last hospitalization).  ? ? ?Best practices: ?Code: DNR ?Diet: NPO pending bedside swallow ?VTE:  Lovenox  ?Family contact: Daughter, Ishmael Holter, at the bedside ? ?Dispo: Admit patient to Observation with expected length of stay less than 2 midnights. ? ?Signed: ?Dorethea Clan, DO ?05/19/2021,  6:47 PM  ?Pager: [phone] ?After 5pm on weekdays and 1pm on weekends: On Call pager: [phone] ? ?

## 2021-05-20 DIAGNOSIS — C3490 Malignant neoplasm of unspecified part of unspecified bronchus or lung: Secondary | ICD-10-CM | POA: Diagnosis present

## 2021-05-20 DIAGNOSIS — K5900 Constipation, unspecified: Secondary | ICD-10-CM | POA: Diagnosis present

## 2021-05-20 DIAGNOSIS — Z951 Presence of aortocoronary bypass graft: Secondary | ICD-10-CM | POA: Diagnosis not present

## 2021-05-20 DIAGNOSIS — I10 Essential (primary) hypertension: Secondary | ICD-10-CM | POA: Diagnosis not present

## 2021-05-20 DIAGNOSIS — R2990 Unspecified symptoms and signs involving the nervous system: Secondary | ICD-10-CM | POA: Diagnosis present

## 2021-05-20 DIAGNOSIS — Z955 Presence of coronary angioplasty implant and graft: Secondary | ICD-10-CM | POA: Diagnosis not present

## 2021-05-20 DIAGNOSIS — G936 Cerebral edema: Secondary | ICD-10-CM | POA: Diagnosis present

## 2021-05-20 DIAGNOSIS — C7931 Secondary malignant neoplasm of brain: Secondary | ICD-10-CM | POA: Diagnosis present

## 2021-05-20 DIAGNOSIS — E871 Hypo-osmolality and hyponatremia: Secondary | ICD-10-CM | POA: Diagnosis present

## 2021-05-20 DIAGNOSIS — R531 Weakness: Secondary | ICD-10-CM | POA: Diagnosis not present

## 2021-05-20 DIAGNOSIS — I2510 Atherosclerotic heart disease of native coronary artery without angina pectoris: Secondary | ICD-10-CM | POA: Diagnosis present

## 2021-05-20 DIAGNOSIS — Z8249 Family history of ischemic heart disease and other diseases of the circulatory system: Secondary | ICD-10-CM | POA: Diagnosis not present

## 2021-05-20 DIAGNOSIS — F17210 Nicotine dependence, cigarettes, uncomplicated: Secondary | ICD-10-CM | POA: Diagnosis present

## 2021-05-20 DIAGNOSIS — I739 Peripheral vascular disease, unspecified: Secondary | ICD-10-CM | POA: Diagnosis present

## 2021-05-20 DIAGNOSIS — Z66 Do not resuscitate: Secondary | ICD-10-CM | POA: Diagnosis present

## 2021-05-20 DIAGNOSIS — R339 Retention of urine, unspecified: Secondary | ICD-10-CM | POA: Diagnosis present

## 2021-05-20 DIAGNOSIS — U071 COVID-19: Secondary | ICD-10-CM | POA: Diagnosis present

## 2021-05-20 DIAGNOSIS — R29810 Facial weakness: Secondary | ICD-10-CM | POA: Diagnosis present

## 2021-05-20 DIAGNOSIS — Z85841 Personal history of malignant neoplasm of brain: Secondary | ICD-10-CM | POA: Diagnosis not present

## 2021-05-20 DIAGNOSIS — J449 Chronic obstructive pulmonary disease, unspecified: Secondary | ICD-10-CM | POA: Diagnosis present

## 2021-05-20 DIAGNOSIS — R509 Fever, unspecified: Secondary | ICD-10-CM | POA: Diagnosis not present

## 2021-05-20 DIAGNOSIS — G939 Disorder of brain, unspecified: Secondary | ICD-10-CM | POA: Diagnosis not present

## 2021-05-20 DIAGNOSIS — G8194 Hemiplegia, unspecified affecting left nondominant side: Secondary | ICD-10-CM | POA: Diagnosis present

## 2021-05-20 DIAGNOSIS — R066 Hiccough: Secondary | ICD-10-CM | POA: Diagnosis present

## 2021-05-20 LAB — CBC
HCT: 36.3 % — ABNORMAL LOW (ref 39.0–52.0)
Hemoglobin: 12.5 g/dL — ABNORMAL LOW (ref 13.0–17.0)
MCH: 30.9 pg (ref 26.0–34.0)
MCHC: 34.4 g/dL (ref 30.0–36.0)
MCV: 89.6 fL (ref 80.0–100.0)
Platelets: 140 10*3/uL — ABNORMAL LOW (ref 150–400)
RBC: 4.05 MIL/uL — ABNORMAL LOW (ref 4.22–5.81)
RDW: 16.4 % — ABNORMAL HIGH (ref 11.5–15.5)
WBC: 2.2 10*3/uL — ABNORMAL LOW (ref 4.0–10.5)
nRBC: 0 % (ref 0.0–0.2)

## 2021-05-20 LAB — LEVETIRACETAM LEVEL: Levetiracetam Lvl: 4.3 ug/mL — ABNORMAL LOW (ref 10.0–40.0)

## 2021-05-20 LAB — URINE CULTURE: Culture: NO GROWTH

## 2021-05-20 LAB — BASIC METABOLIC PANEL
Anion gap: 8 (ref 5–15)
BUN: 15 mg/dL (ref 8–23)
CO2: 23 mmol/L (ref 22–32)
Calcium: 8.4 mg/dL — ABNORMAL LOW (ref 8.9–10.3)
Chloride: 101 mmol/L (ref 98–111)
Creatinine, Ser: 0.82 mg/dL (ref 0.61–1.24)
GFR, Estimated: >60 mL/min (ref >60)
Glucose, Bld: 160 mg/dL — ABNORMAL HIGH (ref 70–99)
Potassium: 3.7 mmol/L (ref 3.5–5.1)
Sodium: 132 mmol/L — ABNORMAL LOW (ref 135–145)

## 2021-05-20 NOTE — Plan of Care (Signed)
  Problem: Education: Goal: Knowledge of General Education information will improve Description Including pain rating scale, medication(s)/side effects and non-pharmacologic comfort measures Outcome: Progressing   

## 2021-05-20 NOTE — Progress Notes (Signed)
? ?Subjective:  ?Patient seen with granddaughter at the bedside.  Granddaughter reports that patient had a fall onto his left side 3/16 while getting out of the shower.  Since, he has had progressively work worsening weakness of his entire left side.  Yesterday with left-sided facial droop and dysarthria, they brought him to the ED. ? ?This a.m., patient reports mild improvements to weakness.  Incidentally found to be COVID-positive on admission.  Daughter and son-in-law, with whom he lives, COVID-positive last week but maintained largely quarantined.  Patient with chronic cough productive of clear sputum and denies changes to his sputum, fever, sweats, chills, chest pain, and dyspnea. ? ?Patient reports decreased appetite but no bowel movement in 1 week, which is typical for him. ? ?Objective: ? ?Vital signs in last 24 hours: ?Vitals:  ? 05/19/21 2043 05/19/21 2345 05/20/21 0330 05/20/21 0727  ?BP: (!) 141/41 107/60 122/73 133/67  ?Pulse: 96 78 71 62  ?Resp: (!) 21 18 16 16   ?Temp: 98.4 ?F (36.9 ?C) 98.2 ?F (36.8 ?C) 97.8 ?F (36.6 ?C) (!) 97.5 ?F (36.4 ?C)  ?TempSrc: Oral Axillary Oral Oral  ?SpO2: 98% 97% 99% 98%  ?Weight: 78.3 kg     ?Height: 6\' 1"  (1.854 m)     ? ?Physical Exam: ?General: Chronically ill-appearing male in NAD with pink beanie on head ?HENT: normocephalic, atraumatic, external nares and ears appear normal ?EYES: conjunctiva non-erythematous, no scleral icterus ?CV: regular rate, normal rhythm, no murmurs, rubs, gallops. ?Pulmonary: normal work of breathing on RA, lungs clear to auscultation bilaterally ?Abdominal: non-distended, soft, non-tender to palpation, normal BS all 4 quadrants ?Skin: Warm and dry ?Neurological: Awake, alert and oriented x3.  EOMI, all visual fields intact.  Facial and bodily sensation intact bilaterally.  Facial symmetry at rest but left-sided facial droop when smiling and left tongue deviation.  Asymmetric shoulder shrug with left shoulder unable to shrug.  Strength 5/5  RUE, 3+/5 RLE and LUE, 3/5 LLE. ?Psych: normal affect and behavior  ? ?Assessment/Plan: ? ?Principal Problem: ?  Stroke-like symptoms ?Active Problems: ?  Lung cancer metastatic to brain [facility]) ? ?#Stroke-like symptoms ?#Lung cancer with brain metastasis  ?#Cerebral edema ?Patient with left sided facial droop, left tongue deviation, and left arm and leg weakness. Patient has a history of lung cancer with mets to his brain and is receiving immune therapy through the [place] (last received treatment 3/16) and also has plans to begin radiation therapy to his brain. CT head in the ED showed worsening edema in the R>L cerebral hemispheres with new trace leftward midline shift- no acute intracranial hemorrhage was identified. Patient also had a low grade fever of 100.5 in the ED, and no longer febrile. Patient does not clinically appear septic and has no signs of infection on UA or CXR, has no s/s of meningitis, and also has no leukocytosis and lactic acid level is within normal limits. MRI brain with increase in size of one of the 2 lesions previously seen, which now shows well-defined T2 hypointense halo and increased surrounding vasogenic edema concerning for abscess. Blood (24 hrs) and urine cx negative. ?- Per Neurosurgery, patient to have a stereotactic biopsy or drainage of the R frontal lobe lesion, tentatively Tuesday. ?- Continue Decadron 6 mg q6h  ?- Diet Heart with thin liquids as patient did not have  ?- Daily CBC, BMP ?  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 Patient has been adherent with this medication and has not had any seizure like activity. Keppra level 4.3. ?- Continue Keppra 500 mg bid  ?- Seizure precautions ?  ?#CAD s/p CABG ?#PAD s/p PCI with DES ?Will hold off on resuming statin/aspirin, as we are unsure if these were recently discontinued in the setting of his  chemotherapy/immune therapy treatment (these were documented as discontinued during his last hospitalization).  ? ?Prior to Admission Living Arrangement: Home ?Anticipated Discharge Location: Pending clinical improvement and PT/OT recs ?Barriers to Discharge: Procedure ?Dispo: Anticipated discharge in approximately 5-7 day(s).  ? ?Rosezetta Schlatter, MD ?05/20/2021, 3:45 PM ?Pager: [phone] ?After 5pm on weekdays and 1pm on weekends: On Call pager [phone]  ?

## 2021-05-20 NOTE — Progress Notes (Signed)
Patient ID: [place] Sol[place], male   DOB: Feb 20, 1956, 66 y.o.   MRN: [id number] ?Vital signs are stable ?Patient is awake and alert in his left upper extremity appears to be doing somewhat better than it was yesterday.  There is still some weakness and drift. ?Apparently the patient is testing positive for COVID. ?I discussed obtaining a stereotactic biopsy or drainage of the lesion in the right frontal lobe.  They are eager to consider this and proceed.  We may tentatively be able to schedule it for Tuesday afternoon. ?Continues Decadron and supportive care. ?

## 2021-05-21 DIAGNOSIS — C3490 Malignant neoplasm of unspecified part of unspecified bronchus or lung: Secondary | ICD-10-CM

## 2021-05-21 DIAGNOSIS — C7931 Secondary malignant neoplasm of brain: Secondary | ICD-10-CM | POA: Diagnosis not present

## 2021-05-21 DIAGNOSIS — R2990 Unspecified symptoms and signs involving the nervous system: Secondary | ICD-10-CM | POA: Diagnosis not present

## 2021-05-21 DIAGNOSIS — R509 Fever, unspecified: Secondary | ICD-10-CM

## 2021-05-21 LAB — CBC WITH DIFFERENTIAL/PLATELET
Abs Immature Granulocytes: 0.01 10*3/uL (ref 0.00–0.07)
Basophils Absolute: 0 10*3/uL (ref 0.0–0.1)
Basophils Relative: 0 %
Eosinophils Absolute: 0 10*3/uL (ref 0.0–0.5)
Eosinophils Relative: 0 %
HCT: 37.6 % — ABNORMAL LOW (ref 39.0–52.0)
Hemoglobin: 12.8 g/dL — ABNORMAL LOW (ref 13.0–17.0)
Immature Granulocytes: 0 %
Lymphocytes Relative: 18 %
Lymphs Abs: 0.7 10*3/uL (ref 0.7–4.0)
MCH: 30.7 pg (ref 26.0–34.0)
MCHC: 34 g/dL (ref 30.0–36.0)
MCV: 90.2 fL (ref 80.0–100.0)
Monocytes Absolute: 0.6 10*3/uL (ref 0.1–1.0)
Monocytes Relative: 14 %
Neutro Abs: 2.7 10*3/uL (ref 1.7–7.7)
Neutrophils Relative %: 68 %
Platelets: 141 10*3/uL — ABNORMAL LOW (ref 150–400)
RBC: 4.17 MIL/uL — ABNORMAL LOW (ref 4.22–5.81)
RDW: 16.3 % — ABNORMAL HIGH (ref 11.5–15.5)
WBC: 4.1 10*3/uL (ref 4.0–10.5)
nRBC: 0 % (ref 0.0–0.2)

## 2021-05-21 LAB — BASIC METABOLIC PANEL
Anion gap: 9 (ref 5–15)
BUN: 18 mg/dL (ref 8–23)
CO2: 24 mmol/L (ref 22–32)
Calcium: 8.4 mg/dL — ABNORMAL LOW (ref 8.9–10.3)
Chloride: 99 mmol/L (ref 98–111)
Creatinine, Ser: 0.8 mg/dL (ref 0.61–1.24)
GFR, Estimated: >60 mL/min (ref >60)
Glucose, Bld: 118 mg/dL — ABNORMAL HIGH (ref 70–99)
Potassium: 3.9 mmol/L (ref 3.5–5.1)
Sodium: 132 mmol/L — ABNORMAL LOW (ref 135–145)

## 2021-05-21 MED ORDER — METOCLOPRAMIDE HCL 5 MG/ML IJ SOLN
10.0000 mg | Freq: Three times a day (TID) | INTRAMUSCULAR | Status: DC
  Administered 2021-05-21 – 2021-05-24 (×8): 10 mg via INTRAVENOUS
  Filled 2021-05-21 (×8): qty 2

## 2021-05-21 NOTE — Progress Notes (Signed)
Patient ID: [place] Solis, male   DOB: 01/18/1956, 66 y.o.   MRN: [id number] ?Left upper extremity strength is significantly improved.  He is in much better spirits today.  Vital signs remained stable.  Continues steroid medication.  Biopsy planned for Tuesday ?

## 2021-05-21 NOTE — Progress Notes (Signed)
? ?Subjective:  ?Patient initially seen alone at the bedside. He reports feeling well today, with improvements to weakness.  Denies dyspnea and home O2 use.  Granddaughter comes into room and states that patient's hiccups have been ongoing since yesterday's visit. ? ?No acute concerns or complaints today. ?Objective: ? ?Vital signs in last 24 hours: ?Vitals:  ? 05/20/21 0727 05/20/21 1954 05/20/21 2319 05/21/21 0330  ?BP: 133/67 139/70 121/63 (!) 141/67  ?Pulse: 62 71 63 (!) 52  ?Resp: 16 20 19 16   ?Temp: (!) 97.5 ?F (36.4 ?C) 98.3 ?F (36.8 ?C) 98.4 ?F (36.9 ?C) 99 ?F (37.2 ?C)  ?TempSrc: Oral Oral Oral Oral  ?SpO2: 98% 97% 97% 99%  ?Weight:      ?Height:      ? ?Physical Exam: ?General: Chronically ill-appearing male in NAD with [place] beanie on head ?HENT: normocephalic, atraumatic, external nares and ears appear normal ?EYES: conjunctiva non-erythematous, no scleral icterus ?Pulmonary: normal work of breathing on nasal cannula, O2 level decreased, and patient with normal work of breathing on RA ?Neurological: Awake, alert and oriented x3.  EOMI, all visual fields intact. Facial symmetry at rest but residual left-sided facial droop when smiling. ?Psych: normal affect and behavior  ? ?Assessment/Plan: ? ?Principal Problem: ?  Stroke-like symptoms ?Active Problems: ?  Stroke [facility]) ?  Lung cancer metastatic to brain [facility]) ? ?#Stroke-like symptoms ?#Lung cancer with brain metastasis  ?#Cerebral edema ?Patient with left sided facial droop, left tongue deviation, and left arm and leg weakness. Patient has a history of lung cancer with mets to his brain and is receiving immune therapy through the [place] (last received treatment 3/16) and also has plans to begin radiation therapy to his brain. CT head in the ED showed worsening edema in the R>L cerebral hemispheres with new trace leftward midline shift- no acute intracranial hemorrhage was identified. Patient also had a low grade fever of 100.5 in the ED, and no longer  febrile. Patient does not clinically appear septic and has no signs of infection on UA or CXR, has no s/s of meningitis, and also has no leukocytosis and lactic acid level is within normal limits. MRI brain with increase in size of one of the 2 lesions previously seen, which now shows well-defined T2 hypointense halo and increased surrounding vasogenic edema concerning for abscess. Blood (48 hrs) and urine cx negative. ?- Per Neurosurgery, patient to have a stereotactic biopsy or drainage of the R frontal lobe lesion Tuesday. ?- Continue Decadron 6 mg q6h  ?- Diet Heart with thin liquids ?- Daily CBC, BMP ?  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 Patient has been adherent with this medication and has not had any seizure like activity. Keppra level 4.3. ?- Continue Keppra 500 mg IV bid.  Discussed with pharmacy, no need to increase Keppra at this time. ?- Seizure precautions ?  ?#CAD s/p CABG ?#PAD s/p PCI with DES ?Will hold off on resuming statin/aspirin, as we are unsure if these were recently discontinued in the setting of his chemotherapy/immune therapy treatment (these were documented as discontinued during his last hospitalization).  ? ?Prior to Admission Living Arrangement: Home ?Anticipated Discharge Location: Pending clinical improvement and PT/OT recs ?Barriers to Discharge: Procedure ?Dispo: Anticipated discharge in approximately 5-7 day(s).  ? Rosezetta Schlatter, MD ?05/21/2021, 6:57 AM ?Pager: [phone] ?After 5pm on weekdays and 1pm on  weekends: On Call pager [phone]  ?

## 2021-05-21 NOTE — Plan of Care (Signed)

## 2021-05-21 NOTE — Plan of Care (Signed)
  Problem: Education: Goal: Knowledge of General Education information will improve Description Including pain rating scale, medication(s)/side effects and non-pharmacologic comfort measures Outcome: Progressing   Problem: Health Behavior/Discharge Planning: Goal: Ability to manage health-related needs will improve Outcome: Progressing   

## 2021-05-22 ENCOUNTER — Other Ambulatory Visit: Payer: Self-pay | Admitting: Radiation Therapy

## 2021-05-22 ENCOUNTER — Ambulatory Visit
Admit: 2021-05-22 | Discharge: 2021-05-22 | Disposition: A | Discharge status: Home / Self Care | Payer: No Typology Code available for payment source | Attending: Radiation Oncology | Admitting: Radiation Oncology

## 2021-05-22 ENCOUNTER — Other Ambulatory Visit: Payer: Self-pay | Admitting: Neurological Surgery

## 2021-05-22 DIAGNOSIS — U071 COVID-19: Secondary | ICD-10-CM

## 2021-05-22 DIAGNOSIS — C3490 Malignant neoplasm of unspecified part of unspecified bronchus or lung: Secondary | ICD-10-CM

## 2021-05-22 DIAGNOSIS — G936 Cerebral edema: Secondary | ICD-10-CM | POA: Diagnosis not present

## 2021-05-22 DIAGNOSIS — R2990 Unspecified symptoms and signs involving the nervous system: Secondary | ICD-10-CM | POA: Diagnosis not present

## 2021-05-22 MED ORDER — IPRATROPIUM-ALBUTEROL 0.5-2.5 (3) MG/3ML IN SOLN
3.0000 mL | Freq: Four times a day (QID) | RESPIRATORY_TRACT | Status: DC | PRN
  Administered 2021-05-22 – 2021-05-23 (×2): 3 mL via RESPIRATORY_TRACT
  Filled 2021-05-22 (×2): qty 3

## 2021-05-22 MED ORDER — ONDANSETRON 4 MG PO TBDP: 4.0000 mg | ORAL_TABLET | Freq: Three times a day (TID) | ORAL | Status: DC | PRN

## 2021-05-22 MED ORDER — ONDANSETRON HCL 4 MG/2ML IJ SOLN
4.0000 mg | Freq: Three times a day (TID) | INTRAMUSCULAR | Status: DC | PRN
  Administered 2021-05-22: 4 mg via INTRAVENOUS
  Filled 2021-05-22: qty 2

## 2021-05-22 NOTE — Progress Notes (Signed)
Patient ID: [place] Solis, male   DOB: 07/05/1955, 66 y.o.   MRN: [id number] ?Patient has mild left facial but left-sided hemiparesis seems all but resolved.  He has not been out of bed since hospitalized.  I believe having him ambulate and move about with PT and OT would be of benefit. ? ?Plan is for stereotactic biopsy tomorrow.  He will have a stereotactic head CT tonight.  I will order consent and n.p.o. after midnight. ?

## 2021-05-22 NOTE — Progress Notes (Signed)
Notified on call MD about pt requesting albuterol breathing tx. States he has home inhalers and neb txs that he does when he feels like his chest is tight and feels like he needs a tx now. No wheezing auscultated on assessment. Awaiting new orders. Continuing to monitor.  ?

## 2021-05-22 NOTE — Consult Note (Signed)
?Radiation Oncology         [phone] ?________________________________ ? ?Name: [place]        MRN: [id number]  ?Date of Service: 05/22/21 DOB: 05/15/1955 ? ?[place], [place]    ? ?REFERRING PHYSICIAN: Dr. Ellene [place] ? ? ?DIAGNOSIS: The primary encounter diagnosis was Acute left-sided weakness. Diagnoses of Fever, unspecified fever cause and Vasogenic brain edema ([place]) were also pertinent to this visit. ? ? ?HISTORY OF PRESENT ILLNESS: [place] is a 66 y.o. male seen at the request of Dr. 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 The history was gathered from his daughter and in speaking with Dr. Bufford Spikes, MD his radiation oncologist in [place]. In summary, the patient was diagnosed with lung cancer in 2022, and it sounds like he had Stage III Squamous cell Carcinoma of the lung, was treated with some form of chemoRT or palliative chemo to the chest, as well as 6 cycles of chemotherapy. He developed metastatic disease and underwent craniotomy to both frontal lesions in the right cerebral hemisphere and had postop cyberknife to both right frontal, a left frontal, and cerebellar metastasis. He developed progressive disease in the occipital lobe, and received 2 cyberknife treamtments with Dr. Domingo [place] in January 2022. Details of the dates and doses of therapy are not available at the time of dictation. He has been receiving palliative immunotherapy with Dr. Chancy [place] at the [place]. I've left a voicemail there with their team for treatment summaries as well.  ? ?He presented on 05/05/21 with facial drooping and seizure activity and MRI brain showed 2 adjacent enhancing lesions in the right frontal lobe with different appearing more superficial and deep portions together measuring up to 2.2 cm with some debris and more superior [place] aspect that may represent a surgical cavity, curvilinear mild root reduced diffusion anterior  superior to the superficial component were noted the deeper portion demonstrates central restricted diffusion.  T2 FLAIR hyperintensity was present without significant mass effect.  Also a 9 mm enhancing subcortical/cortical lesion of the right middle frontal gyrus with surrounding edema but no significant mass effect was also identified.  Primary ethmoid and right maxillary mucosal thickening was identified and orbits were unremarkable he has patchy mastoid fluid opacification.  He left the [place] [facility] AMA but presented again with similar symptoms on 05/19/2021 and repeat MRI with and without contrast showed an interval increase in the size of the 2 frontal lesions measuring up to a total of 2.4 cm.  There was increase in surrounding vasogenic edema with mass effect on the adjacent cerebral sulci and right lateral ventricle.  A third solid enhancing lesion in the left middle frontal gyrus was similar in appearance.  Mucosal thickening again scattered throughout the paranasal sinuses with fluid level within the right maxillary sinus was noted clinically to appear consistent with acute sinusitis.  He tested positive for COVID-19 that day.  We have been asked to weigh in on the rationale for radiotherapy. ? ? ? ?PREVIOUS RADIATION THERAPY: Yes  ? ?Dates and details unknown however as per HPI he has received radiation to his chest and on at least 2 occasions to his brain. ? ?PAST MEDICAL HISTORY:  ?Past Medical History:  ?Diagnosis Date  ? CAD (coronary artery disease)   ? History of brain cancer   ? HLD (hyperlipidemia)   ? Hypertension   ? PAD (peripheral artery disease) ([place])   ?   ? ? ?PAST SURGICAL  HISTORY:History reviewed. No pertinent surgical history. ? ? ?FAMILY HISTORY:  ?Family History  ?Problem Relation Age of Onset  ? Heart disease Mother   ? Heart disease Father   ? ? ? ?SOCIAL HISTORY:  reports that he has been smoking cigarettes. He has been smoking an average of 1 pack per day. He has never used smokeless  tobacco. He reports current alcohol use. He reports that he does not use drugs.  The patient had been living with his brother in [place].  He relocated recently and currently lives in [place] his daughter [place] and her family.  [place] maintains his schedule and is his caregiver and requests to be contacted about changes in his health care for clinical scenario. ? ? ?ALLERGIES: Patient has no known allergies. ? ? ?MEDICATIONS:  ?Current Facility-Administered Medications  ?Medication Dose [place] Frequency Provider Last Rate Last Admin  ? acetaminophen (TYLENOL) tablet 650 mg  650 mg Oral Q6H PRN Atway, Roger N, DO      ? Or  ? acetaminophen (TYLENOL) suppository 650 mg  650 mg Rectal Q6H PRN Atway, Roger N, DO      ? dexamethasone (DECADRON) injection 6 mg  6 mg Intravenous Q6H Atway, Roger N, DO   6 mg at 05/22/21 0500  ? enoxaparin (LOVENOX) injection 40 mg  40 mg Subcutaneous Q24H Atway, Roger N, DO   40 mg at 05/21/21 1812  ? levETIRAcetam (KEPPRA) IVPB 500 mg/100 mL premix  500 mg Intravenous Q12H Curatolo, Adam, DO 400 mL/hr at 05/22/21 0425 500 mg at 05/22/21 0425  ? metoCLOPramide (REGLAN) injection 10 mg  10 mg Intravenous Q8H [place], Roger Sousa, MD   10 mg at 05/22/21 0500  ? nicotine (NICODERM CQ - dosed in mg/24 hours) patch 21 mg  21 mg Transdermal Daily Roger Schuller, MD   21 mg at 05/21/21 1018  ? senna-docusate (Senokot-S) tablet 1 tablet  1 tablet Oral QHS PRN Atway, Roger N, DO      ? ? ? ?REVIEW OF SYSTEMS: On review of systems, the patient has been started on steroids and it sounds as though his left upper extremity strength is significantly improved from Dr. Clarice Solis notes.  He continues at a dose of Dexamethasone 6 mg every 6 hours.  His daughter states that he is not able to engage in detailed parts of conversation at this time given the confusion from his brain findings.  No other clinical complaints are verbalized by his daughter. ? ?PHYSICAL EXAM:  ?Wt Readings from Last 3 Encounters:   ?05/19/21 172 lb 9.9 oz (78.3 kg)  ?05/05/21 182 lb (82.6 kg)  ? ?Temp Readings from Last 3 Encounters:  ?05/22/21 97.7 ?F (36.5 ?C) (Oral)  ?05/05/21 98.6 ?F (37 ?C) (Oral)  ?08/31/19 (!) 97.5 ?F (36.4 ?C) (Oral)  ? ?BP Readings from Last 3 Encounters:  ?05/22/21 113/68  ?05/05/21 120/82  ?08/31/19 125/73  ? ?Pulse Readings from Last 3 Encounters:  ?05/22/21 75  ?05/05/21 96  ?08/31/19 68  ? ?Pain Assessment ?Pain Score: 0-No pain/10 ? ?Unable to assess given encounter type. ? ? ? ?ECOG = 1 ? ?0 - Asymptomatic (Fully active, able to carry on all predisease activities without restriction) ? ?1 - Symptomatic but completely ambulatory (Restricted in physically strenuous activity but ambulatory and able to carry out work of a light or sedentary nature. For example, light housework, office work) ? ?2 - Symptomatic, <50% in bed during the day (Ambulatory and capable of all self care but unable to  carry out any work activities. Up and about more than 50% of waking hours) ? ?3 - Symptomatic, >50% in bed, but not bedbound (Capable of only limited self-care, confined to bed or chair 50% or more of waking hours) ? ?4 - Bedbound (Completely disabled. Cannot carry on any self-care. Totally confined to bed or chair) ? ?5 - Death ? ? Oken MM, Creech RH, Tormey DC, et al. [phone]). "Toxicity and response criteria of the [facility]". [place] Oncol. 5 (6): 649-55 ? ? ? ?LABORATORY DATA:  ?Lab Results  ?Component Value Date  ? WBC 4.1 05/21/2021  ? HGB 12.8 (L) 05/21/2021  ? HCT 37.6 (L) 05/21/2021  ? MCV 90.2 05/21/2021  ? PLT 141 (L) 05/21/2021  ? ?Lab Results  ?Component Value Date  ? NA 132 (L) 05/21/2021  ? K 3.9 05/21/2021  ? CL 99 05/21/2021  ? CO2 24 05/21/2021  ? ?Lab Results  ?Component Value Date  ? ALT 35 05/19/2021  ? AST 53 (H) 05/19/2021  ? ALKPHOS 85 05/19/2021  ? BILITOT 0.7 05/19/2021  ? ?  ? ?RADIOGRAPHY: CT HEAD WO CONTRAST ? ?Result Date: 05/19/2021 ?CLINICAL DATA:  Neuro deficit,  acute, stroke suspected. Facial droop. Left-sided weakness. History of brain tumor. EXAM: CT HEAD WITHOUT CONTRAST TECHNIQUE: Contiguous axial images were obtained from the base of the skull through the vertex with

## 2021-05-22 NOTE — Hospital Course (Addendum)
Feels good this morning. He is ready to go home today.  ?

## 2021-05-22 NOTE — Progress Notes (Addendum)
? ?Subjective:  ?Patient seen alone at the bedside. Had shortness of breath and chills overnight, needed oxygen. Has not been feeling too well since but was able to sleep. ? ?Hiccups are back and forth. Having some nausea still. ? ?Objective: ? ?Vital signs in last 24 hours: ?Vitals:  ? 05/21/21 1610 05/21/21 1955 05/21/21 2334 05/22/21 0341  ?BP: 130/69 115/62 125/65 127/69  ?Pulse: 65 68 (!) 58 (!) 50  ?Resp:  18 20 19   ?Temp: 98.2 ?F (36.8 ?C) 98 ?F (36.7 ?C) 97.9 ?F (36.6 ?C) 98 ?F (36.7 ?C)  ?TempSrc: Oral Oral Oral Oral  ?SpO2: 96% 95% 95% 99%  ?Weight:      ?Height:      ? ?Physical Exam: ?General: Chronically ill-appearing male in NAD with [place] beanie on head and [place] in place; some hiccups intermittently ?HENT: normocephalic, atraumatic, external nares and ears appear normal ?EYES: conjunctiva non-erythematous, no scleral icterus ?Pulmonary: normal work of breathing on nasal cannula ?Neurological: Awake, alert and oriented x3.  EOMI, all visual fields intact. Facial symmetry at rest but minimal residual left-sided facial droop when smiling. Strength 5/5 RUE, 4+/5 LUE, 4/5 RLE, and 3+/5 LLE ?Psych: normal affect and behavior  ? ?Assessment/Plan: ? ?Principal Problem: ?  Stroke-like symptoms ?Active Problems: ?  Stroke [facility]) ?  Lung cancer metastatic to brain [facility]) ?  Fever ? ?#Stroke-like symptoms ?#Lung cancer with brain metastasis  ?#Cerebral edema ?Patient with improved left sided facial droop and left arm and leg weakness. Patient has a history of lung cancer with mets to his brain and is receiving immune therapy through the [place] (last received treatment 3/16) and also has plans to begin radiation therapy to his brain. CT head in the ED showed worsening edema in the R>L cerebral hemispheres with new trace leftward midline shift- no acute intracranial hemorrhage was identified. Patient also had a low grade fever of 100.5 in the ED, and no longer febrile. Patient does not clinically appear septic and  has no signs of infection on UA or CXR, has no s/s of meningitis, and also has no leukocytosis and lactic acid level is within normal limits. MRI brain with increase in size of one of the 2 lesions previously seen, which now shows well-defined T2 hypointense halo and increased surrounding vasogenic edema concerning for abscess. Blood (48 hrs) and urine cx negative. ?- Per Neurosurgery, patient to have a stereotactic biopsy or drainage of the R frontal lobe lesion Tuesday. ?- NPO at midnight tonight ?- Continue Decadron 6 mg q6h  ?- Daily CBC, BMP ?  ?#Hiccups ?Patient with ongoing intermittent hiccups prior to admission.  ?-Continue Reglan 10mg  q8h. Will continue to monitor ? 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 Patient has been adherent with this medication and has not had any seizure like activity. Keppra level 4.3. ?- Continue Keppra 500 mg IV bid.  Discussed with pharmacy, no need to increase Keppra at this time. ?- Seizure precautions ?  ?#CAD s/p CABG ?#PAD s/p PCI with DES ?Will hold off on resuming statin/aspirin, as we are unsure if these were recently discontinued in the setting of his chemotherapy/immune therapy treatment (these were documented as discontinued during his last hospitalization).  ? ?Prior to Admission Living Arrangement: Home ?Anticipated Discharge Location: Pending clinical improvement and PT/OT recs ?Barriers to Discharge: Procedure ?Dispo: Anticipated discharge in approximately 5-7 day(s).  ? ?  Rosezetta Schlatter, MD ?05/22/2021, 6:38 AM ?Pager: [phone] ?After 5pm on weekdays and 1pm on weekends: On Call pager [phone]  ?

## 2021-05-22 NOTE — Progress Notes (Addendum)
?  Transition of Care (TOC) Screening Note ? ? ?Patient Details  ?Name: [place] ?Date of Birth: Aug 25, 1955 ? ? ?Transition of Care (TOC) CM/SW Contact:    ?Pollie Friar, RN ?Phone Number: ?05/22/2021, 10:10 AM ? ? ? ?Transition of Care Department [facility]) has reviewed patient and no TOC needs have been identified at this time. We will continue to monitor patient advancement through interdisciplinary progression rounds. If new patient transition needs arise, please place a TOC consult. ?PCP: Dr Venora Maples at [facility] ?SW: Drue Stager [phone] ext [phone] ?  ?

## 2021-05-22 NOTE — H&P (View-Only) (Signed)
Patient ID: [place], male   DOB: 02-03-1956, 65 y.o.   MRN: [id number] ?Patient has mild left facial but left-sided hemiparesis seems all but resolved.  He has not been out of bed since hospitalized.  I believe having him ambulate and move about with PT and OT would be of benefit. ? ?Plan is for stereotactic biopsy tomorrow.  He will have a stereotactic head CT tonight.  I will order consent and n.p.o. after midnight. ?

## 2021-05-23 ENCOUNTER — Encounter (HOSPITAL_COMMUNITY)
Admission: EM | Disposition: A | Discharge status: Home / Self Care | Payer: Self-pay | Source: Home / Self Care | Attending: Internal Medicine

## 2021-05-23 ENCOUNTER — Other Ambulatory Visit: Payer: Self-pay

## 2021-05-23 ENCOUNTER — Inpatient Hospital Stay (HOSPITAL_COMMUNITY): Payer: No Typology Code available for payment source | Admitting: Anesthesiology

## 2021-05-23 ENCOUNTER — Inpatient Hospital Stay (HOSPITAL_COMMUNITY): Payer: No Typology Code available for payment source

## 2021-05-23 DIAGNOSIS — R531 Weakness: Secondary | ICD-10-CM | POA: Diagnosis not present

## 2021-05-23 DIAGNOSIS — G939 Disorder of brain, unspecified: Secondary | ICD-10-CM | POA: Diagnosis not present

## 2021-05-23 DIAGNOSIS — I2510 Atherosclerotic heart disease of native coronary artery without angina pectoris: Secondary | ICD-10-CM

## 2021-05-23 DIAGNOSIS — U071 COVID-19: Secondary | ICD-10-CM | POA: Diagnosis not present

## 2021-05-23 DIAGNOSIS — I10 Essential (primary) hypertension: Secondary | ICD-10-CM | POA: Diagnosis not present

## 2021-05-23 DIAGNOSIS — Z85841 Personal history of malignant neoplasm of brain: Secondary | ICD-10-CM

## 2021-05-23 DIAGNOSIS — R2990 Unspecified symptoms and signs involving the nervous system: Secondary | ICD-10-CM | POA: Diagnosis not present

## 2021-05-23 HISTORY — PX: FRAMELESS  BIOPSY WITH BRAINLAB: SHX6879

## 2021-05-23 HISTORY — PX: APPLICATION OF CRANIAL NAVIGATION: SHX6578

## 2021-05-23 LAB — BASIC METABOLIC PANEL
Anion gap: 10 (ref 5–15)
BUN: 19 mg/dL (ref 8–23)
CO2: 24 mmol/L (ref 22–32)
Calcium: 8.4 mg/dL — ABNORMAL LOW (ref 8.9–10.3)
Chloride: 101 mmol/L (ref 98–111)
Creatinine, Ser: 0.75 mg/dL (ref 0.61–1.24)
GFR, Estimated: >60 mL/min (ref >60)
Glucose, Bld: 152 mg/dL — ABNORMAL HIGH (ref 70–99)
Potassium: 3.9 mmol/L (ref 3.5–5.1)
Sodium: 135 mmol/L (ref 135–145)

## 2021-05-23 LAB — PROTIME-INR
INR: 0.9 (ref 0.8–1.2)
Prothrombin Time: 12.5 seconds (ref 11.4–15.2)

## 2021-05-23 LAB — MRSA NEXT GEN BY PCR, NASAL: MRSA by PCR Next Gen: NOT DETECTED

## 2021-05-23 IMAGING — CT CT HEAD W/O CM
3 of 4 series · 15 of 47 positions shown, 18 images · non-contrast
Comparison: CT [DATE].

CLINICAL DATA: Surgical planning.



[Series 3: head 1.0 h30s · axial · 0.52mm/px · z∈[-226,-52]mm · 9 of 210 slices shown, 12 images]
[im 18/210  brain]
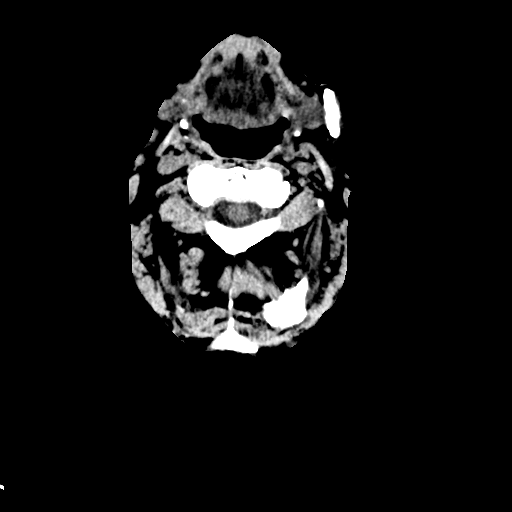
[im 18/210  bone]
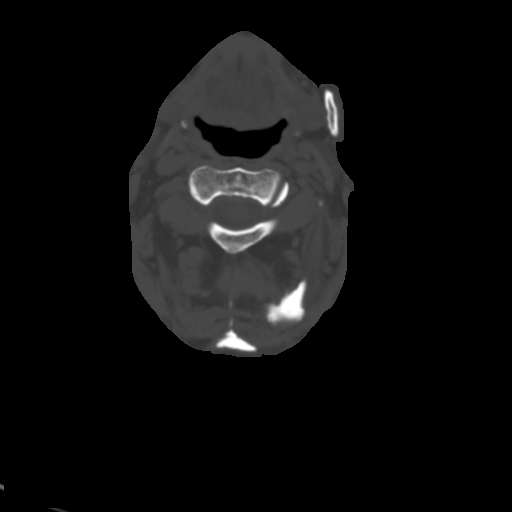
[im 35/210  brain]
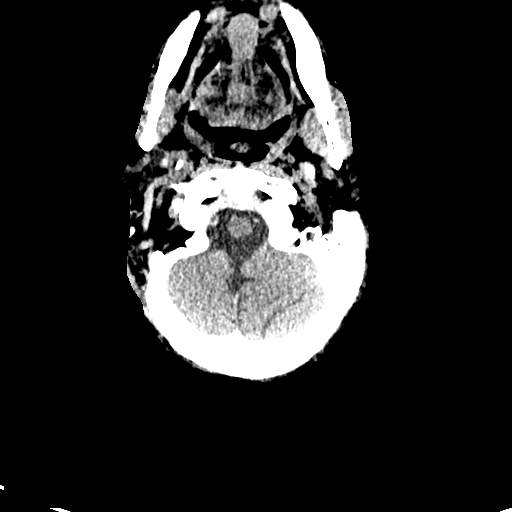
[im 61/210  brain]
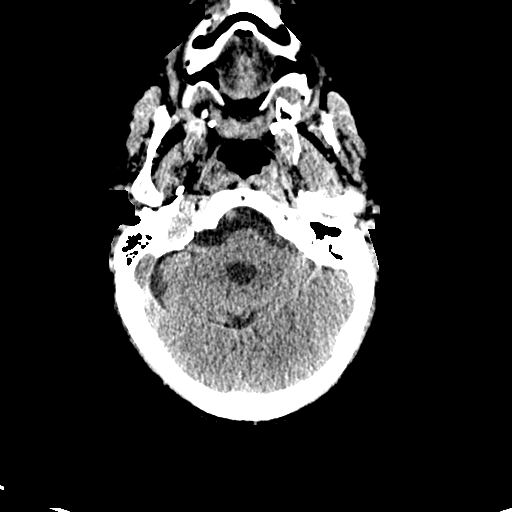
[im 79/210  brain]
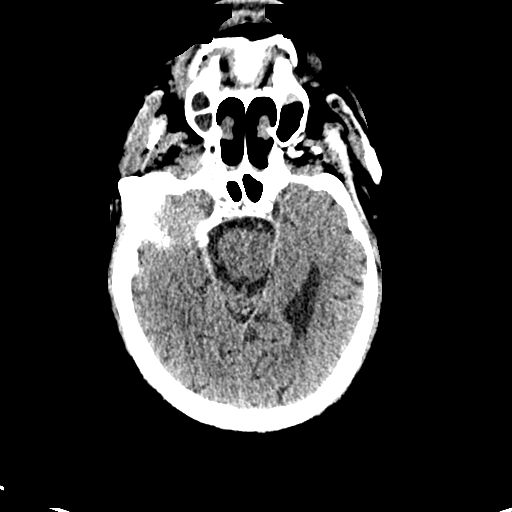
[im 105/210  brain]
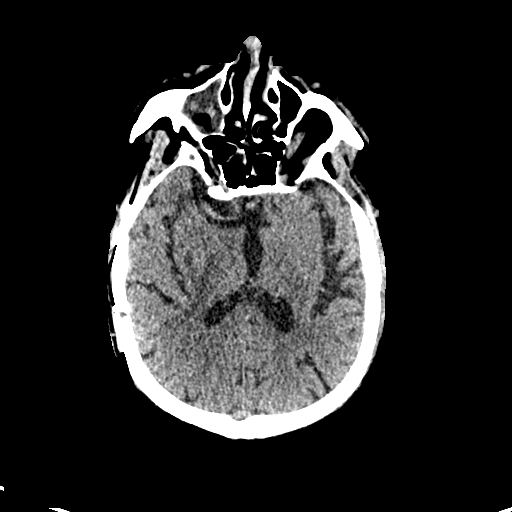
[im 105/210  bone]
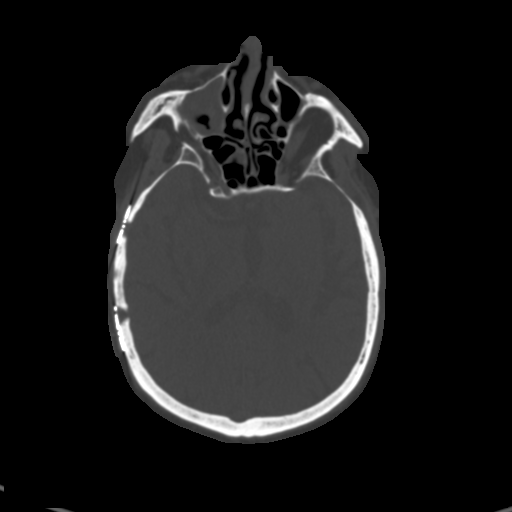
[im 131/210  brain]
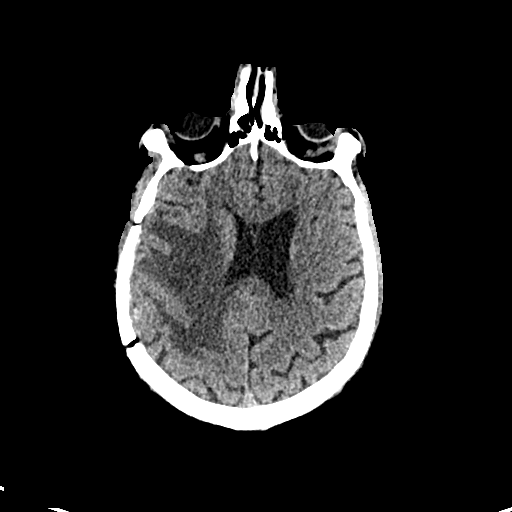
[im 149/210  brain]
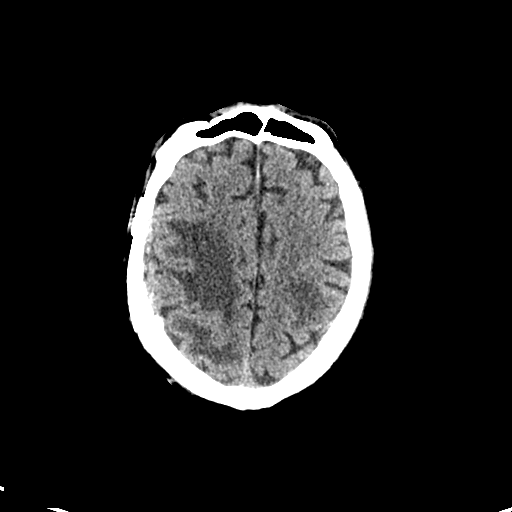
[im 175/210  brain]
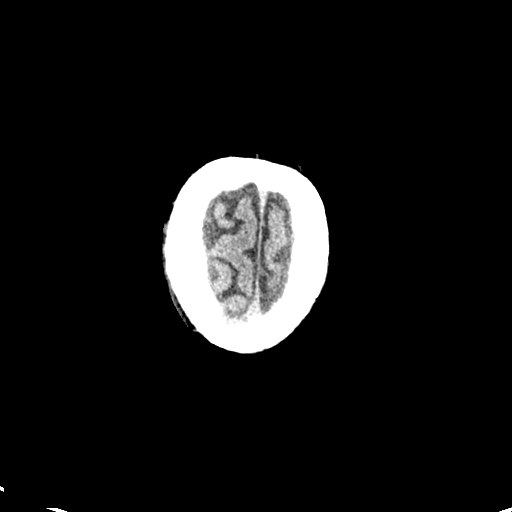
[im 192/210  brain]
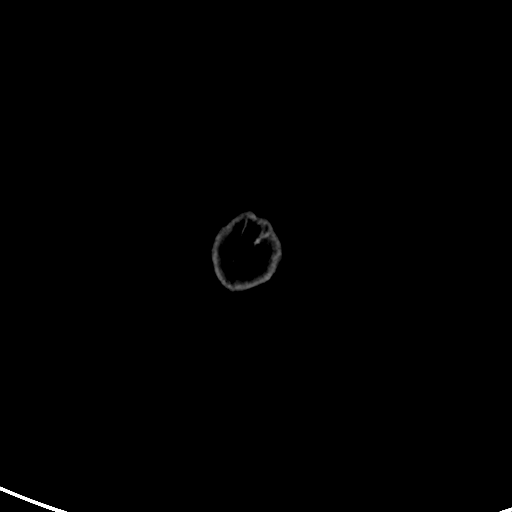
[im 192/210  bone]
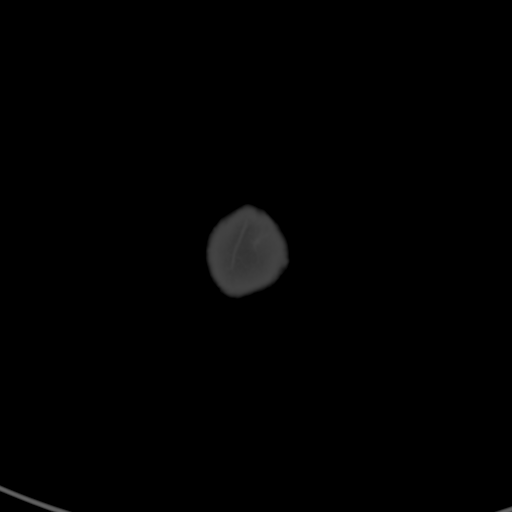

[Series 6: cor cor · coronal · 0.40mm/px · 3 of 51 slices shown]
[im 17/51  brain]
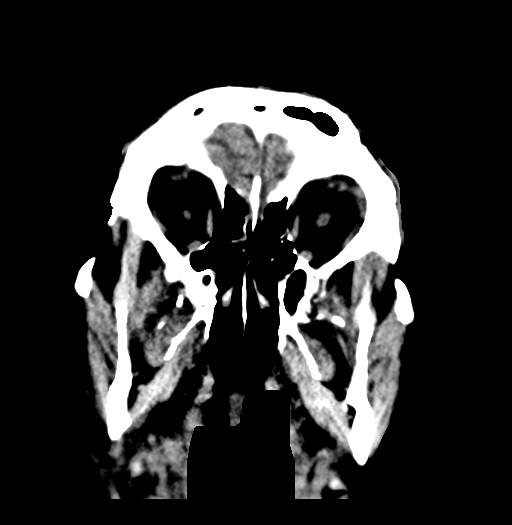
[im 23/51  brain]
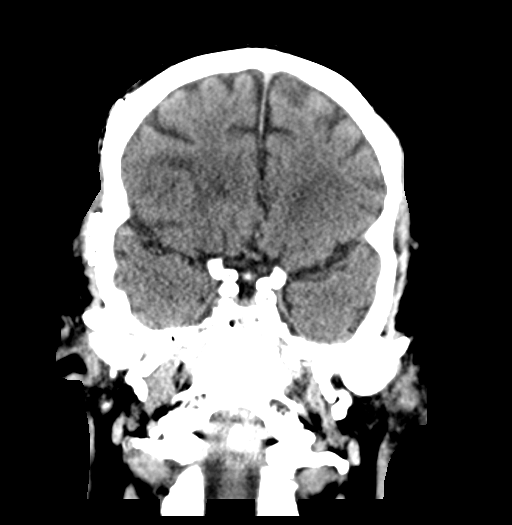
[im 28/51  brain]
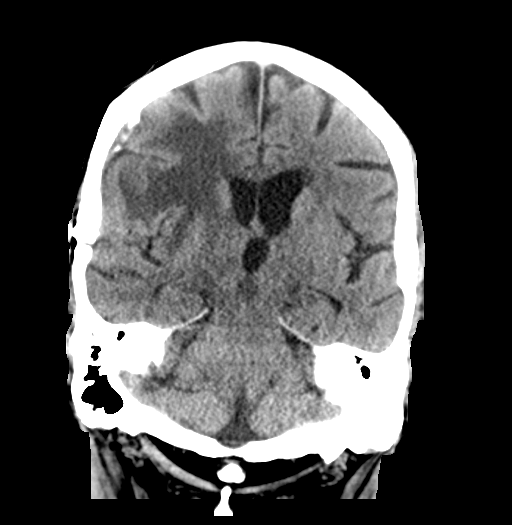

[Series 7: sag · sagittal · 0.41mm/px · 3 of 40 slices shown]
[im 14/40  brain]
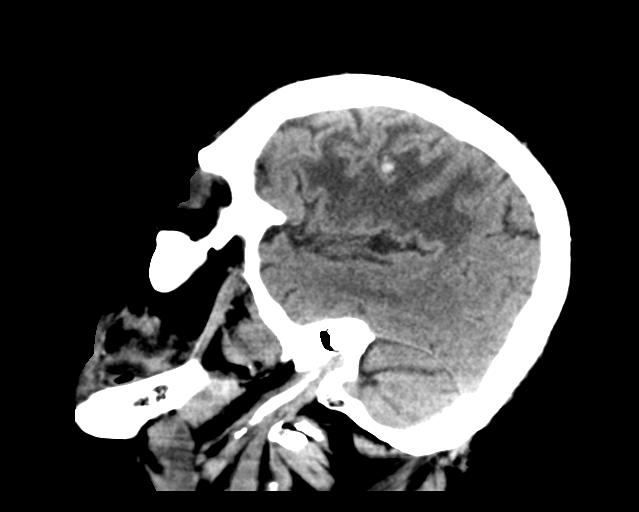
[im 20/40  brain]
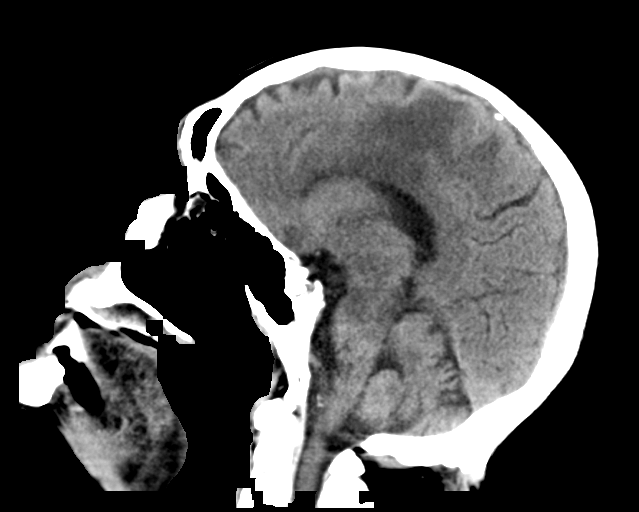
[im 27/40  brain]
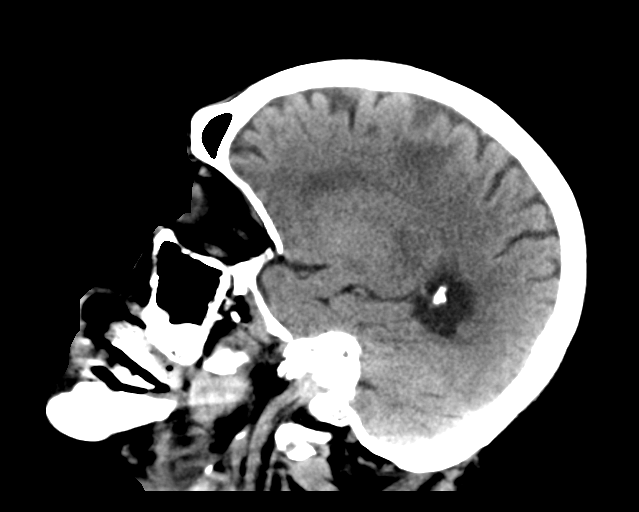

[15 of 47 positions shown; findings below may reference images not displayed]

FINDINGS: Brain: Redemonstrated changes of right pterional craniotomy with
subjacent small volume of recent hemorrhage. Similar extensive
adjacent vasogenic edema and mass effect, better characterized on
recent MRI. Similar extension of edema into the white matter tracts
at the level basal ganglia and some milder edema on the left.
Similar small, subcentimeter focus of hyperdensity within the cortex
in this region which may represent calcification or hemorrhage. No
evidence of new/interval acute large vascular territory infarct or
hydrocephalus. Similar effacement the right lateral ventricle.

Vascular: No hyperdense vessel identified.

Skull: Right pterional craniotomy.  No acute fracture.

Sinuses/Orbits: Paranasal sinus mucosal thickening and right
maxillary sinus fluid. No acute orbital findings.

Other: No mastoid effusions.
IMPRESSION: No substantial change in extensive right greater than left cerebral
edema with similar mass effect. Similar small volume of hemorrhage.
Findings better characterized on recent MRI.

## 2021-05-23 SURGERY — FRAMELESS BIOPSY WITH BRAINLAB
Anesthesia: General

## 2021-05-23 MED ORDER — ROCURONIUM BROMIDE 10 MG/ML (PF) SYRINGE
PREFILLED_SYRINGE | INTRAVENOUS | Status: DC | PRN
  Administered 2021-05-23: 40 mg via INTRAVENOUS
  Administered 2021-05-23: 60 mg via INTRAVENOUS

## 2021-05-23 MED ORDER — BUPIVACAINE HCL (PF) 0.25 % IJ SOLN
INTRAMUSCULAR | Status: AC
  Filled 2021-05-23: qty 30

## 2021-05-23 MED ORDER — SUGAMMADEX SODIUM 200 MG/2ML IV SOLN
INTRAVENOUS | Status: DC | PRN
  Administered 2021-05-23: 200 mg via INTRAVENOUS

## 2021-05-23 MED ORDER — LIDOCAINE 2% (20 MG/ML) 5 ML SYRINGE
INTRAMUSCULAR | Status: DC | PRN
  Administered 2021-05-23: 60 mg via INTRAVENOUS

## 2021-05-23 MED ORDER — LABETALOL HCL 5 MG/ML IV SOLN: 10.0000 mg | INTRAVENOUS | Status: DC | PRN

## 2021-05-23 MED ORDER — ONDANSETRON HCL 4 MG PO TABS: 4.0000 mg | ORAL_TABLET | ORAL | Status: DC | PRN

## 2021-05-23 MED ORDER — SODIUM CHLORIDE 0.9 % IV SOLN
INTRAVENOUS | Status: DC | PRN
Start: 2021-05-23 — End: 2021-05-23

## 2021-05-23 MED ORDER — FENTANYL CITRATE (PF) 250 MCG/5ML IJ SOLN
INTRAMUSCULAR | Status: AC
  Filled 2021-05-23: qty 5

## 2021-05-23 MED ORDER — BACITRACIN ZINC 500 UNIT/GM EX OINT
TOPICAL_OINTMENT | CUTANEOUS | Status: AC
  Filled 2021-05-23: qty 28.35

## 2021-05-23 MED ORDER — ALBUTEROL SULFATE HFA 108 (90 BASE) MCG/ACT IN AERS
1.0000 | INHALATION_SPRAY | Freq: Four times a day (QID) | RESPIRATORY_TRACT | Status: DC | PRN
  Administered 2021-05-24: 1 via RESPIRATORY_TRACT
  Filled 2021-05-23: qty 6.7

## 2021-05-23 MED ORDER — THROMBIN 5000 UNITS EX SOLR
CUTANEOUS | Status: AC
  Filled 2021-05-23: qty 5000

## 2021-05-23 MED ORDER — PHENYLEPHRINE HCL-NACL 20-0.9 MG/250ML-% IV SOLN
INTRAVENOUS | Status: DC | PRN
  Administered 2021-05-23: 30 ug/min via INTRAVENOUS

## 2021-05-23 MED ORDER — LIDOCAINE-EPINEPHRINE 1 %-1:100000 IJ SOLN
INTRAMUSCULAR | Status: AC
  Filled 2021-05-23: qty 1

## 2021-05-23 MED ORDER — FENTANYL CITRATE (PF) 250 MCG/5ML IJ SOLN
INTRAMUSCULAR | Status: DC | PRN
  Administered 2021-05-23: 100 ug via INTRAVENOUS
  Administered 2021-05-23: 50 ug via INTRAVENOUS

## 2021-05-23 MED ORDER — CHLORHEXIDINE GLUCONATE CLOTH 2 % EX PADS
6.0000 | MEDICATED_PAD | Freq: Every day | CUTANEOUS | Status: DC
  Administered 2021-05-23 – 2021-05-24 (×2): 6 via TOPICAL

## 2021-05-23 MED ORDER — MUPIROCIN CALCIUM 2 % EX CREA
TOPICAL_CREAM | CUTANEOUS | Status: DC | PRN
  Administered 2021-05-23: 1 via TOPICAL

## 2021-05-23 MED ORDER — PROPOFOL 10 MG/ML IV BOLUS
INTRAVENOUS | Status: DC | PRN
  Administered 2021-05-23: 100 mg via INTRAVENOUS

## 2021-05-23 MED ORDER — LEVETIRACETAM 500 MG PO TABS
500.0000 mg | ORAL_TABLET | Freq: Two times a day (BID) | ORAL | Status: DC
  Administered 2021-05-23 – 2021-05-25 (×4): 500 mg via ORAL
  Filled 2021-05-23 (×4): qty 1

## 2021-05-23 MED ORDER — LIDOCAINE-EPINEPHRINE 1 %-1:100000 IJ SOLN
INTRAMUSCULAR | Status: DC | PRN
  Administered 2021-05-23: 2.5 mL

## 2021-05-23 MED ORDER — BUPIVACAINE HCL (PF) 0.25 % IJ SOLN
INTRAMUSCULAR | Status: DC | PRN
  Administered 2021-05-23: 2.5 mL

## 2021-05-23 MED ORDER — PANTOPRAZOLE SODIUM 40 MG IV SOLR
40.0000 mg | Freq: Every day | INTRAVENOUS | Status: DC
  Administered 2021-05-23: 40 mg via INTRAVENOUS
  Filled 2021-05-23: qty 10

## 2021-05-23 MED ORDER — ONDANSETRON HCL 4 MG/2ML IJ SOLN
INTRAMUSCULAR | Status: DC | PRN
  Administered 2021-05-23: 4 mg via INTRAVENOUS

## 2021-05-23 MED ORDER — PROMETHAZINE HCL 25 MG PO TABS: 12.5000 mg | ORAL_TABLET | ORAL | Status: DC | PRN

## 2021-05-23 MED ORDER — THROMBIN 5000 UNITS EX SOLR
CUTANEOUS | Status: DC | PRN
  Administered 2021-05-23: 5000 [IU] via TOPICAL

## 2021-05-23 MED ORDER — PHENYLEPHRINE 40 MCG/ML (10ML) SYRINGE FOR IV PUSH (FOR BLOOD PRESSURE SUPPORT)
PREFILLED_SYRINGE | INTRAVENOUS | Status: DC | PRN
  Administered 2021-05-23 (×3): 40 ug via INTRAVENOUS
  Administered 2021-05-23: 80 ug via INTRAVENOUS
  Administered 2021-05-23: 40 ug via INTRAVENOUS

## 2021-05-23 MED ORDER — BISACODYL 10 MG RE SUPP: 10.0000 mg | Freq: Every day | RECTAL | Status: DC | PRN

## 2021-05-23 MED ORDER — ONDANSETRON HCL 4 MG/2ML IJ SOLN: 4.0000 mg | INTRAMUSCULAR | Status: DC | PRN

## 2021-05-23 MED ORDER — LIDOCAINE HCL URETHRAL/MUCOSAL 2 % EX GEL
1.0000 "application " | Freq: Once | CUTANEOUS | Status: DC
  Filled 2021-05-23: qty 6

## 2021-05-23 MED ORDER — LEVETIRACETAM IN NACL 500 MG/100ML IV SOLN: 500.0000 mg | Freq: Two times a day (BID) | INTRAVENOUS | Status: DC

## 2021-05-23 MED ORDER — DEXAMETHASONE 4 MG PO TABS
4.0000 mg | ORAL_TABLET | Freq: Three times a day (TID) | ORAL | Status: DC
  Administered 2021-05-23 – 2021-05-25 (×5): 4 mg via ORAL
  Filled 2021-05-23 (×5): qty 1

## 2021-05-23 MED ORDER — SENNA 8.6 MG PO TABS
1.0000 | ORAL_TABLET | Freq: Two times a day (BID) | ORAL | Status: DC
  Administered 2021-05-24: 8.6 mg via ORAL
  Filled 2021-05-23: qty 1

## 2021-05-23 MED ORDER — FLEET ENEMA 7-19 GM/118ML RE ENEM
1.0000 | ENEMA | Freq: Once | RECTAL | Status: DC | PRN
Start: 2021-05-23 — End: 2021-05-25

## 2021-05-23 MED ORDER — DOCUSATE SODIUM 100 MG PO CAPS
100.0000 mg | ORAL_CAPSULE | Freq: Two times a day (BID) | ORAL | Status: DC
  Administered 2021-05-24 – 2021-05-25 (×2): 100 mg via ORAL
  Filled 2021-05-23 (×2): qty 1

## 2021-05-23 MED ORDER — POLYETHYLENE GLYCOL 3350 17 G PO PACK: 17.0000 g | PACK | Freq: Every day | ORAL | Status: DC | PRN

## 2021-05-23 SURGICAL SUPPLY — 36 items
BAG COUNTER SPONGE SURGICOUNT (BAG) ×2 IMPLANT
BIT DRILL SHORT 3X80 (BIT) ×2 IMPLANT
BLADE CLIPPER SURG (BLADE) IMPLANT
BNDG ADH 1X3 SHEER STRL LF (GAUZE/BANDAGES/DRESSINGS) IMPLANT
CANISTER SUCT 3000ML PPV (MISCELLANEOUS) ×2 IMPLANT
DURAPREP 6ML APPLICATOR 50/CS (WOUND CARE) IMPLANT
ELECT REM PT RETURN 9FT ADLT (ELECTROSURGICAL) ×2
ELECTRODE REM PT RTRN 9FT ADLT (ELECTROSURGICAL) ×1 IMPLANT
GAUZE 4X4 16PLY ~~LOC~~+RFID DBL (SPONGE) ×2 IMPLANT
GLOVE EXAM NITRILE XL STR (GLOVE) IMPLANT
GLOVE SURG LTX SZ8.5 (GLOVE) ×2 IMPLANT
GLOVE SURG UNDER POLY LF SZ8.5 (GLOVE) ×2 IMPLANT
GOWN STRL REUS W/ TWL LRG LVL3 (GOWN DISPOSABLE) IMPLANT
GOWN STRL REUS W/ TWL XL LVL3 (GOWN DISPOSABLE) ×1 IMPLANT
GOWN STRL REUS W/TWL 2XL LVL3 (GOWN DISPOSABLE) ×2 IMPLANT
GOWN STRL REUS W/TWL LRG LVL3 (GOWN DISPOSABLE) ×1
GOWN STRL REUS W/TWL XL LVL3 (GOWN DISPOSABLE) ×1
KIT BASIN OR (CUSTOM PROCEDURE TRAY) ×2 IMPLANT
KIT NDL BIOPSY 1.8X235 CRAN (NEEDLE) ×1 IMPLANT
KIT NEEDLE BIOPSY 1.8X235 CRAN (NEEDLE) ×2 IMPLANT
KIT TURNOVER KIT B (KITS) ×2 IMPLANT
KIT VARIOGUIDE DRILL 1.9 (KITS) ×2 IMPLANT
MARKER SPHERE PSV REFLC 13MM (MARKER) ×10 IMPLANT
NDL HYPO 25X1 1.5 SAFETY (NEEDLE) ×1 IMPLANT
NEEDLE HYPO 25X1 1.5 SAFETY (NEEDLE) ×2 IMPLANT
NS IRRIG 1000ML POUR BTL (IV SOLUTION) ×2 IMPLANT
PACK CRANIOTOMY CUSTOM (CUSTOM PROCEDURE TRAY) ×2 IMPLANT
PAD ARMBOARD 7.5X6 YLW CONV (MISCELLANEOUS) ×7 IMPLANT
STAPLER SKIN PROX WIDE 3.9 (STAPLE) ×2 IMPLANT
SUT VIC AB 4-0 RB1 18 (SUTURE) ×2 IMPLANT
SUT VICRYL 3 0 RAPIDE (SUTURE) IMPLANT
SYR 3ML LL SCALE MARK (SYRINGE) ×6 IMPLANT
SYR BULB EAR ULCER 3OZ GRN STR (SYRINGE) ×2 IMPLANT
TOWEL GREEN STERILE (TOWEL DISPOSABLE) ×2 IMPLANT
TOWEL GREEN STERILE FF (TOWEL DISPOSABLE) ×2 IMPLANT
WATER STERILE IRR 1000ML POUR (IV SOLUTION) ×2 IMPLANT

## 2021-05-23 NOTE — Anesthesia Preprocedure Evaluation (Addendum)
Anesthesia Evaluation  ?Patient identified by MRN, date of birth, ID band ?Patient awake ? ? ? ?Reviewed: ?Allergy & Precautions, NPO status , Patient's Chart, lab work & pertinent test results ? ?Airway ?Mallampati: II ? ?TM Distance: >3 FB ?Neck ROM: Full ? ? ? Dental ? ?(+) Lower Dentures ?  ?Pulmonary ?Current Smoker and Patient abstained from smoking.,  ?  ?Pulmonary exam normal ?breath sounds clear to auscultation ? ? ? ? ? ? Cardiovascular ?hypertension, + CAD, + Cardiac Stents, + CABG and + Peripheral Vascular Disease  ?Normal cardiovascular exam ?Rhythm:Regular Rate:Normal ? ?ECG: ST, rate 111 ?  ?Neuro/Psych ?Seizures -,  CVA negative psych ROS  ? GI/Hepatic ?negative GI ROS, Neg liver ROS,   ?Endo/Other  ?negative endocrine ROS ? Renal/GU ?negative Renal ROS  ? ?  ?Musculoskeletal ?negative musculoskeletal ROS ?(+)  ? Abdominal ?  ?Peds ? Hematology ? ?(+) Blood dyscrasia, anemia ,   ?Anesthesia Other Findings ?Lung cancer with brain mets ?COVID positive ? Reproductive/Obstetrics ? ?  ? ? ? ? ? ? ? ? ? ? ? ? ? ?  ?  ? ? ? ? ? ? ? ?Anesthesia Physical ?Anesthesia Plan ? ?ASA: 4 ? ?Anesthesia Plan: General  ? ?Post-op Pain Management:   ? ?Induction: Intravenous ? ?PONV Risk Score and Plan: 1 and Ondansetron, Dexamethasone and Treatment may vary due to age or medical condition ? ?Airway Management Planned: Oral ETT ? ?Additional Equipment:  ? ?Intra-op Plan:  ? ?Post-operative Plan: Extubation in OR ? ?Informed Consent: I have reviewed the patients History and Physical, chart, labs and discussed the procedure including the risks, benefits and alternatives for the proposed anesthesia with the patient or authorized representative who has indicated his/her understanding and acceptance.  ? ?Patient has DNR.  ?Discussed DNR with patient, Suspend DNR and Discussed DNR with power of attorney. ?  ? ? ?Plan Discussed with: CRNA ? ?Anesthesia Plan Comments: (Potential arterial line  placement discussed)  ? ? ? ? ? ?Anesthesia Quick Evaluation ? ?

## 2021-05-23 NOTE — Op Note (Signed)
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 ?Postoperative diagnosis: Same ?Procedure: Stereotactic brain biopsy right frontal lesion. 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  A few weeks ago he presented with an apparent seizure and an MRI demonstrated presence of a subtle lesion in the right frontal area where he has had previous surgery and radiation.  Event presented with acute onset of left-sided weakness about 5 days ago and was reevaluated with a repeat MRI that demonstrates a markedly enlarged lesion from the previous scan now with significant diffusion.  There is concern that this may represent an abscess.  Patient has not been febrile.  He responded well to Decadron secondary to all the vasogenic edema.  A stereotactic biopsy is now being performed to better identify the nature of the lesion. ? ?Procedure: The patient was brought to the operating room and placed on the table in supine position after the smooth induction of general endotracheal anesthesia he was placed in the 3 pin headrest curved navigation system was applied to the patient's head rest and we had preplanned an entry point at the targeted path towards lesion so as to create the least amount of disturbance of the surrounding tissues.  Then by applying the frame the entry area was chosen this area was prepped with alcohol DuraPrep and draped in a sterile fashion.  The area was infiltrated with local call anesthetic and a small vertical incision was created in the area.  The curved frame was then applied and stereotactic guidance was used to localize the direction of biopsy trajectory.  Then a drill was used to penetrate the skull  a pin was used to penetrate the dura and then the sidecutting needle was placed into the center of the lesion.  Biopsies were taken from all 4 quadrants.  Solid tumor was encountered.  This tissue was sent for further pathologic evaluation and to obtain cultures.  After an adequate amount of biopsies were obtained the stereotactic needle was withdrawn.  The frame was released.  The scalp was closed with 4-0 Vicryl in interrupted fashion and surgical staples in the skin.  Patient tolerated procedure well is returned to recovery room in stable condition.  Blood loss for the procedure was nil ?

## 2021-05-23 NOTE — Interval H&P Note (Signed)
History and Physical Interval Note: ? ?05/23/2021 ?2:41 PM ? ?Roger Solis  has presented today for surgery, with the diagnosis of Brain mets.  The various methods of treatment have been discussed with the patient and family. After consideration of risks, benefits and other options for treatment, the patient has consented to  Procedure(s): ?Stereotactic needle biopsy of the brain w/brainlab (N/A) ?APPLICATION OF CRANIAL NAVIGATION (N/A) as a surgical intervention.  The patient's history has been reviewed, patient examined, no change in status, stable for surgery.  I have reviewed the patient's chart and labs.  Questions were answered to the patient's satisfaction.   ? ? ?[name] ? ? ?

## 2021-05-23 NOTE — Anesthesia Procedure Notes (Signed)
Procedure Name: Intubation ?Date/Time: 05/23/2021 3:55 PM ?Performed by: Lorie Phenix, CRNA ?Pre-anesthesia Checklist: Patient identified, Emergency Drugs available, Suction available and Patient being monitored ?Patient Re-evaluated:Patient Re-evaluated prior to induction ?Oxygen Delivery Method: Circle system utilized ?Preoxygenation: Pre-oxygenation with 100% oxygen ?Induction Type: IV induction ?Ventilation: Mask ventilation without difficulty ?Laryngoscope Size: 4 and Mac ?Grade View: Grade I ?Tube type: Oral ?Number of attempts: 1 ?Airway Equipment and Method: Stylet ?Placement Confirmation: ETT inserted through vocal cords under direct vision, positive ETCO2 and breath sounds checked- equal and bilateral ?Secured at: 25 cm ?Tube secured with: Tape ?Dental Injury: Teeth and Oropharynx as per pre-operative assessment  ? ? ? ? ?

## 2021-05-23 NOTE — Evaluation (Signed)
Occupational Therapy Evaluation ?Patient Details ?Name: Roger Solis ?MRN: [id number] ?DOB: 03-31-1955 ?Today's Date: 05/23/2021 ? ? ?History of Present Illness 66 y.o. male presents to [facility] after seizure-like activity observed by daughter at home. Pt has a history of brain tumor, undergoing chemo and radiation. MRI reveals Right frontal contiguous enhancing lesions as well as 9 mm left frontal lesion with edema. PMH includes HTN, HLD, CAD s/p CABG, PAD s/p PCI w/ stents, primary lung cancer with brain metastasis. Pt admitted with similar symptoms 3/3 and pt left AMA.  pt now  scheduled for stereotactic biopsy on 3/21.  ? ?Clinical Impression ?  ?Pt admitted with the above diagnosis and has the deficits listed below. Pt would benefit from cont OT to increase independence with basic balance during all adls so pt can d/c home safely with his daughter.  Pt should be safe to d/c home with daughter available most of the time initially.  Pt will be restricted from driving due to recent seizure activity.  Will continue to see acutely to focus on adls and home skills up on his feet.  ?  ?   ? ?Recommendations for follow up therapy are one component of a multi-disciplinary discharge planning process, led by the attending physician.  Recommendations may be updated based on patient status, additional functional criteria and insurance authorization.  ? ?Follow Up Recommendations ? No OT follow up  ?  ?Assistance Recommended at Discharge Frequent or constant Supervision/Assistance  ?Patient can return home with the following A little help with bathing/dressing/bathroom;Assist for transportation;Assistance with cooking/housework ? ?  ?Functional Status Assessment ? Patient has had a recent decline in their functional status and demonstrates the ability to make significant improvements in function in a reasonable and predictable amount of time.  ?Equipment Recommendations ? None recommended by OT  ?  ?Recommendations for  Other Services   ? ? ?  ?Precautions / Restrictions Precautions ?Precautions: Other (comment) ?Precaution Comments: seizure/covid + ?Restrictions ?Weight Bearing Restrictions: No  ? ?  ? ?Mobility Bed Mobility ?Overal bed mobility: Independent ?  ?  ?  ?  ?  ?  ?  ?  ? ?Transfers ?Overall transfer level: Needs assistance ?Equipment used: 1 person hand held assist ?Transfers: Sit to/from Stand, Bed to chair/wheelchair/BSC ?Sit to Stand: Supervision ?Stand pivot transfers: Supervision ?  ?  ?  ?  ?General transfer comment: Supervision for safety. Pt's first time up since admission. ?  ? ?  ?Balance Overall balance assessment: Mild deficits observed, not formally tested ?  ?  ?  ?  ?  ?  ?  ?  ?  ?  ?  ?  ?  ?  ?  ?  ?  ?  ?   ? ?ADL either performed or assessed with clinical judgement  ? ?ADL Overall ADL's : Needs assistance/impaired ?Eating/Feeding: NPO (for procedure) ?Eating/Feeding Details (indicate cue type and reason): \ ?Grooming: Wash/dry hands;Wash/dry face;Oral care;Supervision/safety;Standing ?Grooming Details (indicate cue type and reason): Pt stood at sink to groom with no LOB leaning against sink ?Upper Body Bathing: Set up;Sitting ?  ?Lower Body Bathing: Supervison/ safety;Sit to/from stand;Cueing for safety ?  ?Upper Body Dressing : Set up;Sitting ?  ?Lower Body Dressing: Min guard;Sit to/from stand;Cueing for safety ?  ?Toilet Transfer: Min guard;Ambulation;Comfort height toilet ?  ?Toileting- Water quality scientist and Hygiene: Min guard;Sit to/from stand;Cueing for safety ?  ?  ?  ?Functional mobility during ADLs: Min guard ?General ADL Comments: Pt mildly unsteady when  on feet requring supervision.  Otherwise pt is fairly safe.  Family is available to assist at home.  ? ? ? ?Vision Baseline Vision/History: 0 No visual deficits ?Ability to See in Adequate Light: 0 Adequate ?Vision Assessment?: No apparent visual deficits  ?   ?Perception   ?  ?Praxis   ?  ? ?Pertinent Vitals/Pain Pain  Assessment ?Pain Assessment: Faces ?Pain Score: 4  ?Faces Pain Scale: Hurts little more ?Pain Location: bottom ?Pain Descriptors / Indicators: Sore ?Pain Intervention(s): Repositioned  ? ? ? ?Hand Dominance Right ?  ?Extremity/Trunk Assessment Upper Extremity Assessment ?Upper Extremity Assessment: Overall WFL for tasks assessed ?  ?Lower Extremity Assessment ?Lower Extremity Assessment: Defer to PT evaluation ?  ?Cervical / Trunk Assessment ?Cervical / Trunk Assessment: Normal ?  ?Communication Communication ?Communication: No difficulties ?  ?Cognition Arousal/Alertness: Awake/alert ?Behavior During Therapy: [facility] for tasks assessed/performed ?Overall Cognitive Status: Within Functional Limits for tasks assessed ?  ?  ?  ?  ?  ?  ?  ?  ?  ?  ?  ?  ?  ?  ?  ?  ?General Comments: Overall basic cognition intact.  Would benefit from higher level assessment. ?  ?  ?General Comments  Pt feels slightly unsteady when up but states he is close to baseline with his mobility. Pt did have some facial droop and numbness on arrival but feels this has resolved. ? ?  ?Exercises   ?  ?Shoulder Instructions    ? ? ?Home Living Family/patient expects to be discharged to:: Private residence ?Living Arrangements: Children ?Available Help at Discharge: Family;Available 24 hours/day ?Type of Home: House ?Home Access: Stairs to enter ?Entrance Stairs-Number of Steps: 1 ?Entrance Stairs-Rails: None ?Home Layout: One level ?  ?  ?Bathroom Shower/Tub: Tub/shower unit ?  ?Bathroom Toilet: Standard ?  ?  ?Home Equipment: Other (comment) (getting grab rails installed in bathroom) ?  ?  ?  ? ?  ?Prior Functioning/Environment Prior Level of Function : Independent/Modified Independent ?  ?  ?  ?  ?  ?  ?Mobility Comments: Pt has not used cane/walker recently but daughter feels he could use one now. ?ADLs Comments: Pt has been fairly independent with all adls including driving.  With sz activity, pt will be restricted from driving. ?  ? ?  ?  ?OT  Problem List: Impaired balance (sitting and/or standing);Pain ?  ?   ?OT Treatment/Interventions: Self-care/ADL training;Balance training  ?  ?OT Goals(Current goals can be found in the care plan section) Acute Rehab OT Goals ?Patient Stated Goal: to go home ?OT Goal Formulation: With patient/family ?Time For Goal Achievement: 06/06/21 ?Potential to Achieve Goals: Good ?ADL Goals ?Additional ADL Goal #1: Pt will ambulate to bathroom and complete all toileting without assist. ?Additional ADL Goal #2: Pt will gather all clothing and dress self without assist. ?Additional ADL Goal #3: Pt will complete basic household tasks in room without assist.  ?OT Frequency: Min 2X/week ?  ? ?Co-evaluation   ?  ?  ?  ?  ? ?  ?AM-PAC OT "6 Clicks" Daily Activity     ?Outcome Measure Help from another person eating meals?: None ?Help from another person taking care of personal grooming?: None ?Help from another person toileting, which includes using toliet, bedpan, or urinal?: A Little ?Help from another person bathing (including washing, rinsing, drying)?: A Little ?Help from another person to put on and taking off regular upper body clothing?: None ?Help from another person to  put on and taking off regular lower body clothing?: A Little ?6 Click Score: 21 ?  ?End of Session Equipment Utilized During Treatment: Gait belt;Oxygen ?Nurse Communication: Mobility status ? ?Activity Tolerance: Patient tolerated treatment well ?Patient left: in chair;with call bell/phone within reach;with family/visitor present ? ?OT Visit Diagnosis: Unsteadiness on feet (R26.81)  ?              ?Time: 7096-4383 ?OT Time Calculation (min): 35 min ?Charges:  OT General Charges ?$OT Visit: 1 Visit ?OT Evaluation ?$OT Eval Moderate Complexity: 1 Mod ?OT Treatments ?$Self Care/Home Management : 8-22 mins ? ?Glenford Peers ?05/23/2021, 10:06 AM ?

## 2021-05-23 NOTE — Anesthesia Postprocedure Evaluation (Signed)
Anesthesia Post Note ? ?Patient: Roger Solis ? ?Procedure(s) Performed: Stereotactic needle biopsy of the brain w/brainlab ?APPLICATION OF CRANIAL NAVIGATION ? ?  ? ?Patient location during evaluation: PACU ?Anesthesia Type: General ?Level of consciousness: awake ?Pain management: pain level controlled ?Vital Signs Assessment: post-procedure vital signs reviewed and stable ?Respiratory status: spontaneous breathing, nonlabored ventilation, respiratory function stable and patient connected to nasal cannula oxygen ?Cardiovascular status: blood pressure returned to baseline and stable ?Postop Assessment: no apparent nausea or vomiting ?Anesthetic complications: no ? ? ?No notable events documented. ? ?Last Vitals:  ?Vitals:  ? 05/23/21 1900 05/23/21 2000  ?BP: (!) 165/73 (!) 179/77  ?Pulse: 69 (!) 53  ?Resp: 20 17  ?Temp:  36.9 ?C  ?SpO2: 96% 95%  ?  ?Last Pain:  ?Vitals:  ? 05/23/21 2000  ?TempSrc: Oral  ?PainSc: 0-No pain  ? ? ?  ?  ?  ?  ?  ?  ? ?[name] P [name] ? ? ? ? ?

## 2021-05-23 NOTE — Transfer of Care (Signed)
Immediate Anesthesia Transfer of Care Note ? ?Patient: Roger Solis ? ?Procedure(s) Performed: Stereotactic needle biopsy of the brain w/brainlab ?APPLICATION OF CRANIAL NAVIGATION ? ?Patient Location: PACU ? ?Anesthesia Type:General ? ?Level of Consciousness: awake and alert  ? ?Airway & Oxygen Therapy: Patient Spontanous Breathing and Patient connected to face mask oxygen ? ?Post-op Assessment: Report given to RN and Post -op Vital signs reviewed and stable ? ?Post vital signs: Reviewed and stable ? ?Last Vitals:  ?Vitals Value Taken Time  ?BP 166/81   ?Temp    ?Pulse 73   ?Resp 12   ?SpO2 100   ? ? ?Last Pain:  ?Vitals:  ? 05/23/21 0546  ?TempSrc: Oral  ?PainSc: 0-No pain  ?   ? ?Patients Stated Pain Goal: 0 (05/22/21 2001) ? ?Complications: No notable events documented. ?

## 2021-05-23 NOTE — Progress Notes (Signed)
Patient arrived to 4NICU from PACU at 1815. No urine charted since 0500 this morning. Bladder scan reads 580.I&O attempted. Unable to pass catheter. IMTS paged and Roger Solis notified. Order to insert coude catheter.  ?

## 2021-05-23 NOTE — Progress Notes (Signed)
PT Cancellation Note ? ?Patient Details ?Name: Roger Solis ?MRN: [id number] ?DOB: 1956-01-19 ? ? ?Cancelled Treatment:    Reason Eval/Treat Not Completed: (P) Patient at procedure or test/unavailable Pt is off floor for Brain biopsy. PT will follow back for Evaluation tomorrow. ? ?Ryonna Cimini B. Migdalia Dk PT, DPT ?Acute Rehabilitation Services ?Pager [phone] ?Office [phone] ? ? ? ?[place] ?05/23/2021, 3:36 PM ? ? ?

## 2021-05-23 NOTE — Progress Notes (Signed)
? ?Subjective: Overnight, patient had some chest tightness and requested nebulizer treatment; order for DuoNebs placed. No further events. ? ?Patient seen at bedside with his daughter and granddaughter.  He reports he feels well this morning, denies chest pain after the DuoNeb treatment last night, but reports need for another treatment this morning.  His symptoms continue to persist, and daughter reports he felt nauseous last night, requiring an additional as needed Zofran along with scheduled Reglan.  We discussed discontinuing Reglan and trying a different agent for hiccups, to which they were agreeable. ? ?Objective: ? ?Vital signs in last 24 hours: ?Vitals:  ? 05/22/21 2249 05/22/21 2250 05/22/21 2257 05/23/21 0546  ?BP: (!) 142/78 (!) 142/78  (!) 154/81  ?Pulse: 65 63 73 (!) 59  ?Resp: 20  19 20   ?Temp: 97.9 ?F (36.6 ?C) 97.9 ?F (36.6 ?C)  97.8 ?F (36.6 ?C)  ?TempSrc: Oral Oral  Oral  ?SpO2: 97% 97% 98% 100%  ?Weight:      ?Height:      ? ?Physical Exam: ?General: Chronically ill-appearing male in NAD with [place] beanie on head; some hiccups intermittently ?HENT: normocephalic, atraumatic, external nares and ears appear normal ?EYES: conjunctiva non-erythematous, no scleral icterus ?Cardiac: RRR, no murmurs, rubs, gallops; nonedematous lower extremities ?Pulmonary: normal work of breathing on room air; lungs CTAB ?Neurological: Awake, alert and oriented x3.  Residual left-sided facial droop at rest but largely resolved droop when smiling.  Tongue protrusion mostly midline, may be slight left deviation.  Strength 5/5 RUE, 4+/5 LUE, 4/5 RLE, and 3+/5 LLE ?Psych: normal affect and behavior  ? ?Assessment/Plan: ? ?Principal Problem: ?  Stroke-like symptoms ?Active Problems: ?  Stroke [facility]) ?  Lung cancer metastatic to brain [facility]) ?  Fever ?  Vasogenic brain edema ([place]) ?  COVID ? ?#Stroke-like symptoms ?#Lung cancer with brain metastasis  ?#Cerebral edema ?Patient with improved left sided facial droop and  left arm and leg weakness. Patient has a history of lung cancer with mets to his brain and is receiving immune therapy through the [place] (last received treatment 3/16). CT head in the ED showed worsening edema in the R>L cerebral hemispheres with new trace leftward midline shift- no acute intracranial hemorrhage was identified. Patient also had a low grade fever of 100.5 in the ED, and no longer febrile. Patient does not clinically appear septic and has no signs of infection on UA or CXR, has no s/s of meningitis, and also has no leukocytosis and lactic acid level is within normal limits. MRI brain with increase in size of one of the 2 lesions previously seen, which now shows well-defined T2 hypointense halo and increased surrounding vasogenic edema concerning for abscess. Blood (48 hrs) and urine cx negative. Repeat stereotactic CT today with extensive right> left edema, unchanged from previous imaging. ?- Per Neurosurgery, patient to have a stereotactic biopsy or drainage of the R frontal lobe lesion today. ?- NPO since midnight  ?- Continue Decadron 6 mg q6h  ?- Daily CBC, BMP ?  ?#Hiccups ?Patient with ongoing intermittent hiccups prior to admission.  ?-Discontinue Reglan due to futility; will consider adding baclofen at a later date after anesthetics have worn off ?- Continue Zofran as needed nausea ? 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 Patient has been adherent with this medication and has not had any seizure like activity. Keppra level  4.3. ?- Continue Keppra 500 mg IV bid.  Discussed with pharmacy, no need to increase Keppra at this time. ?- Seizure precautions ?  ?#CAD s/p CABG ?#PAD s/p PCI with DES ?Will hold off on resuming statin/aspirin, as we are unsure if these were recently discontinued in the setting of his chemotherapy/immune therapy treatment (these were  documented as discontinued during his last hospitalization).  ? ?Prior to Admission Living Arrangement: Home ?Anticipated Discharge Location: Pending clinical improvement and PT/OT recs ?Barriers to Discharge: Procedure ?Dispo: Anticipated discharge in approximately 5-7 day(s).  ? Rosezetta Schlatter, MD ?05/23/2021, 7:01 AM ?Pager: [phone] ?After 5pm on weekdays and 1pm on weekends: On Call pager [phone]  ?

## 2021-05-24 ENCOUNTER — Encounter (HOSPITAL_COMMUNITY): Payer: Self-pay | Admitting: Neurological Surgery

## 2021-05-24 DIAGNOSIS — R2990 Unspecified symptoms and signs involving the nervous system: Secondary | ICD-10-CM | POA: Diagnosis not present

## 2021-05-24 DIAGNOSIS — U071 COVID-19: Secondary | ICD-10-CM | POA: Diagnosis not present

## 2021-05-24 DIAGNOSIS — G936 Cerebral edema: Secondary | ICD-10-CM | POA: Diagnosis not present

## 2021-05-24 DIAGNOSIS — C3490 Malignant neoplasm of unspecified part of unspecified bronchus or lung: Secondary | ICD-10-CM | POA: Diagnosis not present

## 2021-05-24 LAB — BASIC METABOLIC PANEL
Anion gap: 6 (ref 5–15)
BUN: 20 mg/dL (ref 8–23)
CO2: 25 mmol/L (ref 22–32)
Calcium: 8.6 mg/dL — ABNORMAL LOW (ref 8.9–10.3)
Chloride: 103 mmol/L (ref 98–111)
Creatinine, Ser: 0.77 mg/dL (ref 0.61–1.24)
GFR, Estimated: >60 mL/min (ref >60)
Glucose, Bld: 113 mg/dL — ABNORMAL HIGH (ref 70–99)
Potassium: 3.9 mmol/L (ref 3.5–5.1)
Sodium: 134 mmol/L — ABNORMAL LOW (ref 135–145)

## 2021-05-24 LAB — CBC WITH DIFFERENTIAL/PLATELET
Abs Immature Granulocytes: 0.02 10*3/uL (ref 0.00–0.07)
Basophils Absolute: 0 10*3/uL (ref 0.0–0.1)
Basophils Relative: 0 %
Eosinophils Absolute: 0 10*3/uL (ref 0.0–0.5)
Eosinophils Relative: 0 %
HCT: 37.2 % — ABNORMAL LOW (ref 39.0–52.0)
Hemoglobin: 11.9 g/dL — ABNORMAL LOW (ref 13.0–17.0)
Immature Granulocytes: 0 %
Lymphocytes Relative: 13 %
Lymphs Abs: 0.6 10*3/uL — ABNORMAL LOW (ref 0.7–4.0)
MCH: 29.5 pg (ref 26.0–34.0)
MCHC: 32 g/dL (ref 30.0–36.0)
MCV: 92.3 fL (ref 80.0–100.0)
Monocytes Absolute: 0.5 10*3/uL (ref 0.1–1.0)
Monocytes Relative: 10 %
Neutro Abs: 3.7 10*3/uL (ref 1.7–7.7)
Neutrophils Relative %: 77 %
Platelets: 111 10*3/uL — ABNORMAL LOW (ref 150–400)
RBC: 4.03 MIL/uL — ABNORMAL LOW (ref 4.22–5.81)
RDW: 16 % — ABNORMAL HIGH (ref 11.5–15.5)
WBC: 4.8 10*3/uL (ref 4.0–10.5)
nRBC: 0 % (ref 0.0–0.2)

## 2021-05-24 LAB — CULTURE, BLOOD (ROUTINE X 2)
Culture: NO GROWTH
Culture: NO GROWTH
Special Requests: ADEQUATE
Special Requests: ADEQUATE

## 2021-05-24 MED ORDER — POLYETHYLENE GLYCOL 3350 17 G PO PACK
17.0000 g | PACK | Freq: Every day | ORAL | Status: DC
  Administered 2021-05-24 – 2021-05-25 (×2): 17 g via ORAL
  Filled 2021-05-24 (×2): qty 1

## 2021-05-24 MED ORDER — PANTOPRAZOLE SODIUM 40 MG PO TBEC
40.0000 mg | DELAYED_RELEASE_TABLET | Freq: Every day | ORAL | Status: DC
  Administered 2021-05-25: 40 mg via ORAL
  Filled 2021-05-24: qty 1

## 2021-05-24 MED ORDER — SENNA 8.6 MG PO TABS
2.0000 | ORAL_TABLET | Freq: Every day | ORAL | Status: DC
Start: 2021-05-24 — End: 2021-05-25

## 2021-05-24 MED ORDER — TAMSULOSIN HCL 0.4 MG PO CAPS
0.4000 mg | ORAL_CAPSULE | Freq: Every day | ORAL | Status: DC
  Administered 2021-05-24: 0.4 mg via ORAL
  Filled 2021-05-24: qty 1

## 2021-05-24 NOTE — Evaluation (Signed)
Physical Therapy Evaluation ?Patient Details ?Name: Roger Solis ?MRN: [id number] ?DOB: 03/12/1955 ?Today's Date: 05/24/2021 ? ?History of Present Illness ? 65 y.o. male presents to [facility] hospital on 05/19/2020 with L weakness and facial droop. Pt has a history of brain tumor, undergoing chemo and radiation. MRI reveals Right frontal contiguous enhancing lesions as well as 9 mm left frontal lesion with edema. Pt underwent brain biopsy on 05/23/2021. PMH includes HTN, HLD, CAD s/p CABG, PAD s/p PCI w/ stents, primary lung cancer with brain metastasis.  ?Clinical Impression ? Pt presents to PT with deficits in balance and with L facial droop. Pt with loss of balance with dynamic gait challenges and will benefit from further gait and balance training during this admission to reduce falls risk. Pt expressing interest in utilizing a RW to improve stability at the time of discharge. PT also notes L facial droop, with difficulty maintaining oral secretions at times during session. PT will continue to follow this admission.   ?   ? ?Recommendations for follow up therapy are one component of a multi-disciplinary discharge planning process, led by the attending physician.  Recommendations may be updated based on patient status, additional functional criteria and insurance authorization. ? ?Follow Up Recommendations No PT follow up ? ?  ?Assistance Recommended at Discharge PRN  ?Patient can return home with the following ? A little help with walking and/or transfers;Help with stairs or ramp for entrance;Assistance with cooking/housework ? ?  ?Equipment Recommendations Rolling walker (2 wheels)  ?Recommendations for Other Services ?    ?  ?Functional Status Assessment Patient has had a recent decline in their functional status and demonstrates the ability to make significant improvements in function in a reasonable and predictable amount of time.  ? ?  ?Precautions / Restrictions Precautions ?Precautions: Other (comment) ?Precaution  Comments: seizure/covid + ?Restrictions ?Weight Bearing Restrictions: No  ? ?  ? ?Mobility ? Bed Mobility ?Overal bed mobility: Independent ?  ?  ?  ?  ?  ?  ?  ?  ? ?Transfers ?Overall transfer level: Needs assistance ?  ?Transfers: Sit to/from Stand ?Sit to Stand: Supervision ?  ?  ?  ?  ?  ?  ?  ? ?Ambulation/Gait ?Ambulation/Gait assistance: Supervision ?Gait Distance (Feet): 150 Feet ?Assistive device: None ?Gait Pattern/deviations: Step-through pattern ?Gait velocity: functional ?Gait velocity interpretation: >2.62 ft/sec, indicative of community ambulatory ?  ?General Gait Details: one lateral loss of balance with head turn when distracted, Pt corrects with stepping strategy. Otherwise pt tolerates backward walking, quick changes of direction and abrupt stoppages of gait ? ?Stairs ?  ?  ?  ?  ?  ? ?Wheelchair Mobility ?  ? ?Modified Rankin (Stroke Patients Only) ?  ? ?  ? ?Balance Overall balance assessment: Needs assistance ?Sitting-balance support: No upper extremity supported, Feet supported ?Sitting balance-Leahy Scale: Good ?  ?  ?Standing balance support: No upper extremity supported, During functional activity ?Standing balance-Leahy Scale: Good ?  ?Single Leg Stance - Right Leg: 2 ?Single Leg Stance - Left Leg: 1 ?  ?  ?Rhomberg - Eyes Opened: 30 ?Rhomberg - Eyes Closed: 30 ?  ?  ?  ?  ?  ?   ? ? ? ?Pertinent Vitals/Pain Pain Assessment ?Pain Assessment: No/denies pain  ? ? ?Home Living Family/patient expects to be discharged to:: Private residence ?Living Arrangements: Children ?Available Help at Discharge: Family;Available 24 hours/day ?Type of Home: House ?Home Access: Stairs to enter ?Entrance Stairs-Rails: None ?Entrance Stairs-Number of Steps:  1 ?  ?Home Layout: One level ?Home Equipment: None ?   ?  ?Prior Function Prior Level of Function : Independent/Modified Independent ?  ?  ?  ?  ?  ?  ?Mobility Comments: Pt has not used cane/walker recently but daughter feels he could use one now. ?ADLs  Comments: Pt has been fairly independent with all adls including driving.  With sz activity, pt will be restricted from driving. ?  ? ? ?Hand Dominance  ? Dominant Hand: Right ? ?  ?Extremity/Trunk Assessment  ? Upper Extremity Assessment ?Upper Extremity Assessment: Overall WFL for tasks assessed ?  ? ?Lower Extremity Assessment ?Lower Extremity Assessment: Overall WFL for tasks assessed ?  ? ?Cervical / Trunk Assessment ?Cervical / Trunk Assessment: Normal  ?Communication  ? Communication: Other (comment) (mild dysarthria)  ?Cognition Arousal/Alertness: Awake/alert ?Behavior During Therapy: Flat affect ?Overall Cognitive Status: Within Functional Limits for tasks assessed ?  ?  ?  ?  ?  ?  ?  ?  ?  ?  ?  ?  ?  ?  ?  ?  ?  ?  ?  ? ?  ?General Comments General comments (skin integrity, edema, etc.): VSS on RA, hypertension to 164/66 ? ?  ?Exercises    ? ?Assessment/Plan  ?  ?PT Assessment Patient needs continued PT services  ?PT Problem List Decreased balance ? ?   ?  ?PT Treatment Interventions DME instruction;Gait training;Stair training;Functional mobility training;Therapeutic activities;Therapeutic exercise;Balance training;Neuromuscular re-education;Patient/family education   ? ?PT Goals (Current goals can be found in the Care Plan section)  ?Acute Rehab PT Goals ?Patient Stated Goal: to return to independence in mobility ?PT Goal Formulation: With patient ?Time For Goal Achievement: 06/07/21 ?Potential to Achieve Goals: Fair ?Additional Goals ?Additional Goal #1: Pt will score >19/24 on the DGI to indicate a reduced risk for falls ? ?  ?Frequency Min 3X/week ?  ? ? ?Co-evaluation   ?  ?  ?  ?  ? ? ?  ?AM-PAC PT "6 Clicks" Mobility  ?Outcome Measure Help needed turning from your back to your side while in a flat bed without using bedrails?: None ?Help needed moving from lying on your back to sitting on the side of a flat bed without using bedrails?: None ?Help needed moving to and from a bed to a chair  (including a wheelchair)?: A Little ?Help needed standing up from a chair using your arms (e.g., wheelchair or bedside chair)?: A Little ?Help needed to walk in hospital room?: A Little ?Help needed climbing 3-5 steps with a railing? : A Little ?6 Click Score: 20 ? ?  ?End of Session   ?Activity Tolerance: Patient tolerated treatment well ?Patient left: in chair;with call bell/phone within reach (RN applying chair alarm at end of session) ?Nurse Communication: Mobility status ?PT Visit Diagnosis: Other symptoms and signs involving the nervous system (R29.898);Other abnormalities of gait and mobility (R26.89) ?  ? ?Time: 1325-1340 ?PT Time Calculation (min) (ACUTE ONLY): 15 min ? ? ?Charges:   PT Evaluation ?$PT Eval Low Complexity: 1 Low ?  ?  ?   ? ? ?Zenaida Niece, PT, DPT ?Acute Rehabilitation ?Pager: [phone] ?Office [phone] ? ? ?Zenaida Niece ?05/24/2021, 1:54 PM ? ?

## 2021-05-24 NOTE — Progress Notes (Addendum)
? ?Subjective: Patient with 580cc urinary retention and difficulty with straight cath, so coude catheter inserted.  ? ?Reports he feels better this morning, having breakfast. ? ?Still feeling a little short of breath, thinks it is from COPD. ? ?States that hiccups are improved. ? ?Daughter states that she thinks he looks better today. ? ? ? ?Objective: ? ?Vital signs in last 24 hours: ?Vitals:  ? 05/24/21 0300 05/24/21 0400 05/24/21 0500 05/24/21 0600  ?BP: 116/63 125/62 (!) 109/58 124/68  ?Pulse: (!) 53 (!) 56 (!) 54 (!) 57  ?Resp: '17 17 16 16  ' ?Temp:  98.2 ?F (36.8 ?C)    ?TempSrc:  Oral    ?SpO2: 95% 94% 95% 95%  ?Weight:      ?Height:      ? ?Physical Exam: ?General: Chronically ill-appearing male in NAD with sitting up in recliner chair eating breakfast. No hiccups during assessment. ?HENT: normocephalic, external nares and ears appear normal; bandage on R frontotemporal area c/d/i ?EYES: conjunctiva non-erythematous, no scleral icterus ?Cardiac: RRR, no murmurs, rubs, gallops; nonedematous lower extremities ?Pulmonary: normal work of breathing on room air; lungs CTAB ?Neurological: Awake, alert and oriented x3.  Increase in dysarthria today and some expressive aphasia with repetition of "No ifs, ands, or buts about it." Residual left-sided facial droop at rest and when smiling.  Tongue protrusion mostly midline. Strength 5/5 RUE, 5/5 LUE ?Psych: normal affect and behavior  ? ?Assessment/Plan: ? ?Principal Problem: ?  Stroke-like symptoms ?Active Problems: ?  Stroke [facility]) ?  Lung cancer metastatic to brain [facility]) ?  Fever ?  Vasogenic brain edema ([place]) ?  COVID ?  Acute left-sided weakness ? ?#Stroke-like symptoms ?#Lung cancer with brain metastasis  ?#Cerebral edema ?Patient with recurrence of left sided facial droop but resolved weakness of left extremities. Patient has a history of lung cancer with mets to his brain and is receiving immune therapy through the [place] (last received treatment 3/16). CT head in the  ED showed worsening edema in the R>L cerebral hemispheres with new trace leftward midline shift- no acute intracranial hemorrhage was identified. Patient also had a low grade fever of 100.5 in the ED, and no longer febrile. Patient does not clinically appear septic and has no signs of infection on UA or CXR, has no s/s of meningitis, and also has no leukocytosis and lactic acid level is within normal limits. MRI brain with increase in size of one of the 2 lesions previously seen, which now shows well-defined T2 hypointense halo and increased surrounding vasogenic edema concerning for abscess. Blood (48 hrs) and urine cx negative. Repeat stereotactic CT 3/21 with extensive right> left edema, unchanged from previous imaging. Stereotactic biopsy of the R frontal lobe lesion also 3/21. ?- Per neurosurgery, patient to transfer to floor today; pathology and culture pending. ?- Continue decreased dose of Decadron 4 mg q8h  ?- Daily CBC, BMP ?  ?#Hiccups ?Patient with ongoing intermittent hiccups prior to admission. Improvement since procedure. ?-Discontinue Reglan due to futility; will consider adding baclofen at a later date after anesthetics have worn off if no improvement of hiccups. ?- Continue Zofran as needed nausea ? ?#Constipation ?Patient without BM this admission. ?-Scheduled Miralax daily and Sennakot 17.2 mg qHS ? ?#Urinary retention ?Patient with 580 cc retained on bladder scan post-procedure. Attempted straight cath, met with resistance, so coude catheter inserted.  ?- Initiate Flomax 0.4 mg qHS ?- PT/OT to evaluate ?- Will reassess tomorrow. If patient voiding appropriately, will remove coude catheter. ? 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 Patient has been adherent with this medication and has not had any seizure like activity. Keppra level 4.3. ?- Continue Keppra  500 mg IV bid.  Discussed with pharmacy, no need to increase Keppra at this time. ?- Seizure precautions ?  ?#CAD s/p CABG ?#PAD s/p PCI with DES ?Will hold off on resuming statin/aspirin, as we are unsure if these were recently discontinued in the setting of his chemotherapy/immune therapy treatment (these were documented as discontinued during his last hospitalization).  ? ?Prior to Admission Living Arrangement: Home ?Anticipated Discharge Location: Pending clinical improvement and PT/OT recs ?Barriers to Discharge: Procedure ?Dispo: Anticipated discharge in approximately 1-2 day(s) pending clinical improvement.  ? Rosezetta Schlatter, MD ?05/24/2021, 6:28 AM ?Pager: [phone] ?After 5pm on weekdays and 1pm on weekends: On Call pager [phone]  ?

## 2021-05-24 NOTE — Progress Notes (Signed)
Patient ID: Roger Solis, male   DOB: 1955-10-01, 66 y.o.   MRN: [id number] ?Vital signs are stable ?Patient appears to be doing quite well with good motor function minimal drift mild left facial weakness ?No worsening after biopsy ?Pathology has been sent and cultures are pending ?We will transfer the patient to the floor ?If he remains stable may discharge him home tomorrow for further follow-up as an outpatient ?Overall he seems improved ?

## 2021-05-25 ENCOUNTER — Telehealth: Payer: Self-pay | Admitting: Internal Medicine

## 2021-05-25 DIAGNOSIS — R2990 Unspecified symptoms and signs involving the nervous system: Secondary | ICD-10-CM | POA: Diagnosis not present

## 2021-05-25 DIAGNOSIS — U071 COVID-19: Secondary | ICD-10-CM | POA: Diagnosis not present

## 2021-05-25 DIAGNOSIS — R509 Fever, unspecified: Secondary | ICD-10-CM | POA: Diagnosis not present

## 2021-05-25 MED ORDER — DOCUSATE SODIUM 100 MG PO CAPS: 100.0000 mg | ORAL_CAPSULE | Freq: Two times a day (BID) | ORAL | 0 refills | Status: DC

## 2021-05-25 MED ORDER — PANTOPRAZOLE SODIUM 40 MG PO TBEC: 40.0000 mg | DELAYED_RELEASE_TABLET | Freq: Every day | ORAL | 0 refills | Status: DC

## 2021-05-25 MED ORDER — ONDANSETRON 4 MG PO TBDP: 4.0000 mg | ORAL_TABLET | Freq: Three times a day (TID) | ORAL | 0 refills | Status: DC | PRN

## 2021-05-25 MED ORDER — DEXAMETHASONE 4 MG PO TABS: 4.0000 mg | ORAL_TABLET | Freq: Two times a day (BID) | ORAL | 0 refills | Status: DC

## 2021-05-25 MED ORDER — SENNA 8.6 MG PO TABS
2.0000 | ORAL_TABLET | Freq: Every day | ORAL | 0 refills | Status: DC
Start: 2021-05-25 — End: 2021-06-15

## 2021-05-25 MED ORDER — POLYETHYLENE GLYCOL 3350 17 G PO PACK: 17.0000 g | PACK | Freq: Every day | ORAL | 0 refills | Status: DC

## 2021-05-25 MED ORDER — NICOTINE 21 MG/24HR TD PT24: 21.0000 mg | MEDICATED_PATCH | Freq: Every day | TRANSDERMAL | 0 refills | Status: DC

## 2021-05-25 NOTE — TOC Transition Note (Signed)
Transition of Care (TOC) - CM/SW Discharge Note ? ? ?Patient Details  ?Name: Roger Solis ?MRN: [id number] ?Date of Birth: Mar 13, 1955 ? ?Transition of Care [facility]) CM/SW Contact:  ?Ella Bodo, RN ?Phone Number: ?05/25/2021, 12:19 PM ? ? ?Clinical Narrative:    ?66 y.o. male presents to [facility] hospital on 05/19/2020 with L weakness and facial droop. Pt has a history of brain tumor, undergoing chemo and radiation. MRI reveals Right frontal contiguous enhancing lesions as well as 9 mm left frontal lesion with edema. Pt underwent brain biopsy on 05/23/2021.  ?PTA, pt independent and living at home with daughter.  PT/OT recommending no OP follow up, RW for home. Pt has [employer] benefits; RW will take at least a week to obtain a RW for patient, as [place] will ship.  Pt/dtr prefer to purchase RW at retail store.  ? ? ?Final next level of care: Home/Self Care ?Barriers to Discharge: Barriers Resolved ? ? ?Patient Goals and CMS Choice ?Patient states their goals for this hospitalization and ongoing recovery are:: to go home ?  ?  ? ?           ?  ?  ?  ?  ? ?Discharge Plan and Services ?  ?Discharge Planning Services: CM Consult ?           ?  ?  ?  ?  ?  ?  ?  ?  ?  ?  ? ?Social Determinants of Health (SDOH) Interventions ?  ? ? ?Readmission Risk Interventions ?   ? View : No data to display.  ?  ?  ?  ? ?Reinaldo Raddle, RN, BSN  ?Trauma/Neuro ICU Case Manager ?[phone] ? ? ? ? ?

## 2021-05-25 NOTE — Discharge Summary (Signed)
? ?Name: Roger Solis ?MRN: [id number] ?DOB: Feb 13, 1956 66 y.o. ?PCP: [facility] ? ?Date of Admission: 05/19/2021  2:45 PM ?Date of Discharge:   05/25/21 9:30 AM ?Attending Physician: Lottie Mussel, MD ? ?Discharge Diagnosis: ?Principal Problem: ?  Stroke-like symptoms ?Active Problems: ?  Stroke [facility]) ?  Lung cancer metastatic to brain [facility]) ?  Fever ?  Vasogenic brain edema ([place]) ?  COVID ?  Acute left-sided weakness ? ?Discharge Medications: ?Allergies as of 05/25/2021   ?No Known Allergies ?  ? ?  ?Medication List  ?  ? ?TAKE these medications   ? ?albuterol 108 (90 Base) MCG/ACT inhaler ?Commonly known as: VENTOLIN HFA ?Inhale 1 puff into the lungs every 6 (six) hours as needed for shortness of breath. ?  ?dexamethasone 4 MG tablet ?Commonly known as: DECADRON ?Take 1 tablet (4 mg total) by mouth 2 (two) times daily. ?  ?docusate sodium 100 MG capsule ?Commonly known as: COLACE ?Take 1 capsule (100 mg total) by mouth 2 (two) times daily. ?  ?levETIRAcetam 500 MG tablet ?Commonly known as: KEPPRA ?Take 1 tablet (500 mg total) by mouth 2 (two) times daily. ?  ?nicotine 21 mg/24hr patch ?Commonly known as: NICODERM CQ - dosed in mg/24 hours ?Place 1 patch (21 mg total) onto the skin daily. ?Start taking on: May 26, 2021 ?  ?ondansetron 4 MG disintegrating tablet ?Commonly known as: ZOFRAN-ODT ?Take 1 tablet (4 mg total) by mouth every 8 (eight) hours as needed for nausea or vomiting. ?  ?pantoprazole 40 MG tablet ?Commonly known as: PROTONIX ?Take 1 tablet (40 mg total) by mouth daily. ?Start taking on: May 26, 2021 ?  ?polyethylene glycol 17 g packet ?Commonly known as: MIRALAX / GLYCOLAX ?Take 17 g by mouth daily. ?Start taking on: May 26, 2021 ?  ?senna 8.6 MG Tabs tablet ?Commonly known as: SENOKOT ?Take 2 tablets (17.2 mg total) by mouth at bedtime. ?  ? ?  ? ?  ?  ? ? ?  ?Durable Medical Equipment  ?(From admission, onward)  ?  ? ? ?  ? ?  Start     Ordered  ? 05/24/21 1607  For home use  only DME Walker rolling  Once       ?Question Answer Comment  ?Walker: With 5 Inch Wheels   ?Patient needs a walker to treat with the following condition Neoplasm of brain causing mass effect on adjacent structures [facility])   ?Patient needs a walker to treat with the following condition Initial physical therapy assessment   ?  ? 05/24/21 1607  ? ?  ?  ? ?  ? ? ?Disposition and follow-up:   ?Mr.Erick Murin was discharged from [facility] in Stable condition.  At the hospital follow up visit please address: ? ?1.  Brain tumor pathology, hiccups ? ?2.  Labs / imaging needed at time of follow-up: N/A ? ?3.  Pending labs/ test needing follow-up: Surgical pathology ? ?Follow-up Appointments: ? ?Neurosurgery with Dr. Ellene Route to be scheduled ? ?Hospital Course by problem list: ? ?#Stroke-like symptoms ?#Lung cancer with brain metastasis  ?#Cerebral edema ?Patient with mildly persistent left sided facial droop but resolved weakness of left extremities. Patient has a history of lung cancer with mets to his brain and is receiving immune therapy through the [place] (last received treatment 3/16). CT head in the ED showed worsening edema in the R>L cerebral hemispheres with new trace leftward midline shift- no acute intracranial hemorrhage was identified. Patient  also had a low grade fever of 100.5 in the ED but remained afebrile throughout admission. Patient did not clinically appear septic and had no signs of infection on UA or CXR, had no s/s of meningitis, and also had no leukocytosis and lactic acid level was within normal limits. MRI brain with increase in size of one of the 2 lesions previously seen, which now shows well-defined T2 hypointense halo and increased surrounding vasogenic edema concerning for abscess. Blood (48 hrs) and urine cx negative. Repeat stereotactic CT 3/21 with extensive right> left edema, unchanged from previous imaging. Stereotactic biopsy of the R frontal lobe lesion also 3/21. Upon  discharge, patient to continue decadron 4 mg BID. ?  ?#Hiccups ?Patient with ongoing intermittent hiccups prior to admission. Improvement since procedure. Upon discharge, continue Zofran as needed for nausea. Advised to discuss starting Baclofen with PCP. ?  ?#Constipation ?Patient typically has one time/ week BM and was without BM this admission. Scheduled Miralax daily and Sennakot 17.2 mg qHS, to be continued upon discharge.  ?  ?#Urinary retention, resolved ?Patient with 580 cc retained on bladder scan post-procedure. Attempted straight cath, met with resistance, so coude catheter inserted. Flomax 0.4 mg was given x 1 night. Patient voiding without issues, so catheter removed and Flomax discontinued prior to discharge.  ?  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 Patient has been adherent with this medication and has not had any seizure like activity. Keppra level 4.3. Discussed with pharmacy, no need to increase Keppra at this time, so home dose Keppra 500 mg BID continued IV. PO dosage to resume upon discharge. ?  ?#CAD s/p CABG ?#PAD s/p PCI with DES ?Held off on resuming statin/aspirin, as we were unsure if these were recently discontinued in the setting of his chemotherapy/immune therapy treatment (these were documented as discontinued during his last hospitalization). ? ?Discharge Subjective: ?Feels good this morning. He is ready to go home today.  ? ?Discharge Exam:   ?BP 125/69   Pulse 75   Temp 98.6 ?F (37 ?C) (Oral)   Resp 20   Ht '6\' 1"'  (1.854 m)   Wt 78.3 kg   SpO2 94%   BMI 22.77 kg/m?  ?General: Chronically ill-appearing male in NAD with sitting up in recliner chair eating breakfast. No hiccups during assessment. ?HENT: normocephalic, external nares and ears appear normal; hat covering bandage on R frontotemporal area ?EYES: conjunctiva non-erythematous,  no scleral icterus ?Cardiac: RRR, no murmurs, rubs, gallops; nonedematous lower extremities ?Pulmonary: normal work of breathing on room air; lungs CTAB ?Neurological: Awake, alert and oriented x3.  Improvement in dysarthria today and some expressive aphasia with repetition of "No ifs, ands, or buts about it." Residual left-sided facial droop at rest and when smiling, but improved. Strength 5/5 BUE, 4/5 BLE ?Psych: normal affect and behavior  ? ?Pertinent Labs, Studies, and Procedures:  ? ?CT BRAINLAB HEAD W/O CONTRAST (1MM) ? ?Result Date: 05/23/2021 ?CLINICAL DATA:  Surgical planning. EXAM: CT HEAD WITHOUT CONTRAST TECHNIQUE: Contiguous axial images were obtained from the base of the skull through the vertex without intravenous contrast. RADIATION DOSE REDUCTION: This exam was performed according to the departmental dose-optimization program which includes automated exposure control, adjustment of the mA and/or kV according to patient size and/or use of iterative reconstruction technique. COMPARISON:  CT May 19, 2021. FINDINGS: Brain: Redemonstrated changes of right pterional craniotomy  with subjacent small volume of recent hemorrhage. Similar extensive adjacent vasogenic edema and mass effect, better characterized on recent MRI. Similar extension of edema into the white matter tracts at the level basal ganglia and some milder edema on the left. Similar small, subcentimeter focus of hyperdensity within the cortex in this region which may represent calcification or hemorrhage. No evidence of new/interval acute large vascular territory infarct or hydrocephalus. Similar effacement the right lateral ventricle. Vascular: No hyperdense vessel identified. Skull: Right pterional craniotomy.  No acute fracture. Sinuses/Orbits: Paranasal sinus mucosal thickening and right maxillary sinus fluid. No acute orbital findings. Other: No mastoid effusions. IMPRESSION: No substantial change in extensive right greater than left  cerebral edema with similar mass effect. Similar small volume of hemorrhage. Findings better characterized on recent MRI. Electronically Signed   By: Margaretha Sheffield M.D.   On: 05/23/2021 09:14  ?  ? ?  Latest R

## 2021-05-25 NOTE — Progress Notes (Signed)
OT Cancellation Note ? ?Patient Details ?Name: Roger Solis ?MRN: [id number] ?DOB: 1956-03-03 ? ? ?Cancelled Treatment:    Reason Eval/Treat Not Completed: Patient declined, discharging this date, no needs expressed.   ? ?Tiarna Koppen D Chaley Castellanos ?05/25/2021, 12:06 PM ?

## 2021-05-25 NOTE — Discharge Instructions (Addendum)
Dear Roger Solis, ?I am so glad you are feeling better and can be discharged today (05/25/2021)! You were admitted for stroke-like symptoms, which was due to swelling around one of your tumors. You saw neurosurgery and had a biopsy of this tumor. We are currently awaiting results of the biopsy. ? ?Please see the following instructions: ?Please attend the scheduled appointment or make an appointment to follow-up with the specialists seen below. ?Neurosurgery, Dr. Kristeen Miss ?Please see your primary care / family doctor for your other medical issues, concerns and or health care needs. ?NEW meds:  ?Decadron 4 mg twice daily, protonix 40 mg daily, Nicotine patch 21 mg daily, and Zofran 4 mg disintegrating tablet every 8 hours as needed for nausea ?Over the counter: Colace 100 mg twice daily, Miralax daily, and Senokot 17.2 mg (2 tablets) at bedtime. ?CONTINUE home meds: ?Keppra 500 mg twice daily and Albuterol  ? ?Report any adverse effects and or reactions from the medicines to your outpatient provider promptly. Do not engage in alcohol and or illegal drug use while on prescription medicines. In the event of worsening symptoms call 911 and/or go to the nearest ED for appropriate evaluation and treatment of symptoms. ? ?It was a pleasure meeting you, Roger Solis.  I wish you and your family the best, and hope you stay happy and healthy! ? ?Rosezetta Schlatter, MD ?05/25/2021   ?

## 2021-05-25 NOTE — Telephone Encounter (Signed)
Cancelled per 3/23 pt request, pt does not want to r/s at this time ?

## 2021-05-26 ENCOUNTER — Encounter: Payer: Self-pay | Admitting: Neurology

## 2021-05-26 LAB — SURGICAL PATHOLOGY

## 2021-05-29 ENCOUNTER — Telehealth: Payer: Self-pay | Admitting: Radiation Oncology

## 2021-05-29 ENCOUNTER — Inpatient Hospital Stay: Payer: No Typology Code available for payment source | Attending: Radiation Oncology

## 2021-05-29 LAB — AEROBIC/ANAEROBIC CULTURE W GRAM STAIN (SURGICAL/DEEP WOUND): Culture: NO GROWTH

## 2021-05-29 NOTE — Telephone Encounter (Signed)
-----   Message from Pincus Large sent at 05/29/2021  2:12 PM EDT ----- ?Pt cancelled his appt with Dr. Mickeal Skinner and did not want to reschedule...... Has a visit with Delice Lesch too  ? ?

## 2021-05-29 NOTE — Telephone Encounter (Signed)
I called and spoke with the patient's daughter to let them know about the biopsy results not showing cancer, but post inflammatory changes anticipated from his prior cyberknife procedure. I encouraged them to proceed a visit with Dr. Mickeal Skinner but the patient apparently cancelled his appt. His daughter Roger Solis will check with the [facility] for community care approval. He's welcome to see Dr. Delice Lesch, but we feel it would be best for Dr. Mickeal Skinner to also participate in his care for brain oncology follow up as it relates to management of his neurologic symptoms. I've asked our special procedures navigator to reach back out tomorrow to confirm community care clinic authorization so we can get him to see Dr. Mickeal Skinner.  ?

## 2021-05-30 ENCOUNTER — Ambulatory Visit: Payer: No Typology Code available for payment source | Admitting: Internal Medicine

## 2021-06-01 ENCOUNTER — Telehealth: Payer: Self-pay | Admitting: Radiation Oncology

## 2021-06-01 NOTE — Telephone Encounter (Signed)
I spoke with Dr. Sarita Haver, oncology fellow at the [facility] to make sure he was aware of Roger Solis recent hospitalization and discharge on dexamethasone 4 mg BID on 05/25/21.  ?

## 2021-06-05 ENCOUNTER — Ambulatory Visit: Payer: No Typology Code available for payment source | Admitting: Neurology

## 2021-06-14 ENCOUNTER — Telehealth: Payer: Self-pay | Admitting: Internal Medicine

## 2021-06-14 NOTE — Telephone Encounter (Signed)
Called daughter to confirm appt tomorrow. ?

## 2021-06-15 ENCOUNTER — Inpatient Hospital Stay: Payer: No Typology Code available for payment source | Attending: Radiation Oncology | Admitting: Internal Medicine

## 2021-06-15 ENCOUNTER — Other Ambulatory Visit: Payer: Self-pay

## 2021-06-15 VITALS — BP 125/72 | HR 91 | Temp 97.7°F | Resp 19 | Ht 73.0 in | Wt 171.8 lb

## 2021-06-15 DIAGNOSIS — I6789 Other cerebrovascular disease: Secondary | ICD-10-CM | POA: Insufficient documentation

## 2021-06-15 DIAGNOSIS — Z923 Personal history of irradiation: Secondary | ICD-10-CM | POA: Insufficient documentation

## 2021-06-15 DIAGNOSIS — R609 Edema, unspecified: Secondary | ICD-10-CM | POA: Insufficient documentation

## 2021-06-15 DIAGNOSIS — I10 Essential (primary) hypertension: Secondary | ICD-10-CM | POA: Insufficient documentation

## 2021-06-15 DIAGNOSIS — F17210 Nicotine dependence, cigarettes, uncomplicated: Secondary | ICD-10-CM | POA: Diagnosis not present

## 2021-06-15 DIAGNOSIS — Z79899 Other long term (current) drug therapy: Secondary | ICD-10-CM | POA: Diagnosis not present

## 2021-06-15 DIAGNOSIS — C7931 Secondary malignant neoplasm of brain: Secondary | ICD-10-CM | POA: Diagnosis not present

## 2021-06-15 DIAGNOSIS — Y842 Radiological procedure and radiotherapy as the cause of abnormal reaction of the patient, or of later complication, without mention of misadventure at the time of the procedure: Secondary | ICD-10-CM | POA: Insufficient documentation

## 2021-06-15 DIAGNOSIS — I739 Peripheral vascular disease, unspecified: Secondary | ICD-10-CM | POA: Diagnosis not present

## 2021-06-15 DIAGNOSIS — C3490 Malignant neoplasm of unspecified part of unspecified bronchus or lung: Secondary | ICD-10-CM | POA: Insufficient documentation

## 2021-06-15 DIAGNOSIS — I2510 Atherosclerotic heart disease of native coronary artery without angina pectoris: Secondary | ICD-10-CM | POA: Diagnosis not present

## 2021-06-15 DIAGNOSIS — E785 Hyperlipidemia, unspecified: Secondary | ICD-10-CM | POA: Insufficient documentation

## 2021-06-15 MED ORDER — DEXAMETHASONE 1 MG PO TABS: ORAL_TABLET | ORAL | 0 refills | Status: AC

## 2021-06-15 NOTE — Progress Notes (Signed)
? ?[facility] [address]  ?[place], [place] [address] ?[phone] ? ? ?New Patient Evaluation ? ?Date of Service: 06/15/21 ?Patient Name: Roger Solis ?Patient MRN: [id number] ?Patient DOB: December 09, 1955 ?Provider: Ventura Sellers, MD ? ?Identifying Statement:  ?Roger Solis is a 66 y.o. male with Lung cancer metastatic to brain [facility]) - Plan: MR [place] ? ?Radiation therapy induced brain necrosis who presents for initial consultation and evaluation regarding cancer associated neurologic deficits.   ? ?Referring Provider: ?[facility],  [place] [address] ? ?Primary Cancer: NSCLC (previously treated in [place], [place]) ? ?Oncologic History ?03/17/20: R frontal craniotomy, debulking in [place]  ?04/17/20: SRS to residual tumor ?05/23/21: Stereotactic biopsy for localized progression, path is necrosis ([facility]) ? ?History of Present Illness: ?The patient's records from the referring physician were obtained and reviewed and the patient interviewed to confirm this HPI.  Roger Solis presents today for evaluation after hospitalization this past month for brain metastasis, inflammation.  He initially was diagnosed and treated last year in [place]), having undergone both surgical and radiosurgical interventions for right frontal lesion.  He subsequently moved to [place] in early 2023.  On 05/05/21, he presented to the ED with focal seizures c/w left sided shaking.  He was started on Keppra, MRI brain demonstrated right frontoparietal met without prior available for comparison.  Two weeks later he presented again, this time with left sided weakness.  MRI brain at that time demonstrated progressive changes consistent with tumor progression or inflammation.  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  Since surgery he has been dosing decadron 57m BID.  He feels essentially at his  baseline currently, not complaining of left sided weakness anymore.    ? ?Medications: ?Current Outpatient Medications on File Prior to Visit  ?Medication Sig Dispense Refill  ? albuterol (VENTOLIN HFA) 108 (90 Base) MCG/ACT inhaler Inhale 1 puff into the lungs every 6 (six) hours as needed for shortness of breath.    ? levETIRAcetam (KEPPRA) 500 MG tablet Take 1 tablet (500 mg total) by mouth 2 (two) times daily. 60 tablet 1  ? nicotine (NICODERM CQ - DOSED IN MG/24 HOURS) 21 mg/24hr patch Place 1 patch (21 mg total) onto the skin daily. 28 patch 0  ? ondansetron (ZOFRAN-ODT) 4 MG disintegrating tablet Take 1 tablet (4 mg total) by mouth every 8 (eight) hours as needed for nausea or vomiting. 20 tablet 0  ? ?No current facility-administered medications on file prior to visit.  ? ? ?Allergies: No Known Allergies ?Past Medical History:  ?Past Medical History:  ?Diagnosis Date  ? CAD (coronary artery disease)   ? History of brain cancer   ? HLD (hyperlipidemia)   ? Hypertension   ? PAD (peripheral artery disease) ([place]   ? ?Past Surgical History:  ?Past Surgical History:  ?Procedure Laterality Date  ? APPLICATION OF CRANIAL NAVIGATION N/A 05/23/2021  ? Procedure: APPLICATION OF CRANIAL NAVIGATION;  Surgeon: EKristeen Miss MD;  [facility] [facility]  Service: Neurosurgery;  Laterality: N/A;  ? FRAMELESS  BIOPSY WITH BRAINLAB N/A 05/23/2021  ? Procedure: Stereotactic needle biopsy of the brain w/brainlab;  Surgeon: EKristeen Miss MD;  [place] [facility]  Service: Neurosurgery;  Laterality: N/A;  ? ?Social History:  ?Social History  ? ?Socioeconomic History  ? Marital status: Single  ?  Spouse name: Not on file  ? Number of children: Not on file  ?  Years of education: Not on file  ? Highest education level: Not on file  ?Occupational History  ? Not on file  ?Tobacco Use  ? Smoking status: Every Day  ?  Packs/day: 1.00  ?  Types: Cigarettes  ? Smokeless tobacco: Never  ?Substance and Sexual Activity  ? Alcohol use: Yes  ?   Comment: six pack a week  ? Drug use: Never  ? Sexual activity: Not on file  ?Other Topics Concern  ? Not on file  ?Social History Narrative  ? Not on file  ? ?Social Determinants of Health  ? ?Financial Resource Strain: Not on file  ?Food Insecurity: Not on file  ?Transportation Needs: Not on file  ?Physical Activity: Not on file  ?Stress: Not on file  ?Social Connections: Not on file  ?Intimate Partner Violence: Not on file  ? ?Family History:  ?Family History  ?Problem Relation Age of Onset  ? Heart disease Mother   ? Heart disease Father   ? ? ?Review of Systems: ?Constitutional: Doesn't report fevers, chills or abnormal weight loss ?Eyes: Doesn't report blurriness of vision ?Ears, nose, mouth, throat, and face: Doesn't report sore throat ?Respiratory: Doesn't report cough, dyspnea or wheezes ?Cardiovascular: Doesn't report palpitation, chest discomfort  ?Gastrointestinal:  Doesn't report nausea, constipation, diarrhea ?GU: Doesn't report incontinence ?Skin: Doesn't report skin rashes ?Neurological: Per HPI ?Musculoskeletal: Doesn't report joint pain ?Behavioral/Psych: Doesn't report anxiety ? ? ?Physical Exam: ?Vitals:  ? 06/15/21 0843  ?BP: 125/72  ?Pulse: 91  ?Resp: 19  ?Temp: 97.7 ?F (36.5 ?C)  ?SpO2: 99%  ? ?KPS: 70. ?General: Alert, cooperative, pleasant, in no acute distress ?Head: Normal ?EENT: No conjunctival injection or scleral icterus.  ?Lungs: Resp effort normal ?Cardiac: Regular rate ?Abdomen: Non-distended abdomen ?Skin: No rashes cyanosis or petechiae. ?Extremities: No clubbing or edema ? ?Neurologic Exam: ?Mental Status: Awake, alert, attentive to examiner. Oriented to self and environment. Language is fluent with intact comprehension.  Age advanced psychomotor slowing. ?Cranial Nerves: Visual acuity is grossly normal. Visual fields are full. Extra-ocular movements intact. No ptosis. Face is symmetric ?Motor: Tone and bulk are normal. Power is full in both arms and legs. Reflexes are symmetric,  no pathologic reflexes present.  ?Sensory: Intact to light touch ?Gait: Mild dystaxia ? ? ?Labs: ?I have reviewed the data as listed ?   ?Component Value Date/Time  ? NA 134 (L) 05/24/2021 0714  ? K 3.9 05/24/2021 0714  ? CL 103 05/24/2021 0714  ? CO2 25 05/24/2021 0714  ? GLUCOSE 113 (H) 05/24/2021 0714  ? BUN 20 05/24/2021 0714  ? CREATININE 0.77 05/24/2021 0714  ? CALCIUM 8.6 (L) 05/24/2021 0714  ? PROT 7.0 05/19/2021 1505  ? ALBUMIN 3.7 05/19/2021 1505  ? AST 53 (H) 05/19/2021 1505  ? ALT 35 05/19/2021 1505  ? ALKPHOS 85 05/19/2021 1505  ? BILITOT 0.7 05/19/2021 1505  ? GFRNONAA >60 05/24/2021 0714  ? ?Lab Results  ?Component Value Date  ? WBC 4.8 05/24/2021  ? NEUTROABS 3.7 05/24/2021  ? HGB 11.9 (L) 05/24/2021  ? HCT 37.2 (L) 05/24/2021  ? MCV 92.3 05/24/2021  ? PLT 111 (L) 05/24/2021  ? ? ?Imaging: ? ?CT HEAD WO CONTRAST ? ?Result Date: 05/19/2021 ?CLINICAL DATA:  Neuro deficit, acute, stroke suspected. Facial droop. Left-sided weakness. History of brain tumor. EXAM: CT HEAD WITHOUT CONTRAST TECHNIQUE: Contiguous axial images were obtained from the base of the skull through the vertex without intravenous contrast. RADIATION DOSE REDUCTION: This exam was performed according  to the departmental dose-optimization program which includes automated exposure control, adjustment of the mA and/or kV according to patient size and/or use of iterative reconstruction technique. COMPARISON:  Head CT and MRI 05/05/2021 FINDINGS: Brain: Sequelae of right pterional craniotomy are again identified with unchanged underlying dural thickening and small volume extra-axial fluid there is a large region of vasogenic edema centered in the posterior right frontal lobe which has progressed from the prior studies, extends into the deep white matter tracts at the level of the basal ganglia, and results in partial effacement of the right lateral ventricle with trace leftward midline shift. A subcentimeter hyperdense focus within this  region of edema is unchanged and may reflect calcification or hemorrhage. A small region of edema in the left frontal lobe has mildly increased as well. No new intracranial hemorrhage or acute cortically based inf

## 2021-06-16 ENCOUNTER — Telehealth: Payer: Self-pay | Admitting: Internal Medicine

## 2021-06-16 NOTE — Telephone Encounter (Signed)
Scheduled per 4/13 los, message has been left with pt ?

## 2021-06-17 ENCOUNTER — Encounter (HOSPITAL_COMMUNITY): Payer: Self-pay | Admitting: Emergency Medicine

## 2021-06-17 ENCOUNTER — Emergency Department (HOSPITAL_COMMUNITY)
Admission: EM | Admit: 2021-06-17 | Discharge: 2021-06-17 | Disposition: A | Discharge status: Home / Self Care | Payer: No Typology Code available for payment source | Attending: Emergency Medicine | Admitting: Emergency Medicine

## 2021-06-17 ENCOUNTER — Emergency Department (HOSPITAL_COMMUNITY): Payer: No Typology Code available for payment source

## 2021-06-17 ENCOUNTER — Other Ambulatory Visit: Payer: Self-pay

## 2021-06-17 DIAGNOSIS — R739 Hyperglycemia, unspecified: Secondary | ICD-10-CM | POA: Diagnosis not present

## 2021-06-17 DIAGNOSIS — R202 Paresthesia of skin: Secondary | ICD-10-CM | POA: Insufficient documentation

## 2021-06-17 DIAGNOSIS — R200 Anesthesia of skin: Secondary | ICD-10-CM | POA: Diagnosis present

## 2021-06-17 DIAGNOSIS — F17200 Nicotine dependence, unspecified, uncomplicated: Secondary | ICD-10-CM | POA: Insufficient documentation

## 2021-06-17 DIAGNOSIS — E871 Hypo-osmolality and hyponatremia: Secondary | ICD-10-CM | POA: Diagnosis not present

## 2021-06-17 DIAGNOSIS — I998 Other disorder of circulatory system: Secondary | ICD-10-CM | POA: Diagnosis not present

## 2021-06-17 DIAGNOSIS — M79672 Pain in left foot: Secondary | ICD-10-CM | POA: Insufficient documentation

## 2021-06-17 DIAGNOSIS — M79605 Pain in left leg: Secondary | ICD-10-CM

## 2021-06-17 LAB — COMPREHENSIVE METABOLIC PANEL
ALT: 31 U/L (ref 0–44)
AST: 26 U/L (ref 15–41)
Albumin: 2.9 g/dL — ABNORMAL LOW (ref 3.5–5.0)
Alkaline Phosphatase: 64 U/L (ref 38–126)
Anion gap: 10 (ref 5–15)
BUN: 18 mg/dL (ref 8–23)
CO2: 22 mmol/L (ref 22–32)
Calcium: 8.1 mg/dL — ABNORMAL LOW (ref 8.9–10.3)
Chloride: 102 mmol/L (ref 98–111)
Creatinine, Ser: 0.83 mg/dL (ref 0.61–1.24)
GFR, Estimated: >60 mL/min (ref >60)
Glucose, Bld: 146 mg/dL — ABNORMAL HIGH (ref 70–99)
Potassium: 4.1 mmol/L (ref 3.5–5.1)
Sodium: 134 mmol/L — ABNORMAL LOW (ref 135–145)
Total Bilirubin: 0.5 mg/dL (ref 0.3–1.2)
Total Protein: 5.9 g/dL — ABNORMAL LOW (ref 6.5–8.1)

## 2021-06-17 LAB — I-STAT CHEM 8, ED
BUN: 20 mg/dL (ref 8–23)
Calcium, Ion: 1.04 mmol/L — ABNORMAL LOW (ref 1.15–1.40)
Chloride: 102 mmol/L (ref 98–111)
Creatinine, Ser: 0.6 mg/dL — ABNORMAL LOW (ref 0.61–1.24)
Glucose, Bld: 139 mg/dL — ABNORMAL HIGH (ref 70–99)
HCT: 41 % (ref 39.0–52.0)
Hemoglobin: 13.9 g/dL (ref 13.0–17.0)
Potassium: 4 mmol/L (ref 3.5–5.1)
Sodium: 135 mmol/L (ref 135–145)
TCO2: 25 mmol/L (ref 22–32)

## 2021-06-17 LAB — DIFFERENTIAL
Abs Immature Granulocytes: 0.43 10*3/uL — ABNORMAL HIGH (ref 0.00–0.07)
Basophils Absolute: 0 10*3/uL (ref 0.0–0.1)
Basophils Relative: 0 %
Eosinophils Absolute: 0 10*3/uL (ref 0.0–0.5)
Eosinophils Relative: 0 %
Immature Granulocytes: 4 %
Lymphocytes Relative: 6 %
Lymphs Abs: 0.6 10*3/uL — ABNORMAL LOW (ref 0.7–4.0)
Monocytes Absolute: 0.3 10*3/uL (ref 0.1–1.0)
Monocytes Relative: 3 %
Neutro Abs: 8.6 10*3/uL — ABNORMAL HIGH (ref 1.7–7.7)
Neutrophils Relative %: 87 %

## 2021-06-17 LAB — PROTIME-INR
INR: 0.9 (ref 0.8–1.2)
Prothrombin Time: 12.3 seconds (ref 11.4–15.2)

## 2021-06-17 LAB — CBC
HCT: 40.4 % (ref 39.0–52.0)
Hemoglobin: 13.1 g/dL (ref 13.0–17.0)
MCH: 30.7 pg (ref 26.0–34.0)
MCHC: 32.4 g/dL (ref 30.0–36.0)
MCV: 94.6 fL (ref 80.0–100.0)
Platelets: 151 10*3/uL (ref 150–400)
RBC: 4.27 MIL/uL (ref 4.22–5.81)
RDW: 16.3 % — ABNORMAL HIGH (ref 11.5–15.5)
WBC: 10 10*3/uL (ref 4.0–10.5)
nRBC: 0 % (ref 0.0–0.2)

## 2021-06-17 LAB — APTT: aPTT: 25 seconds (ref 24–36)

## 2021-06-17 IMAGING — CT CT HEAD W/O CM
4 series · 16 of 47 positions shown, 18 images · non-contrast
Comparison: CT head dated [DATE]. MRI brain dated [DATE].

CLINICAL DATA: Headache and bilateral leg numbness. History of lung
cancer with brain metastasis.



[Series 2: head wo · axial · 0.39mm/px · z∈[-586,-456]mm · 7 of 36 slices shown, 9 images]
[im 5/36  brain]
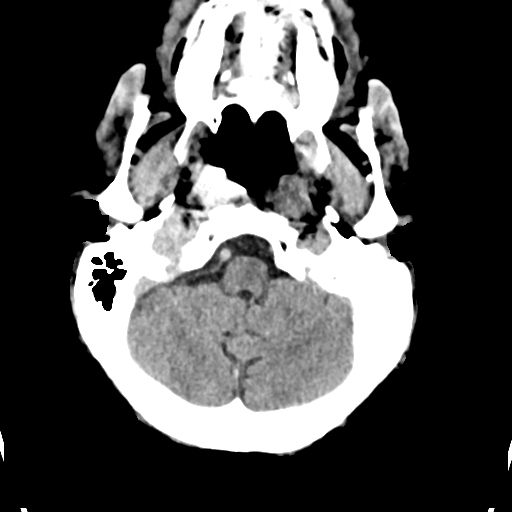
[im 5/36  bone]
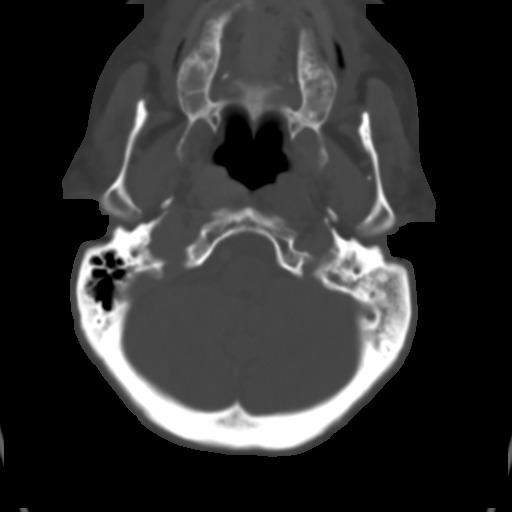
[im 9/36  brain]
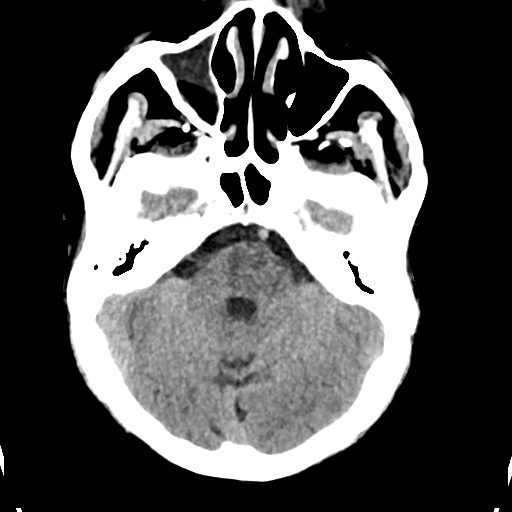
[im 14/36  brain]
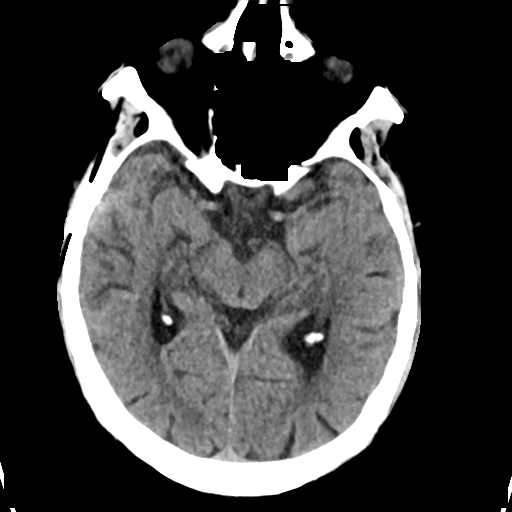
[im 18/36  brain]
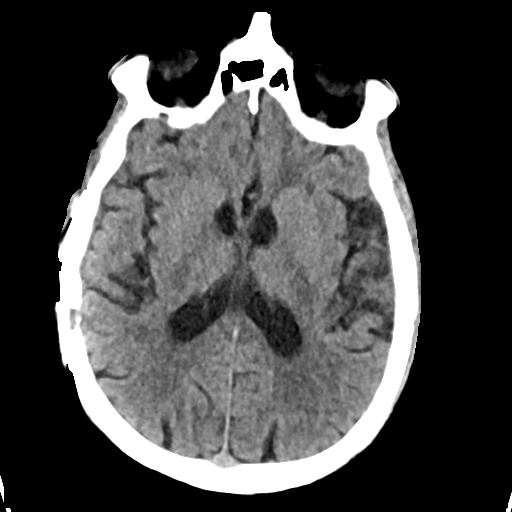
[im 22/36  brain]
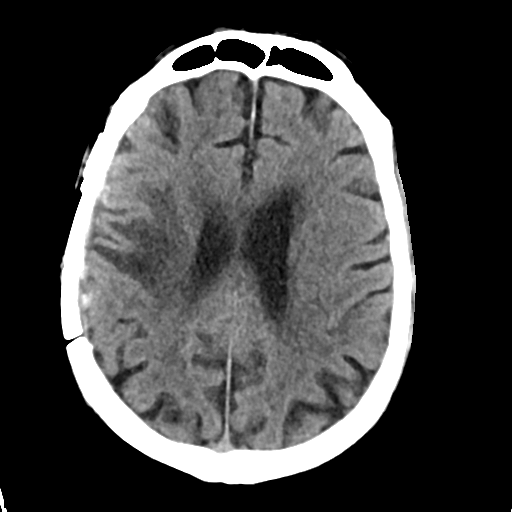
[im 22/36  bone]
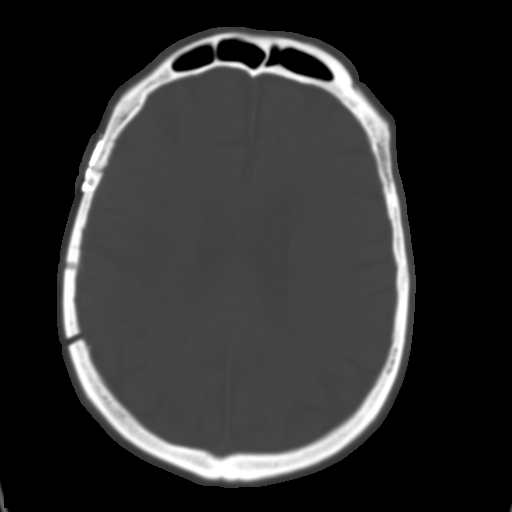
[im 27/36  brain]
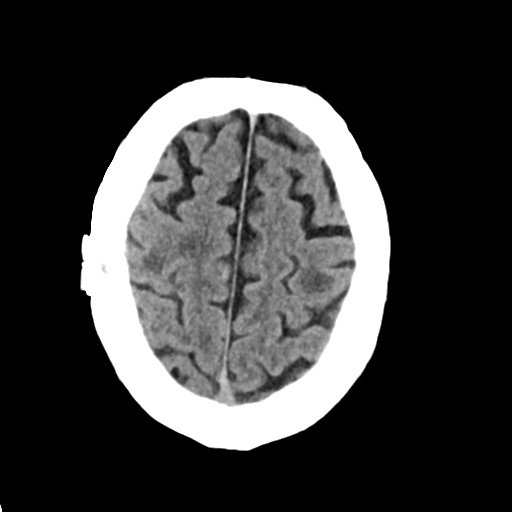
[im 31/36  brain]
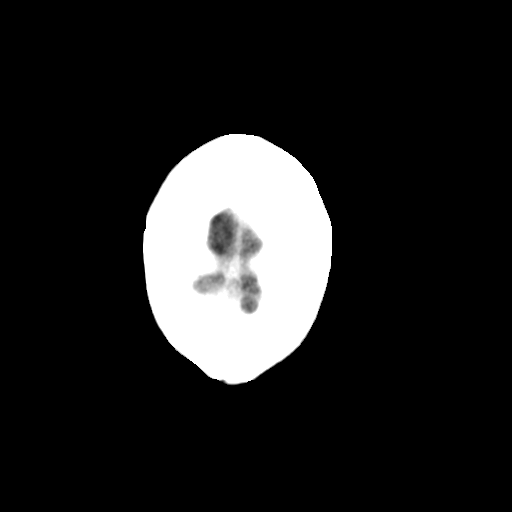

[Series 3: head bone · axial · 0.39mm/px · z∈[-590,-554]mm · 3 of 88 slices shown]
[im 9/88  bone]
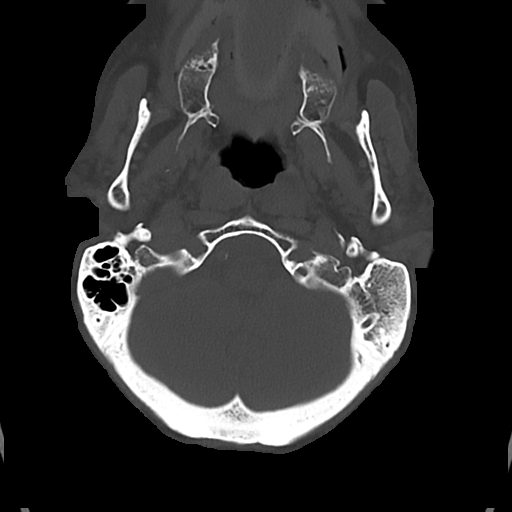
[im 18/88  bone]
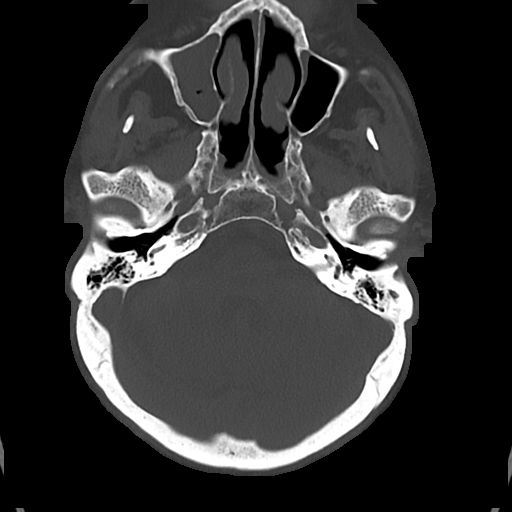
[im 27/88  bone]
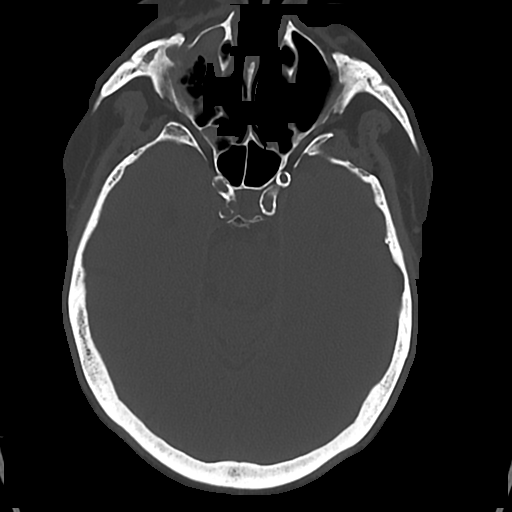

[Series 4: cor soft · coronal · 0.36mm/px · 3 of 71 slices shown]
[im 24/71  brain]
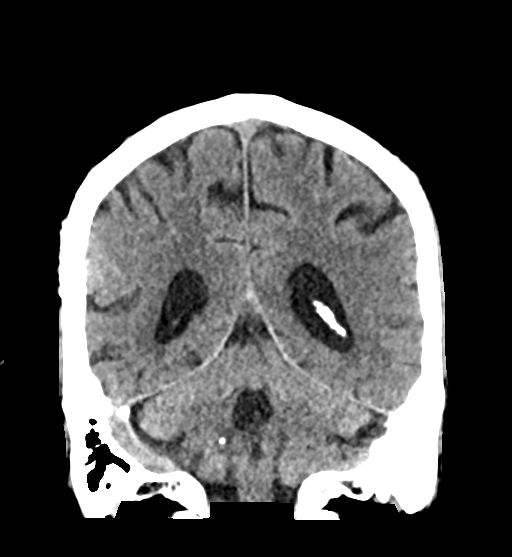
[im 32/71  brain]
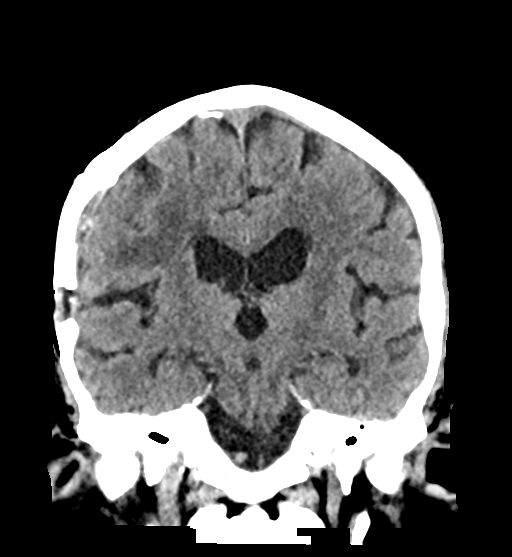
[im 39/71  brain]
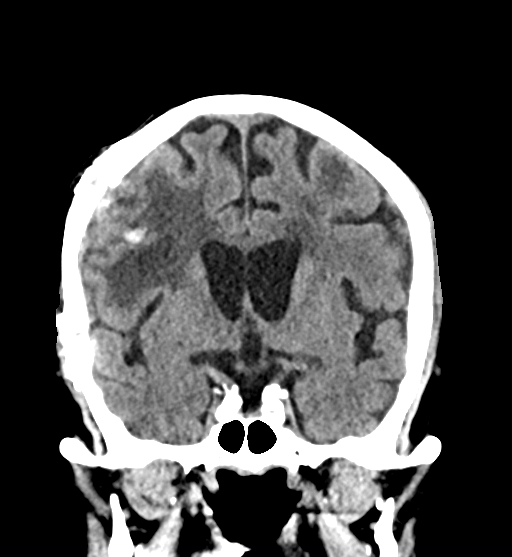

[Series 5: sag soft · sagittal · 0.39mm/px · 3 of 62 slices shown]
[im 21/62  brain]
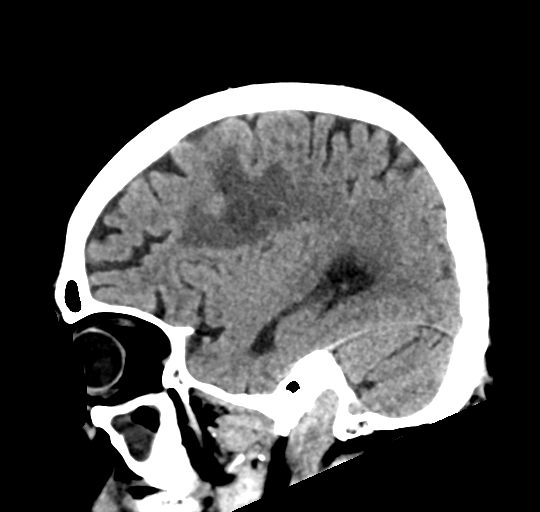
[im 31/62  brain]
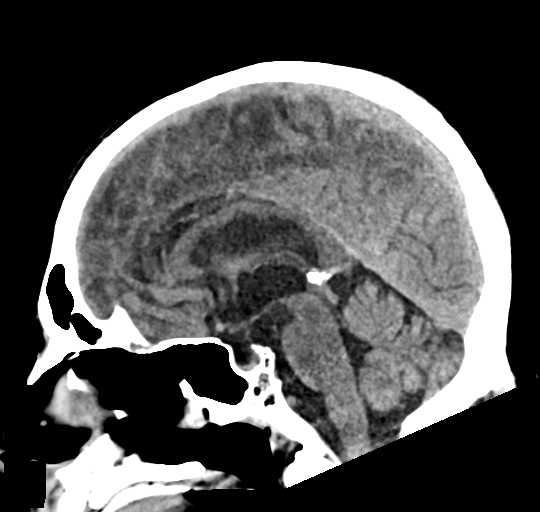
[im 41/62  brain]
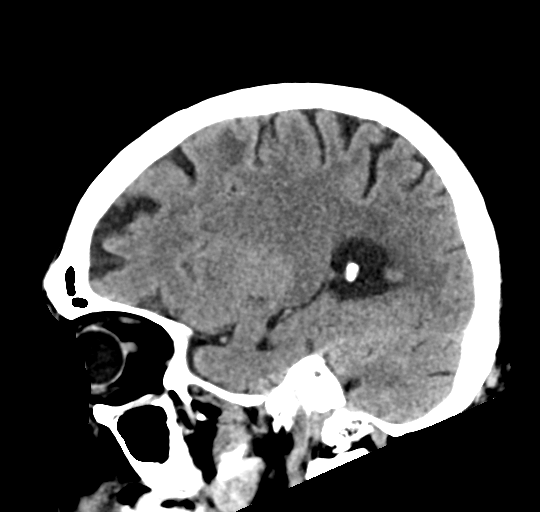

[16 of 47 positions shown; findings below may reference images not displayed]

FINDINGS: Brain: Significantly improved vasogenic edema in the right greater
than left posterior frontal lobes compared to prior study. Unchanged
7 mm focus of hyperdensity centered within the right frontal
vasogenic edema, which could represent calcification or hemorrhage.
No evidence of acute infarction, new hemorrhage, hydrocephalus, or
extra-axial collection. Unchanged chronic dural thickening overlying
the right cerebral hemisphere at the site of prior craniotomy.
Stable mild atrophy.

Vascular: Calcified atherosclerosis at the skull base. No hyperdense
vessel.

Skull: Prior right pterional craniotomy again noted. No fracture or
focal lesion.

Sinuses/Orbits: No acute finding. Chronic bilateral ethmoid air cell
and right greater than left maxillary sinus opacification.

Other: None.
IMPRESSION: 1. Significantly improved vasogenic edema in the right greater than
left posterior frontal lobes compared to prior study. Unchanged 7 mm
focus of hyperdensity centered within the right frontal vasogenic
edema, which could represent calcification or hemorrhage.
2.  No new acute intracranial abnormality.

## 2021-06-17 IMAGING — CT CT ANGIO AOBIFEM WO/W CM
1 of 7 series · 4 of 16 positions shown, 5 images · IV contrast (APPLIED)
Comparison: None.

CLINICAL DATA: Bilateral leg numbness, headache and left hip pain
since earlier today. History of lung carcinoma with brain metastatic
disease.

EXAM:
CT ANGIOGRAPHY OF ABDOMINAL AORTA WITH ILIOFEMORAL RUNOFF
TECHNIQUE: Multidetector CT imaging of the abdomen, pelvis and lower
extremities was performed using the standard protocol during bolus
administration of intravenous contrast. Multiplanar CT image
reconstructions and MIPs were obtained to evaluate the vascular
anatomy.

[Series 5: arterial · axial · arterial · 0.98mm/px · z∈[+683,+1637]mm · 4 of 797 slices shown, 5 images]
[im 160/797  soft-tissue]
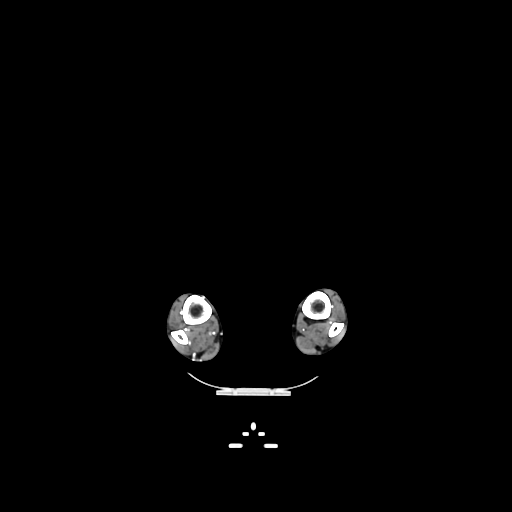
[im 160/797  bone]
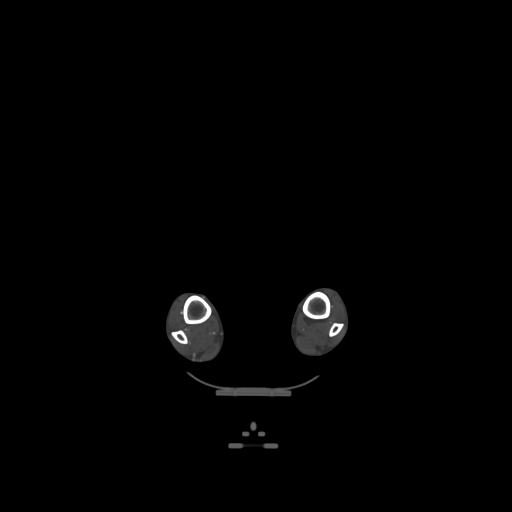
[im 319/797  soft-tissue]
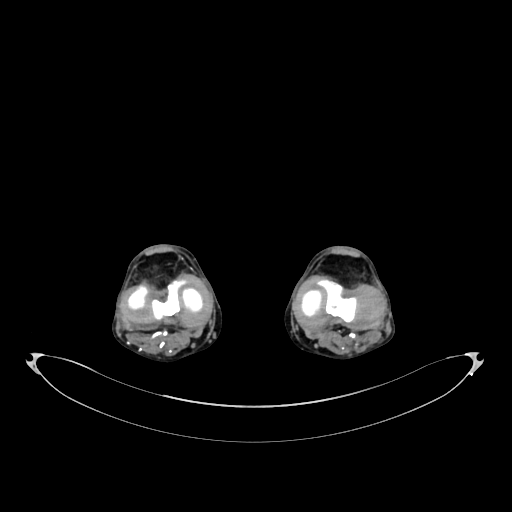
[im 478/797  soft-tissue]
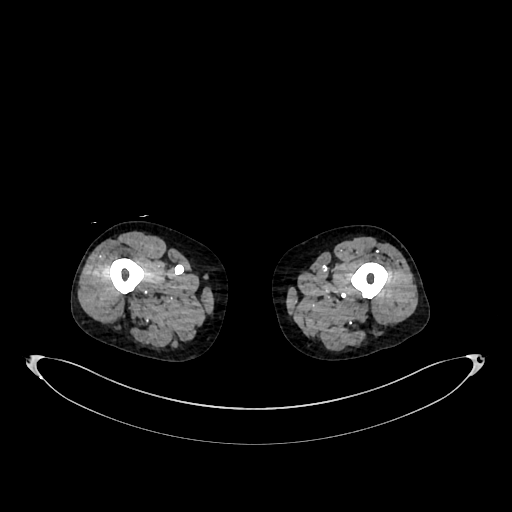
[im 637/797  soft-tissue]
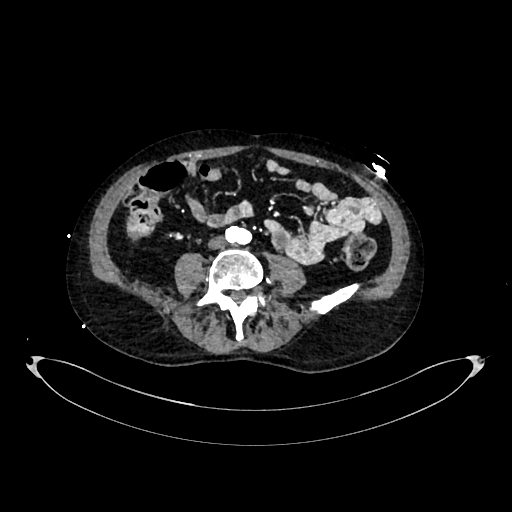

[4 of 16 positions shown; findings below may reference images not displayed]

RADIATION DOSE REDUCTION: This exam was performed according to the
departmental dose-optimization program which includes automated
exposure control, adjustment of the mA and/or kV according to
patient size and/or use of iterative reconstruction technique.

CONTRAST:  125mL OMNIPAQUE IOHEXOL 350 MG/ML SOLN
FINDINGS: VASCULAR

Aorta: Extensive atherosclerosis. Mural thrombus along the
infrarenal aorta narrows the vessel by 50%. No aneurysm. No
dissection.

Celiac: Severe narrowing at the origin of the celiac axis, at least
90%, narrowing spanning 1.1 cm.

SMA: Short focus of hemodynamically significant atherosclerotic
narrowing 2 cm beyond the origin, narrowing an estimated 70%.

Renals: Calcified atherosclerotic plaque at the origin the right
renal artery narrowed by an estimated 50%. Calcified atherosclerotic
plaque at the origin of the left renal artery, with severe stenosis,
at least 70%.

IMA: Atherosclerotic plaque at the origin of the inferior mesenteric
artery, which is patent.

RIGHT Lower Extremity

Inflow: Atherosclerotic plaque extends from the common iliac artery
through the external iliac artery.

Outflow: There is a 60% narrowing at the junction of the external
iliac artery common femoral artery with significant narrowing of the
common femoral artery, 60-70%, and severe narrowing at the origin of
the deep femoral artery, at least 70%.

Runoff: Right femoral artery demonstrates a stent extending to the
abductor canal. Vessel is patent throughout the length of the stent.
There is irregular atherosclerotic narrowing of the distal femoral
artery below the stent, narrowing to at least 70%. Popliteal artery
shows multiple areas of significant hemodynamically significant
narrowing, over 90% along its most distal portion. Contrast
enhancement is seen in the trifurcation vessels extending into the
distal leg, posterior tibial and dorsalis pedis remaining patent
into the foot.

LEFT Lower Extremity

Inflow: Occluded left common iliac artery, external iliac artery and
internal iliac artery. The external iliac artery is reconstituted
distally from collaterals.

Outflow: Atherosclerotic plaque along the common femoral artery,
narrowed up to 50%.

Runoff: Left femoral artery is occluded. Vessel is reconstituted
distally from collaterals, shows multiple areas of hemodynamically
significant atherosclerotic narrowing. There is significant
atherosclerosis along the popliteal artery with multiple areas of
hemodynamically significant narrowing, greater than 70%.
Trifurcation vessels show contrast enhancement. The posterior tibial
artery is occluded in its proximal portion. There is faint
enhancement in the anterior tibial artery, providing week flow to
the dorsalis pedis.

Veins: Suboptimally assessed, being unenhanced on this arterial
exam. No gross abnormality.

Review of the MIP images confirms the above findings.

NON-VASCULAR

Lower chest: There are opacities at both lung bases dependently,
left greater than right, consistent with atelectasis. Consider
infection if there are consistent clinical findings.

Hepatobiliary: No focal liver abnormality is seen. No gallstones,
gallbladder wall thickening, or biliary dilatation.

Pancreas: Unremarkable. No pancreatic ductal dilatation or
surrounding inflammatory changes.

Spleen: Normal in size without focal abnormality.

Adrenals/Urinary Tract: No adrenal mass. Atrophy of the left kidney
small low-density mass, anterior left kidney consistent with a cyst.
No hydronephrosis. Normal ureters. Unremarkable bladder.

Stomach/Bowel: Unremarkable stomach. Small bowel and colon are
normal in caliber. No wall thickening. No inflammation.

Lymphatic: No enlarged lymph nodes.

Reproductive: Unremarkable.

Other: None.

Musculoskeletal: No fracture or acute finding. No osteoblastic or
osteolytic lesions.
IMPRESSION: VASCULAR

1. Multiple areas of hemodynamically significant vascular disease.
2. Significant atherosclerosis along the aorta narrowing the
infrarenal abdominal aorta, where there is associated thrombus, by
50%. No aneurysm.
3. Severe to critical stenosis of the celiac axis. Approximate 50%
narrowing of the right renal artery. Severe narrowing of the left
renal artery.
4. Occluded left common iliac artery with occlusion of most of the
left external iliac artery. Distal external iliac artery and left
common femoral artery reconstituted from collaterals.
5. Hemodynamically significant stenosis of the distal right external
iliac artery and common femoral artery, to 60%.
6. Widely patent right femoral artery stent. Hemodynamically
significant narrowing of the popliteal artery, with a distal lesion
at least 90%. Right trifurcation vessels remain patent.
7. Occluded left femoral artery, reconstituted distally.
Hemodynamically significant narrowing of the left popliteal artery.
Trifurcation vessels are patent proximally, posterior tibial artery
becoming occluded, only weak flow seen extending into the foot from
the anterior tibial artery.

NON-VASCULAR

1. Left greater than right dependent lung base opacities, likely
atelectasis. Consider pneumonia if there are consistent clinical
findings.
2. No acute findings within the abdomen or pelvis.

## 2021-06-17 MED ORDER — SODIUM CHLORIDE 0.9% FLUSH: 3.0000 mL | Freq: Once | INTRAVENOUS | Status: DC

## 2021-06-17 MED ORDER — IOHEXOL 350 MG/ML SOLN
125.0000 mL | Freq: Once | INTRAVENOUS | Status: AC | PRN
  Administered 2021-06-17: 125 mL via INTRAVENOUS

## 2021-06-17 MED ORDER — DEXAMETHASONE 4 MG PO TABS
2.0000 mg | ORAL_TABLET | Freq: Once | ORAL | Status: AC
  Administered 2021-06-17: 2 mg via ORAL
  Filled 2021-06-17: qty 1

## 2021-06-17 NOTE — ED Provider Notes (Signed)
?[facility] ?Provider Note ? ? ?CSN: [id number] ?Arrival date & time: 06/17/21  1325 ? ?  ? ?History ? ?Chief Complaint  ?Patient presents with  ? Numbness  ? ? ?Roger Solis is a 66 y.o. male. ? 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  Daughter reports that he was decreased from 8 mg to 4 mg of Decadron on Thursday.  Today he began complaining of numbness through the left arm and left leg with increased pain of the left foot.  Patient has had prior revascularizations of the right lower extremity and has increased pain, coolness, and pallor of the left foot.  He is a smoker and continues to smoke.  Patient was initially diagnosed and treated in [place] and has undergone surgical and radiosurgical interventions to the right frontal lobe.  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 ?  ? ?Home Medications ?Prior to Admission medications   ?Medication Sig Start Date End Date Taking? Authorizing Provider  ?albuterol (VENTOLIN HFA) 108 (90 Base) MCG/ACT inhaler Inhale 1 puff into the lungs every 6 (six) hours as needed for shortness of breath. 10/18/20   [provider]  ?dexamethasone (DECADRON) 1 MG tablet Take 3 tablets (3 mg total) by mouth daily with breakfast for 7 days, THEN 2 tablets (2 mg total) daily with breakfast for 7 days, THEN 1 tablet (1 mg total) daily with breakfast for 7 days. 06/15/21 07/06/21  Ventura Sellers, MD  ?levETIRAcetam (KEPPRA) 500 MG tablet Take 1 tablet (500 mg total) by mouth 2 (two) times daily. 05/06/21   Virl Axe, MD  ?nicotine (NICODERM CQ - DOSED IN MG/24 HOURS) 21 mg/24hr patch Place 1 patch (21 mg total) onto the skin daily. 05/26/21   Rosezetta Schlatter, MD  ?ondansetron (ZOFRAN-ODT) 4 MG disintegrating tablet Take 1 tablet (4 mg total) by mouth every 8 (eight)  hours as needed for nausea or vomiting. 05/25/21   Rosezetta Schlatter, MD  ?   ? ?Allergies    ?Patient has no known allergies.   ? ?Review of Systems   ?Review of Systems  ?All other systems reviewed and are negative. ? ?Physical Exam ?Updated Vital Signs ?BP (!) 148/83   Pulse 78   Temp 98.3 ?F (36.8 ?C) (Oral)   Resp 17   SpO2 94%  ?Physical Exam ?Vitals and nursing note reviewed.  ?Constitutional:   ?   General: He is not in acute distress. ?   Appearance: Normal appearance. He is ill-appearing.  ?HENT:  ?   Head: Normocephalic.  ?   Comments: Well-healing right temporal prior craniotomy ?Well-healing right wound consistent with recent needle biopsy ?   Right Ear: External ear normal.  ?   Left Ear: External ear normal.  ?   Nose: Nose normal.  ?   Mouth/Throat:  ?   Mouth: Mucous membranes are dry.  ?   Pharynx: Oropharynx is clear.  ?Eyes:  ?   Extraocular Movements: Extraocular movements intact.  ?   Pupils: Pupils are equal, round, and reactive to light.  ?Cardiovascular:  ?   Rate and Rhythm: Normal rate and regular rhythm.  ?   Pulses: Normal pulses.  ?Pulmonary:  ?   Effort: Pulmonary effort is normal.  ?Abdominal:  ?   General: Bowel sounds are normal.  ?   Palpations:  Abdomen is soft.  ?Musculoskeletal:     ?   General: Tenderness present. Normal range of motion.  ?   Cervical back: Normal range of motion.  ?   Comments: Pallor left foot  ?Skin: ?   General: Skin is warm and dry.  ?   Capillary Refill: Capillary refill takes less than 2 seconds.  ?Neurological:  ?   Mental Status: He is alert.  ?   Cranial Nerves: Cranial nerve deficit present.  ?   Comments: Left leg with some decreased strength ?Decreased sensation from left knee down  ?Psychiatric:     ?   Mood and Affect: Mood normal.  ? ? ?ED Results / Procedures / Treatments   ?Labs ?(all labs ordered are listed, but only abnormal results are displayed) ?Labs Reviewed  ?CBC - Abnormal; Notable for the following components:  ?    Result Value  ?  RDW 16.3 (*)   ? All other components within normal limits  ?DIFFERENTIAL - Abnormal; Notable for the following components:  ? Neutro Abs 8.6 (*)   ? Lymphs Abs 0.6 (*)   ? Abs Immature Granulocytes 0.43 (*)   ? All other components within normal limits  ?COMPREHENSIVE METABOLIC PANEL - Abnormal; Notable for the following components:  ? Sodium 134 (*)   ? Glucose, Bld 146 (*)   ? Calcium 8.1 (*)   ? Total Protein 5.9 (*)   ? Albumin 2.9 (*)   ? All other components within normal limits  ?I-STAT CHEM 8, ED - Abnormal; Notable for the following components:  ? Creatinine, Ser 0.60 (*)   ? Glucose, Bld 139 (*)   ? Calcium, Ion 1.04 (*)   ? All other components within normal limits  ?PROTIME-INR  ?APTT  ? ? ?EKG ?None ?EKG reviewed interpreted and interpretation is in MUSE-unchanged from prior ? ?Radiology ?CT HEAD WO CONTRAST ? ?Result Date: 06/17/2021 ?CLINICAL DATA:  Headache and bilateral leg numbness. History of lung cancer with brain metastasis. EXAM: CT HEAD WITHOUT CONTRAST TECHNIQUE: Contiguous axial images were obtained from the base of the skull through the vertex without intravenous contrast. RADIATION DOSE REDUCTION: This exam was performed according to the departmental dose-optimization program which includes automated exposure control, adjustment of the mA and/or kV according to patient size and/or use of iterative reconstruction technique. COMPARISON:  CT head dated May 23, 2021. MRI brain dated May 19, 2021. FINDINGS: Brain: Significantly improved vasogenic edema in the right greater than left posterior frontal lobes compared to prior study. Unchanged 7 mm focus of hyperdensity centered within the right frontal vasogenic edema, which could represent calcification or hemorrhage. No evidence of acute infarction, new hemorrhage, hydrocephalus, or extra-axial collection. Unchanged chronic dural thickening overlying the right cerebral hemisphere at the site of prior craniotomy. Stable mild atrophy.  Vascular: Calcified atherosclerosis at the skull base. No hyperdense vessel. Skull: Prior right pterional craniotomy again noted. No fracture or focal lesion. Sinuses/Orbits: No acute finding. Chronic bilateral ethmoid air cell and right greater than left maxillary sinus opacification. Other: None. IMPRESSION: 1. Significantly improved vasogenic edema in the right greater than left posterior frontal lobes compared to prior study. Unchanged 7 mm focus of hyperdensity centered within the right frontal vasogenic edema, which could represent calcification or hemorrhage. 2.  No new acute intracranial abnormality. Electronically Signed   By: Titus Dubin M.D.   On: 06/17/2021 15:40  ? ?CT ANGIO AO+BIFEM W & OR WO CONTRAST ? ?Result Date: 06/17/2021 ?CLINICAL DATA:  Bilateral leg numbness, headache and left hip pain since earlier today. History of lung carcinoma with brain metastatic disease. EXAM: CT ANGIOGRAPHY OF ABDOMINAL AORTA WITH ILIOFEMORAL RUNOFF TECHNIQUE: Multidetector CT imaging of the abdomen, pelvis and lower extremities was performed using the standard protocol during bolus administration of intravenous contrast. Multiplanar CT image reconstructions and MIPs were obtained to evaluate the vascular anatomy. RADIATION DOSE REDUCTION: This exam was performed according to the departmental dose-optimization program which includes automated exposure control, adjustment of the mA and/or kV according to patient size and/or use of iterative reconstruction technique. CONTRAST:  171mL OMNIPAQUE IOHEXOL 350 MG/ML SOLN COMPARISON:  None. FINDINGS: VASCULAR Aorta: Extensive atherosclerosis. Mural thrombus along the infrarenal aorta narrows the vessel by 50%. No aneurysm. No dissection. Celiac: Severe narrowing at the origin of the celiac axis, at least 90%, narrowing spanning 1.1 cm. SMA: Short focus of hemodynamically significant atherosclerotic narrowing 2 cm beyond the origin, narrowing an estimated 70%. Renals:  Calcified atherosclerotic plaque at the origin the right renal artery narrowed by an estimated 50%. Calcified atherosclerotic plaque at the origin of the left renal artery, with severe stenosis, at least 70%. IMA: Athe

## 2021-06-17 NOTE — Consult Note (Addendum)
? ?ASSESSMENT & PLAN  ? ?ACUTE ON CHRONIC LEFT LOWER EXTREMITY ISCHEMIA: Based on his history the patient has evidence of multilevel disease including significant inflow disease on the left.  He had hip thigh and calf claudication.  He may have had progression of this disease with an acute occlusion.  I would recommend heparinization if there is no contraindication with respect to his brain biopsy on 05/23/2021.  I would also obtain a CT angio of the aorta with runoff to further evaluate his multilevel arterial occlusive disease.  Currently the foot appears to be viable.  I will make further recommendations pending the results of his CT angio. ? ?ADDENDUM: I have reviewed his CT angiogram.  The patient has severe diffuse multilevel calcific disease with really no good options for revascularization.  This all appears chronic.  His entire aortoiliac system is heavily calcified and he would not be a candidate for aortofemoral bypass grafting.  He has diffuse iliac disease on the right and severe disease in the common femoral artery.  I do not think he would be a good candidate for iliac intervention on the right to provide inflow for a right to left femoral-femoral bypass.  His only option potentially could be an axillobifemoral bypass graft which would require extensive femoral  endarterectomies bilaterally and potentially a femoropopliteal bypass graft on the left into a diseased calcified popliteal artery.  Thus I would not be in a hurry to consider surgery unless his symptoms progressed significantly. ? ?Gae Gallop, MD ?7:18 PM ? ? ?REASON FOR CONSULT:   ? ?Possible ischemic left lower extremity.  The consult is requested by Dr. Jeanell Sparrow. ? ?HPI:  ? ?Roger Solis is a 66 y.o. male who developed the fairly sudden onset of pain and paresthesias in the left lower extremity in approximately 10 AM.  The symptoms have persisted.  He initially had pallor in the left leg but now the color has improved significantly.   ? ?The patient does have a history of peripheral arterial disease.  He is apparently undergone a right lower extremity intervention in [place] in 2015.  It sounds like this may have been a stent in the right superficial femoral artery.  He has not had any interventions on the left that he is aware of.  He describes a long history (over a year) of the left hip thigh and calf claudication.  He denies significant symptoms on the right side.  He does state that he had some rest pain on the left side also for about 2 weeks at night.  He denies any history of nonhealing wounds. ? ?This patient has a history of lung cancer with metastatic disease to the brain.  He did have a biopsy of the brain on 3/32/23 to rule out recurrent cancer and this reportedly showed necrotic tissue.  He is followed at the [place] with his cancer. ? ?His risk factors for peripheral arterial disease include hypercholesterolemia and tobacco use.  He states that he smokes a pack per day of cigarettes and has been smoking for over 40 years.  He denies any history of diabetes, hypertension, or family history of premature cardiovascular disease. ? ?Past Medical History:  ?Diagnosis Date  ? CAD (coronary artery disease)   ? History of brain cancer   ? HLD (hyperlipidemia)   ? Hypertension   ? PAD (peripheral artery disease) ([place])   ? ? ?Family History  ?Problem Relation Age of Onset  ? Heart disease Mother   ? Heart disease  Father   ? ? ?SOCIAL HISTORY: ?Social History  ? ?Tobacco Use  ? Smoking status: Every Day  ?  Packs/day: 1.00  ?  Types: Cigarettes  ? Smokeless tobacco: Never  ?Substance Use Topics  ? Alcohol use: Yes  ?  Comment: six pack a week  ? ? ?No Known Allergies ? ?Current Facility-Administered Medications  ?Medication Dose Route Frequency Provider Last Rate Last Admin  ? dexamethasone (DECADRON) tablet 2 mg  2 mg Oral Once Pattricia Boss, MD      ? sodium chloride flush (NS) 0.9 % injection 3 mL  3 mL Intravenous Once Redwine, Madison A, PA-C       ? ?Current Outpatient Medications  ?Medication Sig Dispense Refill  ? albuterol (VENTOLIN HFA) 108 (90 Base) MCG/ACT inhaler Inhale 1 puff into the lungs every 6 (six) hours as needed for shortness of breath.    ? dexamethasone (DECADRON) 1 MG tablet Take 3 tablets (3 mg total) by mouth daily with breakfast for 7 days, THEN 2 tablets (2 mg total) daily with breakfast for 7 days, THEN 1 tablet (1 mg total) daily with breakfast for 7 days. 42 tablet 0  ? levETIRAcetam (KEPPRA) 500 MG tablet Take 1 tablet (500 mg total) by mouth 2 (two) times daily. 60 tablet 1  ? nicotine (NICODERM CQ - DOSED IN MG/24 HOURS) 21 mg/24hr patch Place 1 patch (21 mg total) onto the skin daily. 28 patch 0  ? ondansetron (ZOFRAN-ODT) 4 MG disintegrating tablet Take 1 tablet (4 mg total) by mouth every 8 (eight) hours as needed for nausea or vomiting. 20 tablet 0  ? ? ?REVIEW OF SYSTEMS:  ?[X]  denotes positive finding, [ ]  denotes negative finding ?Cardiac  Comments:  ?Chest pain or chest pressure:    ?Shortness of breath upon exertion:    ?Short of breath when lying flat:    ?Irregular heart rhythm:    ?    ?Vascular    ?Pain in calf, thigh, or hip brought on by ambulation: x   ?Pain in feet at night that wakes you up from your sleep:  x   ?Blood clot in your veins:    ?Leg swelling:     ?    ?Pulmonary    ?Oxygen at home:    ?Productive cough:     ?Wheezing:     ?    ?Neurologic    ?Sudden weakness in arms or legs:     ?Sudden numbness in arms or legs:     ?Sudden onset of difficulty speaking or slurred speech:    ?Temporary loss of vision in one eye:     ?Problems with dizziness:     ?    ?Gastrointestinal    ?Blood in stool:     ?Vomited blood:     ?    ?Genitourinary    ?Burning when urinating:     ?Blood in urine:    ?    ?Psychiatric    ?Major depression:     ?    ?Hematologic    ?Bleeding problems:    ?Problems with blood clotting too easily:    ?    ?Skin    ?Rashes or ulcers:    ?    ?Constitutional    ?Fever or chills:     ?- ? ?PHYSICAL EXAM:  ? ?Vitals:  ? 06/17/21 1329  ?BP: 134/65  ?Pulse: (!) 107  ?Resp: 18  ?Temp: 98.3 ?F (36.8 ?C)  ?TempSrc: Oral  ?  SpO2: 94%  ? ?There is no height or weight on file to calculate BMI. ?GENERAL: The patient is a well-nourished male, in no acute distress. The vital signs are documented above. ?CARDIAC: There is a regular rate and rhythm.  ?VASCULAR: I do not detect carotid bruits. ?On the right side, he has a palpable femoral pulse.  I cannot palpate pedal pulses.  He does have a monophasic dorsalis pedis and posterior tibial signal with the Doppler. ?On the left side I cannot palpate a femoral pulse.  He has a barely audible dorsalis pedis signal with the Doppler.  I cannot obtain a posterior tibial signal or peroneal signal. ?The left foot does have a reasonable temperature.  It is only slightly cooler than the right foot.  He has slightly delayed capillary refill on the left. ?He is able to move the left foot. ?PULMONARY: There is good air exchange bilaterally without wheezing or rales. ?ABDOMEN: Soft and non-tender with normal pitched bowel sounds.  I do not palpate an aneurysm ?MUSCULOSKELETAL: There are no major deformities. ?NEUROLOGIC: No focal weakness or paresthesias are detected. ?SKIN: There are no ulcers or rashes noted. ?PSYCHIATRIC: The patient has a normal affect. ? ?DATA:   ? ?LABS: His GFR is greater than 60.  Creatinine is 0.6. ? ?CT HEAD: I reviewed his CT of the head that was done today which shows significant improvement of vasogenic edema in the right greater than left posterior frontal lobes compared to the previous study.  There is no new acute intracranial abnormality. ? ?Deitra Mayo ?[facility] ? ?

## 2021-06-17 NOTE — ED Triage Notes (Signed)
C/o bilateral leg numbness, headache, and L hip pain since 10-11am today.  No arm drift.  Pt had brain biopsy on 3/21.  Family states his Decadron is currently being tapered. Pt is a [place] oncology pt for lung cancer with brain mets. ?

## 2021-06-17 NOTE — Discharge Instructions (Signed)
These give additional 2 mg of Decadron tonight ?Please continue with 6 mg of Decadron per day until you are able to consult with Dr. Mickeal Skinner and decide on new dosing. ?Return to the emergency department if you are worse at any time. ?

## 2021-06-17 NOTE — ED Notes (Signed)
RN reviewed discharge instructions with pt. Pt verbalized understanding and had no further questions. VSS upon discharge.  

## 2021-06-20 ENCOUNTER — Other Ambulatory Visit: Payer: Self-pay | Admitting: Radiation Therapy

## 2021-07-14 ENCOUNTER — Ambulatory Visit
Admission: RE | Admit: 2021-07-14 | Discharge: 2021-07-14 | Disposition: A | Discharge status: Home / Self Care | Payer: No Typology Code available for payment source | Source: Ambulatory Visit | Attending: Internal Medicine | Admitting: Internal Medicine

## 2021-07-14 DIAGNOSIS — C3490 Malignant neoplasm of unspecified part of unspecified bronchus or lung: Secondary | ICD-10-CM

## 2021-07-14 IMAGING — MR MR HEAD WO/W CM
16 series · 48 of 48 positions shown · IV contrast (multihance)
Comparison: [DATE]

CLINICAL DATA: CNS neoplasm, assess treatment response. Recent
biopsy showing radiation necrosis.

EXAM:
MRI HEAD WITHOUT AND WITH CONTRAST
TECHNIQUE: Multiplanar, multiecho pulse sequences of the brain and surrounding
structures were obtained without and with intravenous contrast.
CONTRAST:  16mL MULTIHANCE GADOBENATE DIMEGLUMINE 529 MG/ML IV SOLN

[Series 5: T1 · sagittal · 4.0mm · 0.75mm/px · 1 of 31 slices shown (1 of 3)]
[im 1/31]
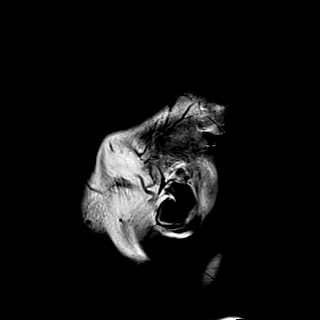

[Series 6: DWI · axial · 3.0mm · 0.94mm/px · z∈[-64,+76]mm · 7 of 159 slices shown (1 of 3)]
[im 1/159]
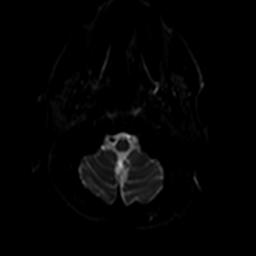
[im 27/159]
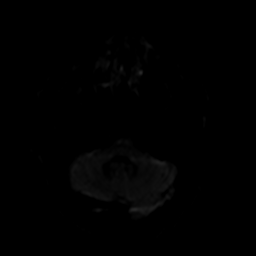
[im 53/159]
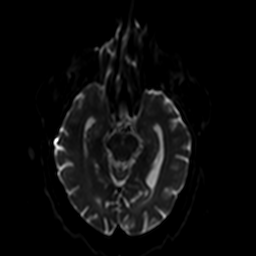
[im 80/159]
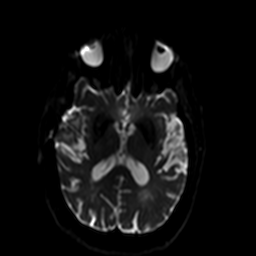
[im 106/159]
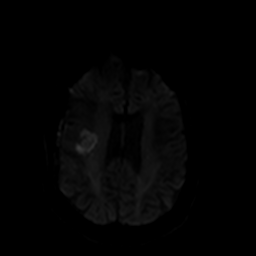
[im 132/159]
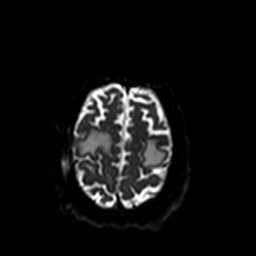
[im 159/159]
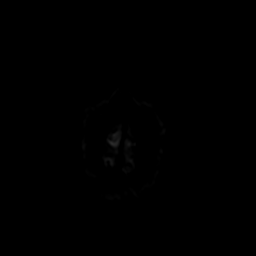

[Series 7: ax dwi_tracew · axial · 3.0mm · 0.94mm/px · z∈[-64,+76]mm · 3 of 79 slices shown]
[im 1/79]
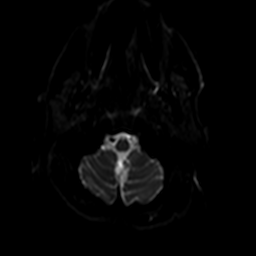
[im 40/79]
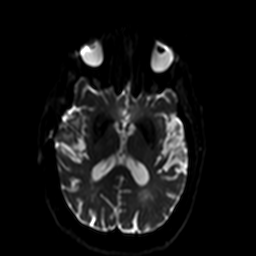
[im 79/79]
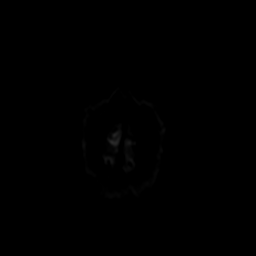

[Series 8: ax dwi_adc · axial · 3.0mm · 0.94mm/px · z∈[-64,+76]mm · 2 of 40 slices shown]
[im 1/40]
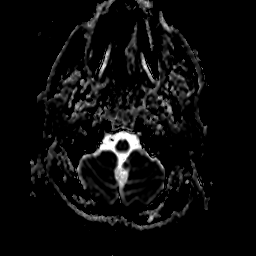
[im 40/40]
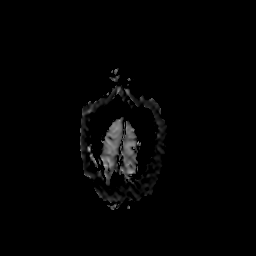

[Series 9: DWI · coronal · 5.0mm · 1.44mm/px · 2 of 60 slices shown (2 of 3)]
[im 1/60]
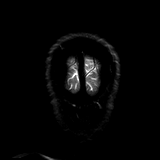
[im 60/60]
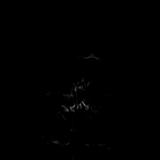

[Series 10: DWI · coronal · 5.0mm · 1.44mm/px · 1 of 30 slices shown (3 of 3)]
[im 1/30]
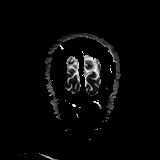

[Series 11: T2 · axial · 4.0mm · 0.36mm/px · 1 of 27 slices shown]
[im 1/27]
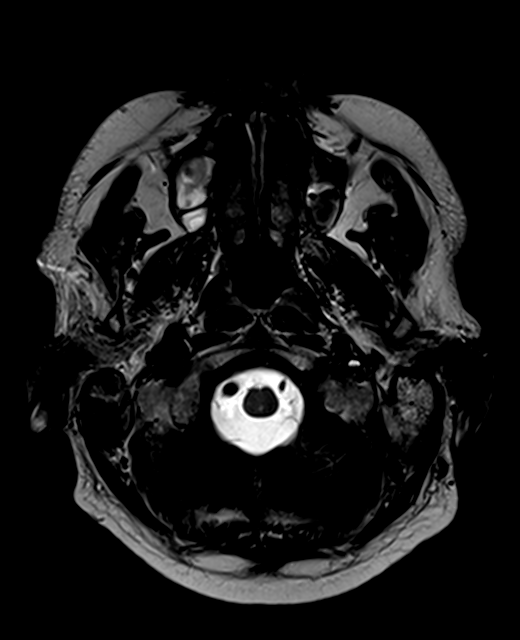

[Series 12: FLAIR · axial · 3.0mm · 0.72mm/px · 1 of 26 slices shown]
[im 1/26]
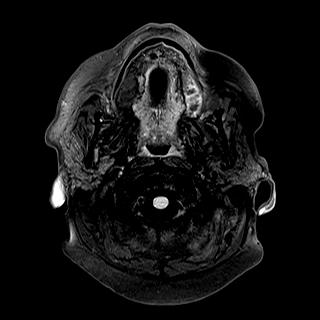

[Series 14: swi_images · axial · 1.5mm · 0.90mm/px · z∈[-32,+105]mm · 4 of 96 slices shown]
[im 1/96]
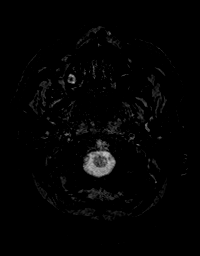
[im 32/96]
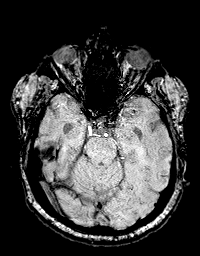
[im 64/96]
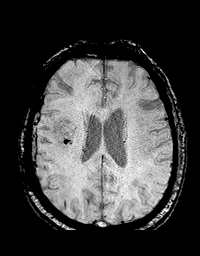
[im 96/96]
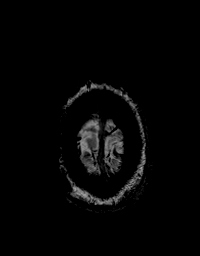

[Series 15: T1 · axial · 1.0mm · 0.94mm/px · z∈[-49,+106]mm · 7 of 160 slices shown (2 of 3)]
[im 1/160]
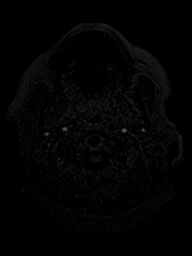
[im 27/160]
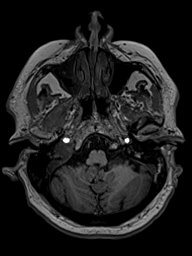
[im 54/160]
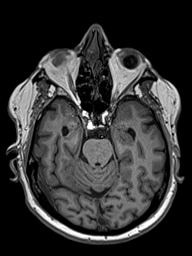
[im 80/160]
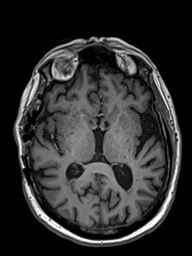
[im 107/160]
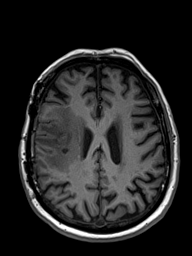
[im 133/160]
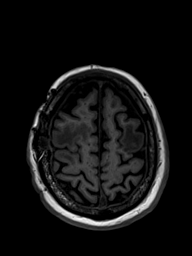
[im 160/160]
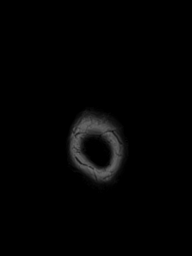

[Series 16: T2 post-contrast · coronal · 4.0mm · 0.36mm/px · 1 of 34 slices shown]
[im 1/34]
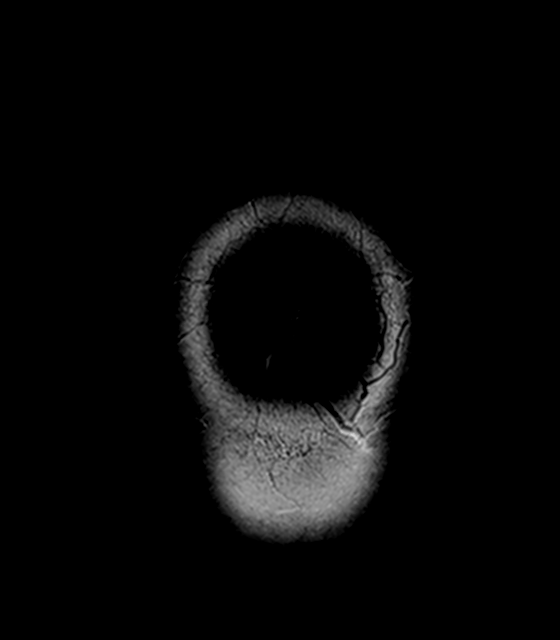

[Series 17: T1 · axial · 1.0mm · 0.94mm/px · z∈[-49,+106]mm · 7 of 160 slices shown (3 of 3)]
[im 1/160]
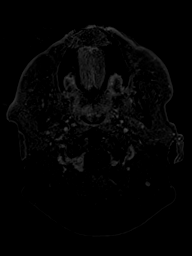
[im 27/160]
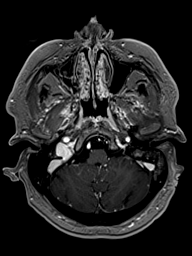
[im 54/160]
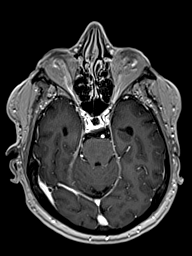
[im 80/160]
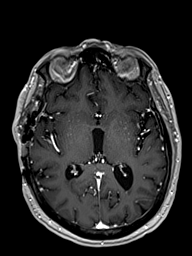
[im 107/160]
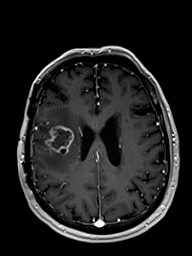
[im 133/160]
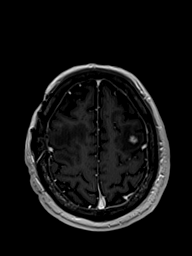
[im 160/160]
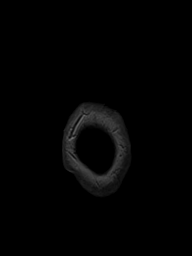

[Series 18: T1 post-contrast · coronal · 4.0mm · 0.72mm/px · 1 of 34 slices shown (1 of 4)]
[im 1/34]
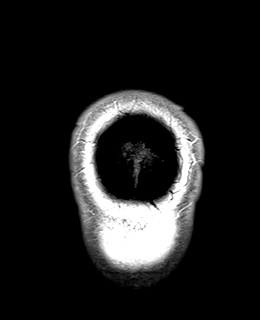

[Series 19: T1 post-contrast · sagittal · 4.0mm · 0.75mm/px · 1 of 31 slices shown (2 of 4)]
[im 1/31]
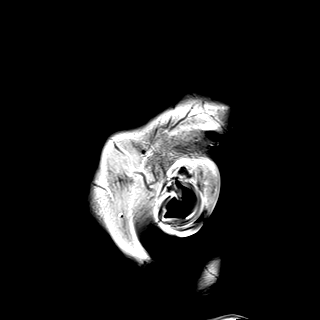

[Series 20: T1 post-contrast · coronal · 3.0mm · 0.57mm/px · 2 of 47 slices shown (3 of 4)]
[im 1/47]
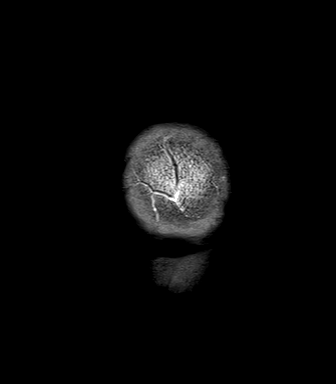
[im 47/47]
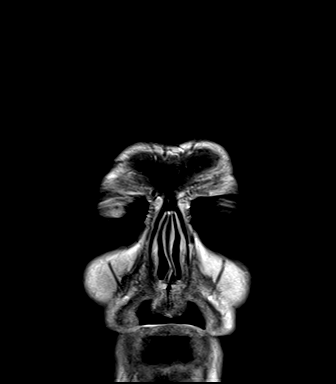

[Series 21: T1 post-contrast · axial · 1.0mm · 0.75mm/px · z∈[-67,+92]mm · 7 of 160 slices shown (4 of 4)]
[im 1/160]
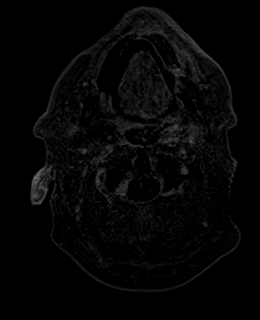
[im 27/160]
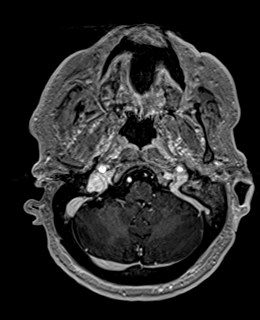
[im 54/160]
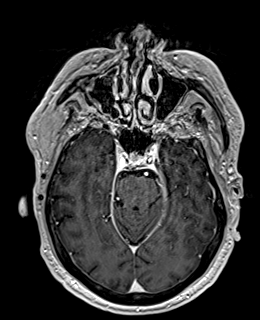
[im 80/160]
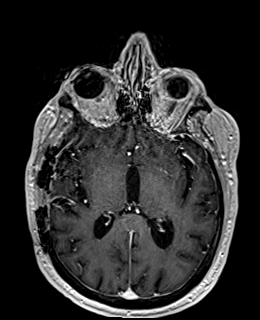
[im 107/160]
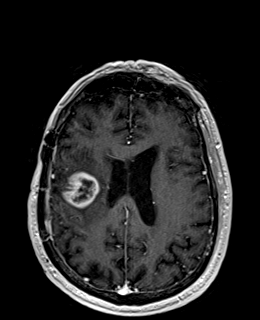
[im 133/160]
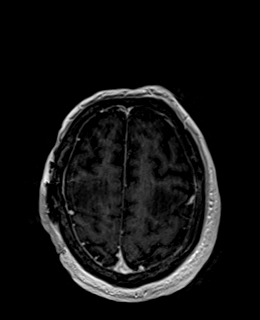
[im 160/160]
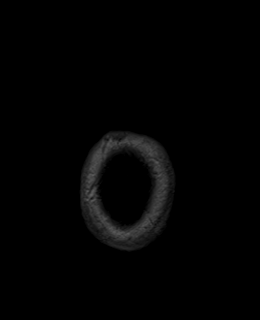

[48 of 48 positions shown; findings below may reference images not displayed]

FINDINGS: Brain: Right frontal lesion showing irregular nodular enhancement
and restricted diffusion. Dominant area of predominantly ring
enhancement measures up to 2.7 cm on [DATE], previously 2.5 cm. A
contiguous, distinct thin walled enhancing component at the superior
aspect of the lesion measures 13 mm, similar to before. There is a
small posterior nodular component measuring 9 mm on [DATE], new from
prior. Moderate regional vasogenic edema is improved.

Rim enhancing lesion with similar characteristics in the superior
left frontal lobe measuring 11 mm on [DATE], with mild adjacent
vasogenic edema.

No new lesion.  No infarct, hydrocephalus, or collection

Vascular: Major flow voids and vascular enhancements are preserved

Skull and upper cervical spine: Unremarkable remote craniotomy on
the right.

Sinuses/Orbits: Negative
IMPRESSION: 1. Mildly increased area of enhancement and restricted diffusion at
the site of radiation necrosis in the right cerebral hemisphere.
Associated vasogenic edema is moderate and improved.
2. 9 mm high left frontal lesion with similar imaging
characteristics also shows a mild size increase from prior.
3. No new lesion.

## 2021-07-14 MED ORDER — GADOBENATE DIMEGLUMINE 529 MG/ML IV SOLN
16.0000 mL | Freq: Once | INTRAVENOUS | Status: AC | PRN
  Administered 2021-07-14: 16 mL via INTRAVENOUS

## 2021-07-17 ENCOUNTER — Inpatient Hospital Stay: Payer: No Typology Code available for payment source | Attending: Radiation Oncology | Admitting: Internal Medicine

## 2021-07-17 ENCOUNTER — Telehealth: Payer: Self-pay | Admitting: Internal Medicine

## 2021-07-17 ENCOUNTER — Inpatient Hospital Stay: Payer: No Typology Code available for payment source

## 2021-07-17 ENCOUNTER — Other Ambulatory Visit: Payer: Self-pay

## 2021-07-17 VITALS — BP 137/74 | HR 92 | Temp 97.5°F | Resp 18 | Ht 73.0 in | Wt 177.1 lb

## 2021-07-17 DIAGNOSIS — E785 Hyperlipidemia, unspecified: Secondary | ICD-10-CM | POA: Diagnosis not present

## 2021-07-17 DIAGNOSIS — F17210 Nicotine dependence, cigarettes, uncomplicated: Secondary | ICD-10-CM | POA: Diagnosis not present

## 2021-07-17 DIAGNOSIS — I6789 Other cerebrovascular disease: Secondary | ICD-10-CM | POA: Insufficient documentation

## 2021-07-17 DIAGNOSIS — C3490 Malignant neoplasm of unspecified part of unspecified bronchus or lung: Secondary | ICD-10-CM | POA: Diagnosis not present

## 2021-07-17 DIAGNOSIS — Y842 Radiological procedure and radiotherapy as the cause of abnormal reaction of the patient, or of later complication, without mention of misadventure at the time of the procedure: Secondary | ICD-10-CM | POA: Diagnosis not present

## 2021-07-17 DIAGNOSIS — I2510 Atherosclerotic heart disease of native coronary artery without angina pectoris: Secondary | ICD-10-CM | POA: Diagnosis not present

## 2021-07-17 DIAGNOSIS — C7931 Secondary malignant neoplasm of brain: Secondary | ICD-10-CM | POA: Diagnosis present

## 2021-07-17 DIAGNOSIS — I739 Peripheral vascular disease, unspecified: Secondary | ICD-10-CM | POA: Insufficient documentation

## 2021-07-17 DIAGNOSIS — Z923 Personal history of irradiation: Secondary | ICD-10-CM | POA: Insufficient documentation

## 2021-07-17 DIAGNOSIS — R609 Edema, unspecified: Secondary | ICD-10-CM | POA: Insufficient documentation

## 2021-07-17 DIAGNOSIS — I10 Essential (primary) hypertension: Secondary | ICD-10-CM | POA: Insufficient documentation

## 2021-07-17 MED ORDER — ONDANSETRON 4 MG PO TBDP: 4.0000 mg | ORAL_TABLET | Freq: Three times a day (TID) | ORAL | 0 refills | Status: DC | PRN

## 2021-07-17 MED ORDER — LEVETIRACETAM 500 MG PO TABS: 500.0000 mg | ORAL_TABLET | Freq: Two times a day (BID) | ORAL | 2 refills | Status: DC

## 2021-07-17 NOTE — Progress Notes (Signed)
? ?[facility] [address]  ?[place], [place] [address] ?[phone] ? ? ?Interval Evaluation ? ?Date of Service: 07/17/21 ?Patient Name: Roger Solis ?Patient MRN: [id number] ?Patient DOB: 1955/11/25 ?Provider: Ventura Sellers, MD ? ?Identifying Statement:  ?Roger Solis is a 66 y.o. male with Lung cancer metastatic to brain [facility]) ? ?Radiation therapy induced brain necrosis  ? ?Primary Cancer: NSCLC (previously treated in [place], [place]) ? ?Oncologic History ?03/17/20: R frontal craniotomy, debulking in [place]  ?04/17/20: SRS to residual tumor ?05/23/21: Stereotactic biopsy for localized progression, path is necrosis ([place]) ? ?Interval History: ?Roger Solis presents today for follow up after recent MRI brain.  Patient and daughter describe decline in functional status since prior visit.  He has worsening weakness of his left leg, and numbness and pain in the leg with activity.  He did have an angiogram which demonstrated multiple blockages in the leg arteries, this weill be evaluated by vascular surgery in the coming weeks.  Daughter does describe a little "slowdown" in his memory and speed of thinking, cognition.  He has not been sleeping well, up frequently during the night.  Because of the leg issues he has been using a wheelchair.  No longer dosing decadron. ? ?H+P (06/15/21) Patient presents today for evaluation after hospitalization this past month for brain metastasis, inflammation.  He initially was diagnosed and treated last year in [place]), having undergone both surgical and radiosurgical interventions for right frontal lesion.  He subsequently moved to [place] in early 2023.  On 05/05/21, he presented to the ED with focal seizures c/w left sided shaking.  He was started on Keppra, MRI brain demonstrated right frontoparietal met without prior available for comparison.  Two weeks later he presented again, this time with left sided weakness.  MRI brain  at that time demonstrated progressive changes consistent with tumor progression or inflammation.  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  Since surgery he has been dosing decadron 59m BID.  He feels essentially at his baseline currently, not complaining of left sided weakness anymore.    ? ?Medications: ?Current Outpatient Medications on File Prior to Visit  ?Medication Sig Dispense Refill  ? albuterol (VENTOLIN HFA) 108 (90 Base) MCG/ACT inhaler Inhale 1 puff into the lungs every 6 (six) hours as needed for shortness of breath.    ? levETIRAcetam (KEPPRA) 500 MG tablet Take 1 tablet (500 mg total) by mouth 2 (two) times daily. 60 tablet 1  ? nicotine (NICODERM CQ - DOSED IN MG/24 HOURS) 21 mg/24hr patch Place 1 patch (21 mg total) onto the skin daily. 28 patch 0  ? ondansetron (ZOFRAN-ODT) 4 MG disintegrating tablet Take 1 tablet (4 mg total) by mouth every 8 (eight) hours as needed for nausea or vomiting. 20 tablet 0  ? ?No current facility-administered medications on file prior to visit.  ? ? ?Allergies: No Known Allergies ?Past Medical History:  ?Past Medical History:  ?Diagnosis Date  ? CAD (coronary artery disease)   ? History of brain cancer   ? HLD (hyperlipidemia)   ? Hypertension   ? PAD [place]ipheral artery disease) ([place]   ? ?Past Surgical History:  ?Past Surgical History:  ?Procedure Laterality Date  ? APPLICATION OF CRANIAL NAVIGATION N/A 05/23/2021  ? Procedure: APPLICATION OF CRANIAL NAVIGATION;  Surgeon: EKristeen Miss MD;  [facility]  Service: Neurosurgery;  Laterality: N/A;  ? FRAMELESS  BIOPSY WITH BRAINLAB N/A 05/23/2021  ? Procedure: Stereotactic  needle biopsy of the brain w/brainlab;  Surgeon: Kristeen Miss, MD;  Location: [facility];  Service: Neurosurgery;  Laterality: N/A;  ? ?Social History:  ?Social History  ? ?Socioeconomic History  ? Marital status: Single  ?  Spouse name: Not on file  ? Number of children: Not on file  ? Years of education: Not on  file  ? Highest education level: Not on file  ?Occupational History  ? Not on file  ?Tobacco Use  ? Smoking status: Every Day  ?  Packs/day: 1.00  ?  Types: Cigarettes  ? Smokeless tobacco: Never  ?Substance and Sexual Activity  ? Alcohol use: Yes  ?  Comment: six pack a week  ? Drug use: Never  ? Sexual activity: Not on file  ?Other Topics Concern  ? Not on file  ?Social History Narrative  ? Not on file  ? ?Social Determinants of Health  ? ?Financial Resource Strain: Not on file  ?Food Insecurity: Not on file  ?Transportation Needs: Not on file  ?Physical Activity: Not on file  ?Stress: Not on file  ?Social Connections: Not on file  ?Intimate Partner Violence: Not on file  ? ?Family History:  ?Family History  ?Problem Relation Age of Onset  ? Heart disease Mother   ? Heart disease Father   ? ? ?Review of Systems: ?Constitutional: Doesn't report fevers, chills or abnormal weight loss ?Eyes: Doesn't report blurriness of vision ?Ears, nose, mouth, throat, and face: Doesn't report sore throat ?Respiratory: Doesn't report cough, dyspnea or wheezes ?Cardiovascular: Doesn't report palpitation, chest discomfort  ?Gastrointestinal:  Doesn't report nausea, constipation, diarrhea ?GU: Doesn't report incontinence ?Skin: Doesn't report skin rashes ?Neurological: Per HPI ?Musculoskeletal: Doesn't report joint pain ?Behavioral/Psych: Doesn't report anxiety ? ? ?Physical Exam: ?Vitals:  ? 07/17/21 1109  ?BP: 137/74  ?Pulse: 92  ?Resp: 18  ?Temp: (!) 97.5 ?F (36.4 ?C)  ?SpO2: 95%  ? ?KPS: 70. ?General: Alert, cooperative, pleasant, in no acute distress ?Head: Normal ?EENT: No conjunctival injection or scleral icterus.  ?Lungs: Resp effort normal ?Cardiac: Regular rate ?Abdomen: Non-distended abdomen ?Skin: No rashes cyanosis or petechiae. ?Extremities: Edema in left leg, skin mottling ? ?Neurologic Exam: ?Mental Status: Awake, alert, attentive to examiner. Oriented to self and environment. Language is fluent with intact  comprehension.  Age advanced psychomotor slowing. ?Cranial Nerves: Visual acuity is grossly normal. Visual fields are full. Extra-ocular movements intact. No ptosis. Face is symmetric ?Motor: Tone and bulk are normal. Power is full in both arms, 4/5 in left leg. Reflexes are symmetric, no pathologic reflexes present.  ?Sensory: Intact to light touch ?Gait: Non ambulatory 2/2 paresis ? ? ?Labs: ?I have reviewed the data as listed ?   ?Component Value Date/Time  ? NA 135 06/17/2021 1616  ? K 4.0 06/17/2021 1616  ? CL 102 06/17/2021 1616  ? CO2 22 06/17/2021 1416  ? GLUCOSE 139 (H) 06/17/2021 1616  ? BUN 20 06/17/2021 1616  ? CREATININE 0.60 (L) 06/17/2021 1616  ? CALCIUM 8.1 (L) 06/17/2021 1416  ? PROT 5.9 (L) 06/17/2021 1416  ? ALBUMIN 2.9 (L) 06/17/2021 1416  ? AST 26 06/17/2021 1416  ? ALT 31 06/17/2021 1416  ? ALKPHOS 64 06/17/2021 1416  ? BILITOT 0.5 06/17/2021 1416  ? GFRNONAA >60 06/17/2021 1416  ? ?Lab Results  ?Component Value Date  ? WBC 10.0 06/17/2021  ? NEUTROABS 8.6 (H) 06/17/2021  ? HGB 13.9 06/17/2021  ? HCT 41.0 06/17/2021  ? MCV 94.6 06/17/2021  ? PLT 151  06/17/2021  ? ?Imaging: ? ?[facility] Clinician Interpretation: I have personally reviewed the CNS images as listed.  My interpretation, in the context of the patient's clinical presentation, is likely treatment effect ? ?CT HEAD WO CONTRAST ? ?Result Date: 06/17/2021 ?CLINICAL DATA:  Headache and bilateral leg numbness. History of lung cancer with brain metastasis. EXAM: CT HEAD WITHOUT CONTRAST TECHNIQUE: Contiguous axial images were obtained from the base of the skull through the vertex without intravenous contrast. RADIATION DOSE REDUCTION: This exam was performed according to the departmental dose-optimization program which includes automated exposure control, adjustment of the mA and/or kV according to patient size and/or use of iterative reconstruction technique. COMPARISON:  CT head dated May 23, 2021. MRI brain dated May 19, 2021. FINDINGS:  Brain: Significantly improved vasogenic edema in the right greater than left posterior frontal lobes compared to prior study. Unchanged 7 mm focus of hyperdensity centered within the right frontal vasogenic edema,

## 2021-07-17 NOTE — Telephone Encounter (Signed)
Per 5/15 los called and spoke to py about phone visit and in person visit.  Pt confirmed appointments  ?

## 2021-07-24 ENCOUNTER — Inpatient Hospital Stay (HOSPITAL_BASED_OUTPATIENT_CLINIC_OR_DEPARTMENT_OTHER): Payer: No Typology Code available for payment source | Admitting: Internal Medicine

## 2021-07-24 DIAGNOSIS — I6789 Other cerebrovascular disease: Secondary | ICD-10-CM

## 2021-07-24 DIAGNOSIS — C3490 Malignant neoplasm of unspecified part of unspecified bronchus or lung: Secondary | ICD-10-CM

## 2021-07-24 DIAGNOSIS — C7931 Secondary malignant neoplasm of brain: Secondary | ICD-10-CM | POA: Diagnosis not present

## 2021-07-24 DIAGNOSIS — Y842 Radiological procedure and radiotherapy as the cause of abnormal reaction of the patient, or of later complication, without mention of misadventure at the time of the procedure: Secondary | ICD-10-CM | POA: Diagnosis not present

## 2021-07-24 MED ORDER — DEXAMETHASONE 1 MG PO TABS: ORAL_TABLET | ORAL | 0 refills | Status: DC

## 2021-07-24 NOTE — Progress Notes (Signed)
I connected with Roger Solis on 07/24/21 at 12:00 PM EDT by telephone visit and verified that I am speaking with the correct person using two identifiers.  I discussed the limitations, risks, security and privacy concerns of performing an evaluation and management service by telemedicine and the availability of in-person appointments. I also discussed with the patient that there may be a patient responsible charge related to this service. The patient expressed understanding and agreed to proceed.  Other persons participating in the visit and their role in the encounter:  family member  Patient's location:  Home  Provider's location:  Office  Chief Complaint:  Lung cancer metastatic to brain [facility])  Radiation therapy induced brain necrosis  History of Present Ilness: Roger Solis describes clear improvement in left sided weakness since starting the decadron.  He also describes improvement in memory, cognition.  There does seem to be a little fluid retention in the legs/feet.  Otherwise no new changes.   Observations: Language and cognition at baseline  Imaging:  [place] Clinician Interpretation: I have personally reviewed the CNS images as listed.  My interpretation, in the context of the patient's clinical presentation, is likely treatment effect  MR BRAIN W WO CONTRAST  Result Date: 07/16/2021 CLINICAL DATA:  CNS neoplasm, assess treatment response. Recent biopsy showing radiation necrosis. EXAM: MRI HEAD WITHOUT AND WITH CONTRAST TECHNIQUE: Multiplanar, multiecho pulse sequences of the brain and surrounding structures were obtained without and with intravenous contrast. CONTRAST:  90mL MULTIHANCE GADOBENATE DIMEGLUMINE 529 MG/ML IV SOLN COMPARISON:  05/19/2021 FINDINGS: Brain: Right frontal lesion showing irregular nodular enhancement and restricted diffusion. Dominant area of predominantly ring enhancement measures up to 2.7 cm on 17:105, previously 2.5 cm. A contiguous, distinct thin walled  enhancing component at the superior aspect of the lesion measures 13 mm, similar to before. There is a small posterior nodular component measuring 9 mm on 17:106, new from prior. Moderate regional vasogenic edema is improved. Rim enhancing lesion with similar characteristics in the superior left frontal lobe measuring 11 mm on 17:36, with mild adjacent vasogenic edema. No new lesion.  No infarct, hydrocephalus, or collection Vascular: Major flow voids and vascular enhancements are preserved Skull and upper cervical spine: Unremarkable remote craniotomy on the right. Sinuses/Orbits: Negative IMPRESSION: 1. Mildly increased area of enhancement and restricted diffusion at the site of radiation necrosis in the right cerebral hemisphere. Associated vasogenic edema is moderate and improved. 2. 9 mm high left frontal lesion with similar imaging characteristics also shows a mild size increase from prior. 3. No new lesion. Electronically Signed   By: Jorje Guild M.D.   On: 07/16/2021 07:57    Assessment and Plan: Lung cancer metastatic to brain [facility])  Radiation therapy induced brain necrosis  Roger Solis is clinically improved today, now on 4mg  daily decadron x7 days.    We recommended decreasing daily dose by 1mg  every week until off.  If left sided weakness worsens, he will let us know and we will adjust dose.  We ask that Roger Solis return to clinic in 2 months following next brain MRI, or sooner as needed.  Follow Up Instructions: RTC in 2 months  I discussed the assessment and treatment plan with the patient.  The patient was provided an opportunity to ask questions and all were answered.  The patient agreed with the plan and demonstrated understanding of the instructions.    The patient was advised to call back or seek an in-person evaluation if the symptoms worsen or if  the condition fails to improve as anticipated.  I provided 5-10 minutes of non-face-to-face time during this  enocunter.  Ventura Sellers, MD   I provided 15 minutes of non face-to-face telephone visit time during this encounter, and > 50% was spent counseling as documented under my assessment & plan.

## 2021-07-27 ENCOUNTER — Other Ambulatory Visit: Payer: Self-pay | Admitting: Radiation Therapy

## 2021-08-02 ENCOUNTER — Other Ambulatory Visit: Payer: Self-pay | Admitting: *Deleted

## 2021-08-02 DIAGNOSIS — M25569 Pain in unspecified knee: Secondary | ICD-10-CM

## 2021-08-05 ENCOUNTER — Other Ambulatory Visit: Payer: Self-pay

## 2021-08-05 ENCOUNTER — Encounter (HOSPITAL_COMMUNITY): Payer: Self-pay

## 2021-08-05 ENCOUNTER — Inpatient Hospital Stay (HOSPITAL_COMMUNITY)
Admission: EM | Admit: 2021-08-05 | Discharge: 2021-08-09 | DRG: 240 | Disposition: A | Discharge status: Rehabilitation Facility | Payer: No Typology Code available for payment source | Attending: Family Medicine | Admitting: Family Medicine

## 2021-08-05 DIAGNOSIS — I2510 Atherosclerotic heart disease of native coronary artery without angina pectoris: Secondary | ICD-10-CM

## 2021-08-05 DIAGNOSIS — Z85118 Personal history of other malignant neoplasm of bronchus and lung: Secondary | ICD-10-CM | POA: Diagnosis not present

## 2021-08-05 DIAGNOSIS — I7025 Atherosclerosis of native arteries of other extremities with ulceration: Secondary | ICD-10-CM

## 2021-08-05 DIAGNOSIS — Z923 Personal history of irradiation: Secondary | ICD-10-CM | POA: Diagnosis not present

## 2021-08-05 DIAGNOSIS — G546 Phantom limb syndrome with pain: Secondary | ICD-10-CM | POA: Diagnosis present

## 2021-08-05 DIAGNOSIS — K5900 Constipation, unspecified: Secondary | ICD-10-CM | POA: Diagnosis present

## 2021-08-05 DIAGNOSIS — R569 Unspecified convulsions: Secondary | ICD-10-CM | POA: Diagnosis present

## 2021-08-05 DIAGNOSIS — I701 Atherosclerosis of renal artery: Secondary | ICD-10-CM | POA: Diagnosis present

## 2021-08-05 DIAGNOSIS — Z951 Presence of aortocoronary bypass graft: Secondary | ICD-10-CM

## 2021-08-05 DIAGNOSIS — Z79899 Other long term (current) drug therapy: Secondary | ICD-10-CM | POA: Diagnosis not present

## 2021-08-05 DIAGNOSIS — R531 Weakness: Secondary | ICD-10-CM | POA: Diagnosis present

## 2021-08-05 DIAGNOSIS — Z8249 Family history of ischemic heart disease and other diseases of the circulatory system: Secondary | ICD-10-CM | POA: Diagnosis not present

## 2021-08-05 DIAGNOSIS — I998 Other disorder of circulatory system: Secondary | ICD-10-CM | POA: Diagnosis not present

## 2021-08-05 DIAGNOSIS — F17210 Nicotine dependence, cigarettes, uncomplicated: Secondary | ICD-10-CM | POA: Diagnosis present

## 2021-08-05 DIAGNOSIS — Z8673 Personal history of transient ischemic attack (TIA), and cerebral infarction without residual deficits: Secondary | ICD-10-CM

## 2021-08-05 DIAGNOSIS — Z955 Presence of coronary angioplasty implant and graft: Secondary | ICD-10-CM | POA: Diagnosis not present

## 2021-08-05 DIAGNOSIS — L97829 Non-pressure chronic ulcer of other part of left lower leg with unspecified severity: Secondary | ICD-10-CM | POA: Diagnosis present

## 2021-08-05 DIAGNOSIS — C3490 Malignant neoplasm of unspecified part of unspecified bronchus or lung: Secondary | ICD-10-CM | POA: Diagnosis present

## 2021-08-05 DIAGNOSIS — I70262 Atherosclerosis of native arteries of extremities with gangrene, left leg: Principal | ICD-10-CM | POA: Diagnosis present

## 2021-08-05 DIAGNOSIS — D62 Acute posthemorrhagic anemia: Secondary | ICD-10-CM | POA: Diagnosis not present

## 2021-08-05 DIAGNOSIS — Z87898 Personal history of other specified conditions: Secondary | ICD-10-CM

## 2021-08-05 DIAGNOSIS — C7931 Secondary malignant neoplasm of brain: Secondary | ICD-10-CM | POA: Diagnosis present

## 2021-08-05 DIAGNOSIS — Z89612 Acquired absence of left leg above knee: Secondary | ICD-10-CM | POA: Diagnosis not present

## 2021-08-05 DIAGNOSIS — Z9889 Other specified postprocedural states: Secondary | ICD-10-CM | POA: Diagnosis not present

## 2021-08-05 DIAGNOSIS — I10 Essential (primary) hypertension: Secondary | ICD-10-CM

## 2021-08-05 DIAGNOSIS — E8809 Other disorders of plasma-protein metabolism, not elsewhere classified: Secondary | ICD-10-CM | POA: Diagnosis not present

## 2021-08-05 DIAGNOSIS — I739 Peripheral vascular disease, unspecified: Secondary | ICD-10-CM | POA: Diagnosis not present

## 2021-08-05 DIAGNOSIS — L03116 Cellulitis of left lower limb: Secondary | ICD-10-CM | POA: Diagnosis present

## 2021-08-05 DIAGNOSIS — I2583 Coronary atherosclerosis due to lipid rich plaque: Secondary | ICD-10-CM

## 2021-08-05 DIAGNOSIS — I745 Embolism and thrombosis of iliac artery: Secondary | ICD-10-CM | POA: Diagnosis present

## 2021-08-05 DIAGNOSIS — E785 Hyperlipidemia, unspecified: Secondary | ICD-10-CM | POA: Diagnosis present

## 2021-08-05 DIAGNOSIS — K5901 Slow transit constipation: Secondary | ICD-10-CM | POA: Diagnosis not present

## 2021-08-05 DIAGNOSIS — L97509 Non-pressure chronic ulcer of other part of unspecified foot with unspecified severity: Secondary | ICD-10-CM | POA: Diagnosis not present

## 2021-08-05 DIAGNOSIS — D72829 Elevated white blood cell count, unspecified: Secondary | ICD-10-CM | POA: Diagnosis not present

## 2021-08-05 DIAGNOSIS — Z4781 Encounter for orthopedic aftercare following surgical amputation: Secondary | ICD-10-CM | POA: Diagnosis not present

## 2021-08-05 LAB — COMPREHENSIVE METABOLIC PANEL
ALT: 21 U/L (ref 0–44)
AST: 24 U/L (ref 15–41)
Albumin: 2.8 g/dL — ABNORMAL LOW (ref 3.5–5.0)
Alkaline Phosphatase: 63 U/L (ref 38–126)
Anion gap: 11 (ref 5–15)
BUN: 20 mg/dL (ref 8–23)
CO2: 23 mmol/L (ref 22–32)
Calcium: 8.9 mg/dL (ref 8.9–10.3)
Chloride: 101 mmol/L (ref 98–111)
Creatinine, Ser: 0.67 mg/dL (ref 0.61–1.24)
GFR, Estimated: >60 mL/min (ref >60)
Glucose, Bld: 149 mg/dL — ABNORMAL HIGH (ref 70–99)
Potassium: 4 mmol/L (ref 3.5–5.1)
Sodium: 135 mmol/L (ref 135–145)
Total Bilirubin: 0.4 mg/dL (ref 0.3–1.2)
Total Protein: 6.4 g/dL — ABNORMAL LOW (ref 6.5–8.1)

## 2021-08-05 LAB — CBC WITH DIFFERENTIAL/PLATELET
Abs Immature Granulocytes: 0.25 10*3/uL — ABNORMAL HIGH (ref 0.00–0.07)
Basophils Absolute: 0 10*3/uL (ref 0.0–0.1)
Basophils Relative: 0 %
Eosinophils Absolute: 0 10*3/uL (ref 0.0–0.5)
Eosinophils Relative: 0 %
HCT: 37.6 % — ABNORMAL LOW (ref 39.0–52.0)
Hemoglobin: 12.3 g/dL — ABNORMAL LOW (ref 13.0–17.0)
Immature Granulocytes: 1 %
Lymphocytes Relative: 6 %
Lymphs Abs: 1.1 10*3/uL (ref 0.7–4.0)
MCH: 31 pg (ref 26.0–34.0)
MCHC: 32.7 g/dL (ref 30.0–36.0)
MCV: 94.7 fL (ref 80.0–100.0)
Monocytes Absolute: 0.7 10*3/uL (ref 0.1–1.0)
Monocytes Relative: 4 %
Neutro Abs: 17.6 10*3/uL — ABNORMAL HIGH (ref 1.7–7.7)
Neutrophils Relative %: 89 %
Platelets: 222 10*3/uL (ref 150–400)
RBC: 3.97 MIL/uL — ABNORMAL LOW (ref 4.22–5.81)
RDW: 15.7 % — ABNORMAL HIGH (ref 11.5–15.5)
WBC: 19.6 10*3/uL — ABNORMAL HIGH (ref 4.0–10.5)
nRBC: 0 % (ref 0.0–0.2)

## 2021-08-05 LAB — URINALYSIS, ROUTINE W REFLEX MICROSCOPIC
Bilirubin Urine: NEGATIVE
Glucose, UA: NEGATIVE mg/dL
Hgb urine dipstick: NEGATIVE
Ketones, ur: NEGATIVE mg/dL
Leukocytes,Ua: NEGATIVE
Nitrite: NEGATIVE
Protein, ur: NEGATIVE mg/dL
Specific Gravity, Urine: 1.028 (ref 1.005–1.030)
pH: 5 (ref 5.0–8.0)

## 2021-08-05 LAB — LACTIC ACID, PLASMA: Lactic Acid, Venous: 1.6 mmol/L (ref 0.5–1.9)

## 2021-08-05 MED ORDER — HYDROMORPHONE HCL 1 MG/ML IJ SOLN
1.0000 mg | INTRAMUSCULAR | Status: DC | PRN
  Administered 2021-08-06 (×2): 1 mg via INTRAVENOUS
  Filled 2021-08-05 (×2): qty 1

## 2021-08-05 MED ORDER — DEXAMETHASONE 0.5 MG PO TABS
1.0000 mg | ORAL_TABLET | Freq: Every day | ORAL | Status: DC
  Administered 2021-08-07 – 2021-08-09 (×3): 1 mg via ORAL
  Filled 2021-08-05 (×3): qty 2

## 2021-08-05 MED ORDER — ATORVASTATIN CALCIUM 80 MG PO TABS
80.0000 mg | ORAL_TABLET | Freq: Every day | ORAL | Status: DC
  Administered 2021-08-07 – 2021-08-09 (×3): 80 mg via ORAL
  Filled 2021-08-05 (×3): qty 1

## 2021-08-05 MED ORDER — HYDROCODONE-ACETAMINOPHEN 5-325 MG PO TABS
1.0000 | ORAL_TABLET | Freq: Once | ORAL | Status: AC
  Administered 2021-08-05: 1 via ORAL
  Filled 2021-08-05: qty 1

## 2021-08-05 MED ORDER — ALBUTEROL SULFATE (2.5 MG/3ML) 0.083% IN NEBU: 3.0000 mL | INHALATION_SOLUTION | Freq: Four times a day (QID) | RESPIRATORY_TRACT | Status: DC | PRN

## 2021-08-05 MED ORDER — LACTATED RINGERS IV BOLUS
1000.0000 mL | Freq: Once | INTRAVENOUS | Status: AC
Start: 2021-08-05 — End: 2021-08-05
  Administered 2021-08-05: 1000 mL via INTRAVENOUS

## 2021-08-05 MED ORDER — PIPERACILLIN-TAZOBACTAM 3.375 G IVPB 30 MIN
3.3750 g | Freq: Once | INTRAVENOUS | Status: AC
  Administered 2021-08-05: 3.375 g via INTRAVENOUS
  Filled 2021-08-05: qty 50

## 2021-08-05 MED ORDER — VANCOMYCIN HCL 1750 MG/350ML IV SOLN
1750.0000 mg | Freq: Once | INTRAVENOUS | Status: AC
Start: 2021-08-05 — End: 2021-08-06
  Administered 2021-08-05: 1750 mg via INTRAVENOUS
  Filled 2021-08-05: qty 350

## 2021-08-05 MED ORDER — HYDROMORPHONE HCL 1 MG/ML IJ SOLN
1.0000 mg | Freq: Once | INTRAMUSCULAR | Status: AC
  Administered 2021-08-05: 1 mg via INTRAVENOUS
  Filled 2021-08-05: qty 1

## 2021-08-05 MED ORDER — PIPERACILLIN-TAZOBACTAM 3.375 G IVPB
3.3750 g | Freq: Three times a day (TID) | INTRAVENOUS | Status: AC
  Administered 2021-08-06 – 2021-08-07 (×6): 3.375 g via INTRAVENOUS
  Filled 2021-08-05 (×7): qty 50

## 2021-08-05 MED ORDER — DEXAMETHASONE 4 MG PO TABS: 2.0000 mg | ORAL_TABLET | Freq: Every day | ORAL | Status: AC

## 2021-08-05 MED ORDER — FENTANYL CITRATE PF 50 MCG/ML IJ SOSY
25.0000 ug | PREFILLED_SYRINGE | Freq: Once | INTRAMUSCULAR | Status: AC
  Administered 2021-08-05: 25 ug via INTRAVENOUS
  Filled 2021-08-05: qty 1

## 2021-08-05 MED ORDER — GABAPENTIN 100 MG PO CAPS
100.0000 mg | ORAL_CAPSULE | Freq: Three times a day (TID) | ORAL | Status: DC
  Administered 2021-08-05 – 2021-08-09 (×10): 100 mg via ORAL
  Filled 2021-08-05 (×11): qty 1

## 2021-08-05 MED ORDER — LEVETIRACETAM 500 MG PO TABS
500.0000 mg | ORAL_TABLET | Freq: Two times a day (BID) | ORAL | Status: DC
  Administered 2021-08-05 – 2021-08-09 (×7): 500 mg via ORAL
  Filled 2021-08-05 (×7): qty 1

## 2021-08-05 MED ORDER — ONDANSETRON HCL 4 MG/2ML IJ SOLN
4.0000 mg | Freq: Four times a day (QID) | INTRAMUSCULAR | Status: DC | PRN
  Administered 2021-08-06 – 2021-08-09 (×2): 4 mg via INTRAVENOUS
  Filled 2021-08-05 (×2): qty 2

## 2021-08-05 MED ORDER — VANCOMYCIN HCL 1250 MG/250ML IV SOLN
1250.0000 mg | Freq: Two times a day (BID) | INTRAVENOUS | Status: DC
  Filled 2021-08-05 (×2): qty 250

## 2021-08-05 NOTE — Progress Notes (Signed)
Pharmacy Antibiotic Note  Roger Solis is a 66 y.o. male for which pharmacy has been consulted for vancomycin and zosyn dosing for cellulitis.  Patient with a history of chronic left lower extremity ischemia on 06/17/2021 & lung cancer with metastatic disease to the brain. Patient presenting with left leg pain and redness and concern for infection.  SCr 0.67 WBC 19.6; LA 1.6; T 98.7 F; HR 101; RR 18  Plan: Zosyn 3.375g IV q8h (4 hour infusion) Vancomycin 1750 mg once then 1250 mg q12hr (eAUC 480) unless change in renal function Trend WBC, Fever, Renal function, & Clinical course F/u cultures, clinical course, WBC, fever De-escalate when able Levels at steady state  Height: 6\' 1"  (185.4 cm) Weight: 79.8 kg (176 lb) IBW/kg (Calculated) : 79.9  Temp (24hrs), Avg:98.7 F (37.1 C), Min:98.7 F (37.1 C), Max:98.7 F (37.1 C)  Recent Labs  Lab 08/05/21 1802  WBC 19.6*  CREATININE 0.67  LATICACIDVEN 1.6    Estimated Creatinine Clearance: 103.9 mL/min (by C-G formula based on SCr of 0.67 mg/dL).    No Known Allergies  Antimicrobials this admission: zosyn 6/3 >>  vancomycin 6/3 >>  Microbiology results: Pending  Thank you for allowing pharmacy to be a part of this patient's care.  Lorelei Pont, PharmD, BCPS 08/05/2021 7:44 PM ED Clinical Pharmacist -  [phone]

## 2021-08-05 NOTE — Assessment & Plan Note (Addendum)
CT angio in April shows multiple levels of hemodynamically severe vascular disease.  All of which are chronic. -Narrowing of the infrarenal abdominal aorta by 50%. -  50% narrowing of the right renal artery.  Severe narrowing of the left renal artery. -There is also distal right external iliac and common femoral artery stenosis to 60%.  Hemodynamically significant narrowing of the right popliteal artery.  Patent right femoral artery stent. -Has occluded left common iliac artery with near occlusion of the left external iliac artery.  Occluded left femoral artery and narrowing of left popliteal artery. -Dr. Scot Dock with vascular surgery has evaluated  -Patient will required an above-knee amputation given poor healing of a below-knee amputation with his inflow disease on the left.  Surgery to take place tomorrow.  Keep n.p.o. at midnight. -Continue IV antibiotics with vancomycin and Zosyn.  Blood cultures are pending.

## 2021-08-05 NOTE — Assessment & Plan Note (Signed)
Last admitted in March 2023 for seizure.  Continue home [place].

## 2021-08-05 NOTE — ED Triage Notes (Signed)
Pt BIB POV in his Rt foot, he has weeping edema in his legs, draining puss all over the foot for the past 3-4 days, his toes are changing colors. Rates pain 8/10 that is a shooting pain that goes up into his knee.

## 2021-08-05 NOTE — H&P (Signed)
History and Physical    Patient: Roger Solis ZOX:[id number] DOB: December 17, 1955 DOA: 08/05/2021 DOS: the patient was seen and examined on 08/05/2021 PCP: [facility]  Patient coming from: Home  Chief Complaint:  Chief Complaint  Patient presents with   Right Foot pain   HPI: Roger Solis is a 66 y.o. 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  He was recently hospitalized him March with acute onset left-sided weakness of his arm, leg and face with CT head showing vasogenic edema and MRI concerning for tumor or brain abscess.  He was treated with IV dexamethasone and home Keppra with improvement in his weakness.  He had stereotactic biopsy of the right frontal lobe lesion which later was found to be radiation therapy induced brain necrosis.  Patient has been following with oncology Dr. Mickeal Skinner for worsening left leg weakness, numbness and pain with activity.  Oncology resumed him on Decadron 4 mg daily and patient had reported improvement in symptoms.  Has also been following with vascular surgery with recent CT angio showing multiple areas of hemodynamically significant vascular disease.  Had CT angio in April showing multilevel severe stenosis to bilateral lower extremity.  Family noticed discoloration of the left heel with a blister that started to weep and eventually ruptured.  Patient having severe pain and discomfort.     In the ED, he was afebrile mildly hypertensive, tachycardic with leukocytosis of 19.6 K.  BMP is unremarkable.  He was evaluated by vascular surgeon Dr. Scot Dock who recommends left above-knee amputation in the morning due to severe symptoms.   Review of Systems: As mentioned in the  history of present illness. All other systems reviewed and are negative. Past Medical History:  Diagnosis Date   CAD (coronary artery disease)    History of brain cancer    HLD (hyperlipidemia)    Hypertension    PAD (peripheral artery disease) ([facility])    Past Surgical History:  Procedure Laterality Date   APPLICATION OF CRANIAL NAVIGATION N/A 05/23/2021   Procedure: APPLICATION OF CRANIAL NAVIGATION;  Surgeon: Kristeen Miss, MD;  Location: [facility];  Service: Neurosurgery;  Laterality: N/A;   FRAMELESS  BIOPSY WITH BRAINLAB N/A 05/23/2021   Procedure: Stereotactic needle biopsy of the brain w/brainlab;  Surgeon: Kristeen Miss, MD;  Location: [facility];  Service: Neurosurgery;  Laterality: N/A;   Social History:  reports that he has been smoking cigarettes. He has been smoking an average of 1 pack per day. He has never used smokeless tobacco. He reports current alcohol use. He reports that he does not use drugs.  No Known Allergies  Family History  Problem Relation Age of Onset   Heart disease Mother    Heart disease Father     Prior to Admission medications   Medication Sig Start Date End Date Taking? Authorizing Provider  albuterol (VENTOLIN HFA) 108 (90 Base) MCG/ACT inhaler Inhale 1 puff into the lungs every 6 (six) hours as needed for shortness of breath. 10/18/20  Yes [provider]  atorvastatin (LIPITOR) 80 MG tablet Take 80 mg by mouth daily.   Yes [provider]  cephALEXin (KEFLEX) 500 MG capsule Take 500 mg by mouth 3 (three) times daily.   Yes [provider]  dexamethasone (DECADRON) 1  MG tablet Take 3 tablets (3 mg total) by mouth daily with breakfast for 7 days, THEN 2 tablets (2 mg total) daily with breakfast for 7 days, THEN 1 tablet (1 mg total) daily with breakfast for 7 days. 07/24/21 08/14/21 Yes Vaslow, Acey Lav, MD  gabapentin (NEURONTIN) 100 MG capsule Take 100 mg by mouth 3 (three) times daily.   Yes [provider]  levETIRAcetam  (KEPPRA) 500 MG tablet Take 1 tablet (500 mg total) by mouth 2 (two) times daily. 07/17/21  Yes Vaslow, Acey Lav, MD  ondansetron (ZOFRAN-ODT) 4 MG disintegrating tablet Take 1 tablet (4 mg total) by mouth every 8 (eight) hours as needed for nausea or vomiting. 07/17/21  Yes Ventura Sellers, MD    Physical Exam: Vitals:   08/05/21 1726 08/05/21 1758 08/05/21 2133  BP: (!) 143/74  (!) 177/75  Pulse: (!) 101  82  Resp: 18  18  Temp: 98.7 F (37.1 C)    TempSrc: Oral    SpO2: 97%  97%  Weight:  79.8 kg   Height:  6\' 1"  (1.854 m)    Constitutional: NAD, calm, sitting upright on side of the bed with legs crossed in slight discomfort Eyes: PERRL, lids and conjunctivae normal ENMT: Mucous membranes are moist.  Neck: normal, supple,  Respiratory: clear to auscultation bilaterally, no wheezing, no crackles. Normal respiratory effort. No accessory muscle use.  Cardiovascular: Regular rate and rhythm, no murmurs / rubs / gallops.   Abdomen: no tenderness, no masses palpated. No hepatosplenomegaly. Bowel sounds positive.  Musculoskeletal: + 3 pitting edema of bilateral lower extremity.  There is a necrotic left heel ulcer skin with weeping around the left foot and widespread erythema. No palpable dorsalis pedis pulse.  Weak dorsalis pedis pulse on the right foot     Skin: Left heel necrotic wound as above Neurologic: CN 2-12 grossly intact. Strength 5/5 in all 4.  Psychiatric: Normal judgment and insight. Alert and oriented x 3. Normal mood. 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  All of which are chronic. -Narrowing of the infrarenal abdominal aorta by 50%. -  50% narrowing of the right renal artery.  Severe narrowing of the left renal artery. -There is also distal right external iliac and common femoral artery stenosis  to 60%.  Hemodynamically significant narrowing of the right popliteal artery.  Patent right femoral artery stent. -Has occluded left common iliac artery with near occlusion of the left external iliac artery.  Occluded left femoral artery and narrowing of left popliteal artery. -Dr. Scot Dock with vascular surgery has evaluated  -Patient will required an above-knee amputation given poor healing of a below-knee amputation with his inflow disease on the left.  Surgery to take place tomorrow.  Keep n.p.o. at midnight. -Continue IV antibiotics with vancomycin and Zosyn.  Blood cultures are pending.  History of CVA (cerebrovascular accident) Patient no longer on aspirin for unclear reasons.  Although suspect could be related to his metastatic brain disease.  HTN (hypertension) Elevated due to pain.  Not on antihypertensives.  CAD (coronary artery disease) S/p CABG, PCI s/p DES -Asymptomatic -Continue atorvastatin.  Unclear why patient stopped aspirin though suspect could be related to his brain metastasis.  History of seizure Last admitted in March 2023 for seizure.  Continue home [place].  Lung cancer metastatic to brain [facility]) Follows  with Dr. Mickeal Skinner with oncology.  Currently on tapering Decadron for left sided weakness due to vasogenic edema.  Continue home Decadron.      Advance Care Planning:   Code Status: Full Code   Consults: Vascular surgery  Family Communication: Daughter at bedside  Severity of Illness: The appropriate patient status for this patient is INPATIENT. Inpatient status is judged to be reasonable and necessary in order to provide the required intensity of service to ensure the patient's safety. The patient's presenting symptoms, physical exam findings, and initial radiographic and laboratory data in the context of their chronic comorbidities is felt to place them at high risk for further clinical deterioration. Furthermore, it is not anticipated that the patient will be  medically stable for discharge from the hospital within 2 midnights of admission.   * I certify that at the point of admission it is my clinical judgment that the patient will require inpatient hospital care spanning beyond 2 midnights from the point of admission due to high intensity of service, high risk for further deterioration and high frequency of surveillance required.*  Author: Orene Desanctis, DO 08/05/2021 10:57 PM  For on call review [email or web address].

## 2021-08-05 NOTE — Assessment & Plan Note (Signed)
Elevated due to pain.  Not on antihypertensives.

## 2021-08-05 NOTE — Consult Note (Signed)
ASSESSMENT & PLAN   ISCHEMIC LEFT FOOT: This patient has an ischemic left foot with gangrene and no options for revascularization based on his previous CT angiogram in April.  I do not think that he would heal a below the knee amputation given his inflow disease on the left.  I think the only option is a left above-the-knee amputation.  I have discussed this with the patient and his daughter and they are agreeable to proceed.  They would like to proceed tomorrow given the amount of pain that he is having.  We have discussed the indications for the procedure and the potential complications and he is agreeable to proceed.  Agree with plans for intravenous antibiotics.  Blood cultures have been sent.  I have written preop orders for tomorrow.  REASON FOR CONSULT:    Ischemic left foot.  The consult is requested by Dr. Regenia Skeeter.   HPI:   Roger Solis is a 66 y.o. male who had previously seen with chronic left lower extremity ischemia on 06/17/2021.  He underwent a CT angiogram at that time which showed severe diffuse multilevel disease with no good options for revascularization.  This all appeared chronic.  At that time his symptoms in the left foot were tolerable he was actually scheduled to see me next week.  However the the foot has gradually progressed and has been in severe pain over the last several days.  He presented to the emergency department.  Patient has a history of lung cancer with metastatic disease to the brain.  He had biopsy to rule out recurrent cancer in March of this year and this reportedly showed necrotic tissue.  He is followed at the [place] with his cancer.  He continues to smoke.  He has stable pain in the right foot.   Past Medical History:  Diagnosis Date   CAD (coronary artery disease)    History of brain cancer    HLD (hyperlipidemia)    Hypertension    PAD (peripheral artery disease) (HCC)     Family History  Problem Relation Age of Onset   Heart disease  Mother    Heart disease Father     SOCIAL HISTORY: Social History   Tobacco Use   Smoking status: Every Day    Packs/day: 1.00    Types: Cigarettes   Smokeless tobacco: Never  Substance Use Topics   Alcohol use: Yes    Comment: six pack a week    No Known Allergies  Current Facility-Administered Medications  Medication Dose Route Frequency Provider Last Rate Last Admin   piperacillin-tazobactam (ZOSYN) IVPB 3.375 g  3.375 g Intravenous Once Sherwood Gambler, MD 100 mL/hr at 08/05/21 2021 3.375 g at 08/05/21 2021   Followed by   Derrill Memo ON 08/06/2021] piperacillin-tazobactam (ZOSYN) IVPB 3.375 g  3.375 g Intravenous Q8H Sherwood Gambler, MD       vancomycin (VANCOREADY) IVPB 1750 mg/350 mL  1,750 mg Intravenous Once Sherwood Gambler, MD       Current Outpatient Medications  Medication Sig Dispense Refill   albuterol (VENTOLIN HFA) 108 (90 Base) MCG/ACT inhaler Inhale 1 puff into the lungs every 6 (six) hours as needed for shortness of breath.     dexamethasone (DECADRON) 1 MG tablet Take 3 tablets (3 mg total) by mouth daily with breakfast for 7 days, THEN 2 tablets (2 mg total) daily with breakfast for 7 days, THEN 1 tablet (1 mg total) daily with breakfast for 7 days. 42 tablet 0  levETIRAcetam (KEPPRA) 500 MG tablet Take 1 tablet (500 mg total) by mouth 2 (two) times daily. 120 tablet 2   ondansetron (ZOFRAN-ODT) 4 MG disintegrating tablet Take 1 tablet (4 mg total) by mouth every 8 (eight) hours as needed for nausea or vomiting. 20 tablet 0    REVIEW OF SYSTEMS:  [X]  denotes positive finding, [ ]  denotes negative finding Cardiac  Comments:  Chest pain or chest pressure:    Shortness of breath upon exertion:    Short of breath when lying flat:    Irregular heart rhythm:        Vascular    Pain in calf, thigh, or hip brought on by ambulation: x   Pain in feet at night that wakes you up from your sleep:  x   Blood clot in your veins:    Leg swelling:         Pulmonary     Oxygen at home:    Productive cough:     Wheezing:         Neurologic    Sudden weakness in arms or legs:     Sudden numbness in arms or legs:     Sudden onset of difficulty speaking or slurred speech:    Temporary loss of vision in one eye:     Problems with dizziness:         Gastrointestinal    Blood in stool:     Vomited blood:         Genitourinary    Burning when urinating:     Blood in urine:        Psychiatric    Major depression:         Hematologic    Bleeding problems:    Problems with blood clotting too easily:        Skin    Rashes or ulcers: x       Constitutional    Fever or chills:    -  PHYSICAL EXAM:   Vitals:   08/05/21 1726 08/05/21 1758  BP: (!) 143/74   Pulse: (!) 101   Resp: 18   Temp: 98.7 F (37.1 C)   TempSrc: Oral   SpO2: 97%   Weight:  79.8 kg  Height:  6\' 1"  (1.854 m)   Body mass index is 23.22 kg/m. GENERAL: The patient is a well-nourished male, in no acute distress. The vital signs are documented above. CARDIAC: There is a regular rate and rhythm.  VASCULAR: I do not detect carotid bruits. On the right side he has a diminished femoral pulse.  He has a dorsalis pedis and posterior tibial signal with the Doppler. On the left side I cannot palpate a femoral pulse.  I cannot obtain Doppler signals in the left foot.  The left foot is swollen and ischemic with gangrenous changes to the heel.     PULMONARY: There is good air exchange bilaterally without wheezing or rales. ABDOMEN: Soft and non-tender with normal pitched bowel sounds.  MUSCULOSKELETAL: There are no major deformities. NEUROLOGIC: No focal weakness or paresthesias are detected. SKIN: There are no ulcers or rashes noted. PSYCHIATRIC: The patient has a normal affect.  DATA:    LABS: His GFR is greater than 60.  Potassium is 4.0.  White blood cell count 19.6.  Hemoglobin 12.3.  Hematocrit 37.6.  Platelets 222,000.  Deitra Mayo [facility]

## 2021-08-05 NOTE — Assessment & Plan Note (Addendum)
Follows with Dr. Mickeal Skinner with oncology.  Currently on tapering Decadron for left sided weakness due to vasogenic edema.  Continue home Decadron.

## 2021-08-05 NOTE — Assessment & Plan Note (Signed)
Patient no longer on aspirin for unclear reasons.  Although suspect could be related to his metastatic brain disease.

## 2021-08-05 NOTE — Assessment & Plan Note (Signed)
S/p CABG, PCI s/p DES -Asymptomatic -Continue atorvastatin.  Unclear why patient stopped aspirin though suspect could be related to his brain metastasis.

## 2021-08-05 NOTE — ED Provider Notes (Signed)
[facility] EMERGENCY DEPARTMENT Provider Note   CSN: [id number] Arrival date & time: 08/05/21  1720     History  Chief Complaint  Patient presents with   Right Foot pain    Roger Solis is a 66 y.o. male.  HPI 66 year old male presents with left leg pain and redness and concern for infection.  History is from patient and family.  He has been dealing with pain and swelling to his left foot for about 6 days.  Has been to the [place] multiple times.  Has had multiple scans that he states said he did not have a "blood clot".  Leg is turning more red and has been cold according to family.  No fevers.  Has had clear and green discharge.  The pain is severe.  He has known history of arterial disease and is due to see Dr. Lou Cal.  Home Medications Prior to Admission medications   Medication Sig Start Date End Date Taking? Authorizing Provider  albuterol (VENTOLIN HFA) 108 (90 Base) MCG/ACT inhaler Inhale 1 puff into the lungs every 6 (six) hours as needed for shortness of breath. 10/18/20  Yes [provider]  atorvastatin (LIPITOR) 80 MG tablet Take 80 mg by mouth daily.   Yes [provider]  cephALEXin (KEFLEX) 500 MG capsule Take 500 mg by mouth 3 (three) times daily.   Yes [provider]  dexamethasone (DECADRON) 1 MG tablet Take 3 tablets (3 mg total) by mouth daily with breakfast for 7 days, THEN 2 tablets (2 mg total) daily with breakfast for 7 days, THEN 1 tablet (1 mg total) daily with breakfast for 7 days. 07/24/21 08/14/21 Yes Vaslow, Acey Lav, MD  gabapentin (NEURONTIN) 100 MG capsule Take 100 mg by mouth 3 (three) times daily.   Yes [provider]  levETIRAcetam (KEPPRA) 500 MG tablet Take 1 tablet (500 mg total) by mouth 2 (two) times daily. 07/17/21  Yes Vaslow, Acey Lav, MD  ondansetron (ZOFRAN-ODT) 4 MG disintegrating tablet Take 1 tablet (4 mg total) by mouth every 8 (eight) hours as needed for nausea or vomiting. 07/17/21   Yes Vaslow, Acey Lav, MD      Allergies    Patient has no known allergies.    Review of Systems   Review of Systems  Cardiovascular:  Positive for leg swelling.  Musculoskeletal:  Positive for arthralgias.  Skin:  Positive for color change.   Physical Exam Updated Vital Signs BP (!) 177/75 (BP Location: Right Arm)   Pulse 82   Temp 98.7 F (37.1 C) (Oral)   Resp 18   Ht 6\' 1"  (1.854 m)   Wt 79.8 kg   SpO2 97%   BMI 23.22 kg/m  Physical Exam Vitals and nursing note reviewed.  Constitutional:      Appearance: He is well-developed.  HENT:     Head: Normocephalic and atraumatic.  Cardiovascular:     Rate and Rhythm: Normal rate and regular rhythm.     Pulses:          Dorsalis pedis pulses are detected w/ Doppler on the right side and 0 on the left side.       Posterior tibial pulses are 0 on the left side.  Pulmonary:     Effort: Pulmonary effort is normal.  Abdominal:     Tenderness: There is no abdominal tenderness.  Musculoskeletal:     Comments: See picture.  I am unable to Doppler a pulse in the left foot in  either DP or PT.  He has diffuse swelling and pain and cool sensation to the left foot compared to the right.  This goes up to the mid lower leg with erythema.  There is skin breakdown.  Skin:    General: Skin is warm and dry.  Neurological:     Mental Status: He is alert.       ED Results / Procedures / Treatments   Labs (all labs ordered are listed, but only abnormal results are displayed) Labs Reviewed  COMPREHENSIVE METABOLIC PANEL - Abnormal; Notable for the following components:      Result Value   Glucose, Bld 149 (*)    Total Protein 6.4 (*)    Albumin 2.8 (*)    All other components within normal limits  CBC WITH DIFFERENTIAL/PLATELET - Abnormal; Notable for the following components:   WBC 19.6 (*)    RBC 3.97 (*)    Hemoglobin 12.3 (*)    HCT 37.6 (*)    RDW 15.7 (*)    Neutro Abs 17.6 (*)    Abs Immature Granulocytes 0.25 (*)     All other components within normal limits  CULTURE, BLOOD (ROUTINE X 2)  CULTURE, BLOOD (ROUTINE X 2)  LACTIC ACID, PLASMA  URINALYSIS, ROUTINE W REFLEX MICROSCOPIC  LACTIC ACID, PLASMA  CBC    EKG None  Radiology No results found.  Procedures .Critical Care Performed by: Sherwood Gambler, MD Authorized by: Sherwood Gambler, MD   Critical care provider statement:    Critical care time (minutes):  35   Critical care time was exclusive of:  Separately billable procedures and treating other patients   Critical care was necessary to treat or prevent imminent or life-threatening deterioration of the following conditions:  Sepsis and circulatory failure   Critical care was time spent personally by me on the following activities:  Development of treatment plan with patient or surrogate, discussions with consultants, evaluation of patient's response to treatment, examination of patient, ordering and review of laboratory studies, ordering and review of radiographic studies, ordering and performing treatments and interventions, pulse oximetry, re-evaluation of patient's condition and review of old charts    Medications Ordered in ED Medications  piperacillin-tazobactam (ZOSYN) IVPB 3.375 g (0 g Intravenous Stopped 08/05/21 2129)    Followed by  piperacillin-tazobactam (ZOSYN) IVPB 3.375 g (has no administration in time range)  vancomycin (VANCOREADY) IVPB 1250 mg/250 mL (has no administration in time range)  HYDROmorphone (DILAUDID) injection 1 mg (has no administration in time range)  atorvastatin (LIPITOR) tablet 80 mg (has no administration in time range)  dexamethasone (DECADRON) tablet 2 mg (has no administration in time range)  dexamethasone (DECADRON) tablet 1 mg (has no administration in time range)  gabapentin (NEURONTIN) capsule 100 mg (100 mg Oral Given 08/05/21 2304)  levETIRAcetam (KEPPRA) tablet 500 mg (500 mg Oral Given 08/05/21 2304)  albuterol (PROVENTIL) (2.5 MG/3ML) 0.083%  nebulizer solution 3 mL (has no administration in time range)  ondansetron (ZOFRAN) injection 4 mg (has no administration in time range)  HYDROmorphone (DILAUDID) injection 1 mg (1 mg Intravenous Given 08/05/21 1958)  lactated ringers bolus 1,000 mL (0 mLs Intravenous Stopped 08/05/21 2129)  vancomycin (VANCOREADY) IVPB 1750 mg/350 mL (1,750 mg Intravenous New Bag/Given 08/05/21 2132)  fentaNYL (SUBLIMAZE) injection 25 mcg (25 mcg Intravenous Given 08/05/21 2302)  HYDROcodone-acetaminophen (NORCO/VICODIN) 5-325 MG per tablet 1 tablet (1 tablet Oral Given 08/05/21 2304)    ED Course/ Medical Decision Making/ A&P Clinical Course as of 08/05/21 2344  Sat Aug 05, 2021  1956 I discussed case with Dr. Scot Dock who will come consult.  We will keep n.p.o.  He advises to hold off on further imaging and he will see patient. [SG]    Clinical Course User Index [SG] Sherwood Gambler, MD                           Medical Decision Making Amount and/or Complexity of Data Reviewed Independent Historian: spouse External Data Reviewed: notes. Labs: ordered.  Risk Prescription drug management. Decision regarding hospitalization.   Patient unfortunately doesn't have a dopplerable pulse in the left foot. I discussed with Dr. Scot Dock as above.  He was given IV pain control as well as IV antibiotics and fluids.  There probably is a cellulitis component as well.  Vascular is asking for hospitalist admit and he will take to the OR tomorrow for AKA.  Discussed with Dr. Flossie Buffy for admission.  He does have a leukocytosis of 19,000 but no lactic acidosis or hypotension to suggest septic shock.  Cultures were obtained.        Final Clinical Impression(s) / ED Diagnoses Final diagnoses:  Ischemic foot  Cellulitis of left foot    Rx / DC Orders ED Discharge Orders     None         Sherwood Gambler, MD 08/05/21 2344

## 2021-08-06 ENCOUNTER — Encounter (HOSPITAL_COMMUNITY): Payer: Self-pay | Admitting: Family Medicine

## 2021-08-06 ENCOUNTER — Inpatient Hospital Stay (HOSPITAL_COMMUNITY): Payer: No Typology Code available for payment source | Admitting: Certified Registered"

## 2021-08-06 ENCOUNTER — Encounter (HOSPITAL_COMMUNITY)
Admission: EM | Disposition: A | Discharge status: Rehabilitation Facility | Payer: Self-pay | Source: Home / Self Care | Attending: Family Medicine

## 2021-08-06 ENCOUNTER — Other Ambulatory Visit: Payer: Self-pay

## 2021-08-06 DIAGNOSIS — I7025 Atherosclerosis of native arteries of other extremities with ulceration: Secondary | ICD-10-CM | POA: Diagnosis not present

## 2021-08-06 DIAGNOSIS — I10 Essential (primary) hypertension: Secondary | ICD-10-CM

## 2021-08-06 DIAGNOSIS — I2510 Atherosclerotic heart disease of native coronary artery without angina pectoris: Secondary | ICD-10-CM | POA: Diagnosis not present

## 2021-08-06 DIAGNOSIS — L97509 Non-pressure chronic ulcer of other part of unspecified foot with unspecified severity: Secondary | ICD-10-CM | POA: Diagnosis not present

## 2021-08-06 DIAGNOSIS — I998 Other disorder of circulatory system: Secondary | ICD-10-CM

## 2021-08-06 DIAGNOSIS — I739 Peripheral vascular disease, unspecified: Secondary | ICD-10-CM

## 2021-08-06 HISTORY — PX: AMPUTATION: SHX166

## 2021-08-06 LAB — CBC
HCT: 33.7 % — ABNORMAL LOW (ref 39.0–52.0)
Hemoglobin: 11.1 g/dL — ABNORMAL LOW (ref 13.0–17.0)
MCH: 30.9 pg (ref 26.0–34.0)
MCHC: 32.9 g/dL (ref 30.0–36.0)
MCV: 93.9 fL (ref 80.0–100.0)
Platelets: 195 10*3/uL (ref 150–400)
RBC: 3.59 MIL/uL — ABNORMAL LOW (ref 4.22–5.81)
RDW: 15.6 % — ABNORMAL HIGH (ref 11.5–15.5)
WBC: 15.9 10*3/uL — ABNORMAL HIGH (ref 4.0–10.5)
nRBC: 0 % (ref 0.0–0.2)

## 2021-08-06 LAB — SURGICAL PCR SCREEN
MRSA, PCR: NEGATIVE
Staphylococcus aureus: NEGATIVE

## 2021-08-06 SURGERY — AMPUTATION, ABOVE KNEE
Anesthesia: General | Site: Knee | Laterality: Left

## 2021-08-06 MED ORDER — ALUM & MAG HYDROXIDE-SIMETH 200-200-20 MG/5ML PO SUSP: 15.0000 mL | ORAL | Status: DC | PRN

## 2021-08-06 MED ORDER — MIDAZOLAM HCL 2 MG/2ML IJ SOLN
INTRAMUSCULAR | Status: AC
  Filled 2021-08-06: qty 2

## 2021-08-06 MED ORDER — PHENYLEPHRINE HCL-NACL 20-0.9 MG/250ML-% IV SOLN
INTRAVENOUS | Status: DC | PRN
  Administered 2021-08-06: 45 ug/min via INTRAVENOUS

## 2021-08-06 MED ORDER — MIDAZOLAM HCL 2 MG/2ML IJ SOLN
INTRAMUSCULAR | Status: DC | PRN
  Administered 2021-08-06: 2 mg via INTRAVENOUS

## 2021-08-06 MED ORDER — FENTANYL CITRATE (PF) 100 MCG/2ML IJ SOLN
INTRAMUSCULAR | Status: AC
  Administered 2021-08-06: 50 ug via INTRAVENOUS
  Filled 2021-08-06: qty 2

## 2021-08-06 MED ORDER — 0.9 % SODIUM CHLORIDE (POUR BTL) OPTIME
TOPICAL | Status: DC | PRN
  Administered 2021-08-06: 1000 mL

## 2021-08-06 MED ORDER — SENNOSIDES-DOCUSATE SODIUM 8.6-50 MG PO TABS
1.0000 | ORAL_TABLET | Freq: Every evening | ORAL | Status: DC | PRN
  Administered 2021-08-07: 1 via ORAL
  Filled 2021-08-06: qty 1

## 2021-08-06 MED ORDER — PROPOFOL 10 MG/ML IV BOLUS
INTRAVENOUS | Status: DC | PRN
  Administered 2021-08-06: 30 mg via INTRAVENOUS
  Administered 2021-08-06: 100 mg via INTRAVENOUS

## 2021-08-06 MED ORDER — ACETAMINOPHEN 325 MG PO TABS: 325.0000 mg | ORAL_TABLET | Freq: Four times a day (QID) | ORAL | Status: DC | PRN

## 2021-08-06 MED ORDER — FENTANYL CITRATE (PF) 250 MCG/5ML IJ SOLN
INTRAMUSCULAR | Status: AC
  Filled 2021-08-06: qty 5

## 2021-08-06 MED ORDER — BACITRACIN ZINC 500 UNIT/GM EX OINT
TOPICAL_OINTMENT | CUTANEOUS | Status: DC | PRN
  Administered 2021-08-06: 1 via TOPICAL

## 2021-08-06 MED ORDER — METOPROLOL TARTRATE 5 MG/5ML IV SOLN: 2.0000 mg | INTRAVENOUS | Status: DC | PRN

## 2021-08-06 MED ORDER — GUAIFENESIN-DM 100-10 MG/5ML PO SYRP: 15.0000 mL | ORAL_SOLUTION | ORAL | Status: DC | PRN

## 2021-08-06 MED ORDER — ONDANSETRON HCL 4 MG/2ML IJ SOLN
4.0000 mg | Freq: Once | INTRAMUSCULAR | Status: AC | PRN
  Administered 2021-08-06: 4 mg via INTRAVENOUS

## 2021-08-06 MED ORDER — ORAL CARE MOUTH RINSE: 15.0000 mL | Freq: Once | OROMUCOSAL | Status: DC

## 2021-08-06 MED ORDER — PROPOFOL 10 MG/ML IV BOLUS
INTRAVENOUS | Status: AC
  Filled 2021-08-06: qty 20

## 2021-08-06 MED ORDER — HYDROCODONE-ACETAMINOPHEN 7.5-325 MG PO TABS
1.0000 | ORAL_TABLET | ORAL | Status: DC | PRN
  Administered 2021-08-06 – 2021-08-09 (×14): 2 via ORAL
  Filled 2021-08-06 (×15): qty 2

## 2021-08-06 MED ORDER — LABETALOL HCL 5 MG/ML IV SOLN: 10.0000 mg | INTRAVENOUS | Status: DC | PRN

## 2021-08-06 MED ORDER — AMISULPRIDE (ANTIEMETIC) 5 MG/2ML IV SOLN: 10.0000 mg | Freq: Once | INTRAVENOUS | Status: DC | PRN

## 2021-08-06 MED ORDER — HYDRALAZINE HCL 20 MG/ML IJ SOLN
5.0000 mg | Freq: Once | INTRAMUSCULAR | Status: AC
  Administered 2021-08-06: 5 mg via INTRAVENOUS

## 2021-08-06 MED ORDER — CHLORHEXIDINE GLUCONATE 0.12 % MT SOLN
OROMUCOSAL | Status: AC
  Filled 2021-08-06: qty 15

## 2021-08-06 MED ORDER — DOCUSATE SODIUM 100 MG PO CAPS
100.0000 mg | ORAL_CAPSULE | Freq: Every day | ORAL | Status: DC
  Administered 2021-08-07 – 2021-08-09 (×3): 100 mg via ORAL
  Filled 2021-08-06 (×3): qty 1

## 2021-08-06 MED ORDER — PHENYLEPHRINE 80 MCG/ML (10ML) SYRINGE FOR IV PUSH (FOR BLOOD PRESSURE SUPPORT)
PREFILLED_SYRINGE | INTRAVENOUS | Status: DC | PRN
  Administered 2021-08-06 (×3): 80 ug via INTRAVENOUS

## 2021-08-06 MED ORDER — LACTATED RINGERS IV SOLN: INTRAVENOUS | Status: DC

## 2021-08-06 MED ORDER — MORPHINE SULFATE (PF) 2 MG/ML IV SOLN
0.5000 mg | INTRAVENOUS | Status: DC | PRN
  Administered 2021-08-07: 0.5 mg via INTRAVENOUS
  Administered 2021-08-07: 1 mg via INTRAVENOUS
  Administered 2021-08-07: 0.5 mg via INTRAVENOUS
  Administered 2021-08-08 – 2021-08-09 (×2): 1 mg via INTRAVENOUS
  Filled 2021-08-06 (×5): qty 1

## 2021-08-06 MED ORDER — BACITRACIN ZINC 500 UNIT/GM EX OINT
TOPICAL_OINTMENT | CUTANEOUS | Status: AC
  Filled 2021-08-06: qty 28.35

## 2021-08-06 MED ORDER — ACETAMINOPHEN 10 MG/ML IV SOLN: 1000.0000 mg | Freq: Once | INTRAVENOUS | Status: DC | PRN

## 2021-08-06 MED ORDER — FENTANYL CITRATE (PF) 250 MCG/5ML IJ SOLN
INTRAMUSCULAR | Status: DC | PRN
  Administered 2021-08-06: 50 ug via INTRAVENOUS

## 2021-08-06 MED ORDER — SUGAMMADEX SODIUM 200 MG/2ML IV SOLN
INTRAVENOUS | Status: DC | PRN
  Administered 2021-08-06: 200 mg via INTRAVENOUS

## 2021-08-06 MED ORDER — HYDRALAZINE HCL 20 MG/ML IJ SOLN
10.0000 mg | INTRAMUSCULAR | Status: DC | PRN
  Administered 2021-08-07: 10 mg via INTRAVENOUS
  Filled 2021-08-06: qty 1

## 2021-08-06 MED ORDER — ACETAMINOPHEN 500 MG PO TABS
500.0000 mg | ORAL_TABLET | Freq: Four times a day (QID) | ORAL | Status: AC
  Administered 2021-08-07: 500 mg via ORAL
  Filled 2021-08-06: qty 1

## 2021-08-06 MED ORDER — VANCOMYCIN HCL 1250 MG/250ML IV SOLN
1250.0000 mg | Freq: Two times a day (BID) | INTRAVENOUS | Status: AC
  Administered 2021-08-06 – 2021-08-07 (×3): 1250 mg via INTRAVENOUS
  Filled 2021-08-06 (×3): qty 250

## 2021-08-06 MED ORDER — HYDRALAZINE HCL 20 MG/ML IJ SOLN: 5.0000 mg | INTRAMUSCULAR | Status: DC | PRN

## 2021-08-06 MED ORDER — ONDANSETRON HCL 4 MG/2ML IJ SOLN
INTRAMUSCULAR | Status: AC
  Filled 2021-08-06: qty 2

## 2021-08-06 MED ORDER — MAGNESIUM SULFATE 2 GM/50ML IV SOLN: 2.0000 g | Freq: Every day | INTRAVENOUS | Status: DC | PRN

## 2021-08-06 MED ORDER — HYDRALAZINE HCL 20 MG/ML IJ SOLN
INTRAMUSCULAR | Status: AC
  Filled 2021-08-06: qty 1

## 2021-08-06 MED ORDER — FENTANYL CITRATE (PF) 100 MCG/2ML IJ SOLN
25.0000 ug | INTRAMUSCULAR | Status: DC | PRN
  Administered 2021-08-06: 50 ug via INTRAVENOUS

## 2021-08-06 MED ORDER — NICOTINE 21 MG/24HR TD PT24
21.0000 mg | MEDICATED_PATCH | Freq: Every day | TRANSDERMAL | Status: DC
Start: 2021-08-06 — End: 2021-08-09
  Administered 2021-08-07 – 2021-08-09 (×3): 21 mg via TRANSDERMAL
  Filled 2021-08-06 (×4): qty 1

## 2021-08-06 MED ORDER — LIDOCAINE 2% (20 MG/ML) 5 ML SYRINGE
INTRAMUSCULAR | Status: DC | PRN
  Administered 2021-08-06: 60 mg via INTRAVENOUS

## 2021-08-06 MED ORDER — ONDANSETRON HCL 4 MG/2ML IJ SOLN
INTRAMUSCULAR | Status: DC | PRN
  Administered 2021-08-06: 4 mg via INTRAVENOUS

## 2021-08-06 MED ORDER — CHLORHEXIDINE GLUCONATE 0.12 % MT SOLN: 15.0000 mL | Freq: Once | OROMUCOSAL | Status: DC

## 2021-08-06 MED ORDER — PHENOL 1.4 % MT LIQD
1.0000 | OROMUCOSAL | Status: DC | PRN
Start: 2021-08-06 — End: 2021-08-09

## 2021-08-06 MED ORDER — PANTOPRAZOLE SODIUM 40 MG PO TBEC
40.0000 mg | DELAYED_RELEASE_TABLET | Freq: Every day | ORAL | Status: DC
  Administered 2021-08-06 – 2021-08-09 (×4): 40 mg via ORAL
  Filled 2021-08-06 (×4): qty 1

## 2021-08-06 MED ORDER — HYDROCODONE-ACETAMINOPHEN 5-325 MG PO TABS
1.0000 | ORAL_TABLET | ORAL | Status: DC | PRN
  Administered 2021-08-06 – 2021-08-08 (×2): 2 via ORAL
  Filled 2021-08-06 (×2): qty 2

## 2021-08-06 MED ORDER — ROCURONIUM BROMIDE 10 MG/ML (PF) SYRINGE
PREFILLED_SYRINGE | INTRAVENOUS | Status: DC | PRN
  Administered 2021-08-06: 70 mg via INTRAVENOUS

## 2021-08-06 MED ORDER — POTASSIUM CHLORIDE CRYS ER 20 MEQ PO TBCR: 20.0000 meq | EXTENDED_RELEASE_TABLET | Freq: Every day | ORAL | Status: DC | PRN

## 2021-08-06 SURGICAL SUPPLY — 59 items
BAG COUNTER SPONGE SURGICOUNT (BAG) ×2 IMPLANT
BAG SPNG CNTER NS LX DISP (BAG) ×1
BANDAGE ESMARK 6X9 LF (GAUZE/BANDAGES/DRESSINGS) ×1 IMPLANT
BLADE SAW RECIP 87.9 MT (BLADE) ×2 IMPLANT
BNDG CMPR 9X6 STRL LF SNTH (GAUZE/BANDAGES/DRESSINGS) ×2
BNDG COHESIVE 6X5 TAN STRL LF (GAUZE/BANDAGES/DRESSINGS) ×2 IMPLANT
BNDG ELASTIC 4X5.8 VLCR STR LF (GAUZE/BANDAGES/DRESSINGS) ×3 IMPLANT
BNDG ELASTIC 6X5.8 VLCR STR LF (GAUZE/BANDAGES/DRESSINGS) ×1 IMPLANT
BNDG ESMARK 6X9 LF (GAUZE/BANDAGES/DRESSINGS) ×4
BNDG GAUZE ELAST 4 BULKY (GAUZE/BANDAGES/DRESSINGS) ×2 IMPLANT
CANISTER SUCT 3000ML PPV (MISCELLANEOUS) ×2 IMPLANT
COVER SURGICAL LIGHT HANDLE (MISCELLANEOUS) ×2 IMPLANT
CUFF TOURN SGL QUICK 18X4 (TOURNIQUET CUFF) IMPLANT
CUFF TOURN SGL QUICK 24 (TOURNIQUET CUFF) ×2
CUFF TOURN SGL QUICK 34 (TOURNIQUET CUFF)
CUFF TOURN SGL QUICK 42 (TOURNIQUET CUFF) IMPLANT
CUFF TRNQT CYL 24X4X16.5-23 (TOURNIQUET CUFF) IMPLANT
CUFF TRNQT CYL 34X4.125X (TOURNIQUET CUFF) IMPLANT
DRAIN CHANNEL 19F RND (DRAIN) IMPLANT
DRAPE HALF SHEET 40X57 (DRAPES) ×2 IMPLANT
DRAPE IMP U-DRAPE 54X76 (DRAPES) ×1 IMPLANT
DRAPE INCISE IOBAN 66X45 STRL (DRAPES) ×2 IMPLANT
DRAPE ORTHO SPLIT 77X108 STRL (DRAPES) ×6
DRAPE SURG ORHT 6 SPLT 77X108 (DRAPES) ×2 IMPLANT
DRAPE U-SHAPE 47X51 STRL (DRAPES) ×2 IMPLANT
DRSG ADAPTIC 3X8 NADH LF (GAUZE/BANDAGES/DRESSINGS) ×2 IMPLANT
ELECT CAUTERY BLADE 6.4 (BLADE) ×2 IMPLANT
ELECT REM PT RETURN 9FT ADLT (ELECTROSURGICAL) ×2
ELECTRODE REM PT RTRN 9FT ADLT (ELECTROSURGICAL) ×1 IMPLANT
EVACUATOR SILICONE 100CC (DRAIN) IMPLANT
GAUZE SPONGE 4X4 12PLY STRL (GAUZE/BANDAGES/DRESSINGS) ×3 IMPLANT
GLOVE BIO SURGEON STRL SZ7.5 (GLOVE) ×2 IMPLANT
GLOVE BIOGEL PI IND STRL 8 (GLOVE) ×1 IMPLANT
GLOVE BIOGEL PI INDICATOR 8 (GLOVE) ×1
GLOVE SURG POLY ORTHO LF SZ7.5 (GLOVE) IMPLANT
GLOVE SURG SS PI 7.0 STRL IVOR (GLOVE) ×1 IMPLANT
GLOVE SURG UNDER LTX SZ8 (GLOVE) ×2 IMPLANT
GOWN STRL REUS W/ TWL LRG LVL3 (GOWN DISPOSABLE) ×3 IMPLANT
GOWN STRL REUS W/TWL LRG LVL3 (GOWN DISPOSABLE) ×6
KIT BASIN OR (CUSTOM PROCEDURE TRAY) ×2 IMPLANT
KIT TURNOVER KIT B (KITS) ×2 IMPLANT
NS IRRIG 1000ML POUR BTL (IV SOLUTION) ×2 IMPLANT
PACK GENERAL/GYN (CUSTOM PROCEDURE TRAY) ×2 IMPLANT
PAD ARMBOARD 7.5X6 YLW CONV (MISCELLANEOUS) ×4 IMPLANT
RASP HELIOCORDIAL MED (MISCELLANEOUS) IMPLANT
STAPLER VISISTAT 35W (STAPLE) ×2 IMPLANT
STOCKINETTE IMPERVIOUS LG (DRAPES) ×2 IMPLANT
SUT ETHILON 3 0 PS 1 (SUTURE) IMPLANT
SUT SILK 0 TIES 10X30 (SUTURE) ×2 IMPLANT
SUT SILK 2 0 (SUTURE) ×2
SUT SILK 2 0 SH CR/8 (SUTURE) ×2 IMPLANT
SUT SILK 2-0 18XBRD TIE 12 (SUTURE) ×1 IMPLANT
SUT SILK 3 0 (SUTURE) ×2
SUT SILK 3-0 18XBRD TIE 12 (SUTURE) ×1 IMPLANT
SUT VIC AB 2-0 CT1 18 (SUTURE) ×3 IMPLANT
TOWEL GREEN STERILE (TOWEL DISPOSABLE) ×4 IMPLANT
TOWEL GREEN STERILE FF (TOWEL DISPOSABLE) ×2 IMPLANT
UNDERPAD 30X36 HEAVY ABSORB (UNDERPADS AND DIAPERS) ×2 IMPLANT
WATER STERILE IRR 1000ML POUR (IV SOLUTION) ×2 IMPLANT

## 2021-08-06 NOTE — ED Notes (Signed)
Dressing applied on left leg webbing edema/cellulitis per vascular surgery.

## 2021-08-06 NOTE — Progress Notes (Signed)
Inpatient Rehab Admissions Coordinator:  Consult received. Await therapy evaluations and recommendations to help determine appropriateness of CIR. Will continue to follow.   Gayland Curry, [place], [place] Admissions Coordinator [phone]

## 2021-08-06 NOTE — Op Note (Signed)
    NAME: Roger Solis    MRN: [id number] DOB: [birth year]    DATE OF OPERATION: 08/06/2021  PREOP DIAGNOSIS:    Ischemic left leg  POSTOP DIAGNOSIS:    Same  PROCEDURE:    Left above-the-knee amputation  SURGEON: Judeth Cornfield. Scot Dock, MD  ASSIST: Karoline Caldwell, PA  ANESTHESIA: General  EBL: Minimal  INDICATIONS:    Roger Solis is a 66 y.o. male with severe multilevel arterial occlusive disease with no options for revascularization.  He presented with an ischemic left foot and intolerable pain.  He presents for above-the-knee amputation.  FINDINGS:   All of the muscle contracted.  Some of the muscle along the anterior lateral thigh appeared slightly ischemic.  TECHNIQUE:   The patient was taken to the operating room and received a general anesthetic.  The left leg was prepped and draped in usual sterile fashion.  A tourniquet had been placed on the upper thigh.  A fishmouth incision was marked approximately 10 to 15 cm above the patella.  The leg was exsanguinated with an Esmarch bandage and the tourniquet inflated to 300 mmHg.  Under tourniquet control the incision was carried down to the skin subcutaneous tissue muscle and fascia to the femur which was dissected free circumferentially.  The periosteum was elevated and the bone divided proximal to the level of skin division.  The leg was removed.  The femoral artery and vein were individually suture-ligated with 2-0 silk ties.  The tourniquet was then released.  Additional hemostasis was obtained using electrocautery and 2-0 silk ties.  The edges of the bone were rasped.  The wound was irrigated with copious amounts of saline.  I closed some muscle and fascia over the end of the femur.  The fascial layer was then closed with interrupted 2-0 Vicryl's.  The skin was closed with staples.  Sterile dressing was applied.  Patient tolerated the procedure well was transferred to the recovery room in stable condition.  All needle and  sponge counts were correct.  Given the complexity of the case a first assistant was necessary in order to expedient the procedure and safely perform the technical aspects of the operation.   Deitra Mayo, MD, FACS [facility]  DATE OF DICTATION:   08/06/2021

## 2021-08-06 NOTE — Anesthesia Postprocedure Evaluation (Signed)
Anesthesia Post Note  Patient: Roger Solis  Procedure(s) Performed: LEFTAMPUTATION ABOVE KNEE (Left: Knee)     Patient location during evaluation: PACU Anesthesia Type: General Level of consciousness: awake Pain management: pain level controlled Vital Signs Assessment: post-procedure vital signs reviewed and stable Respiratory status: spontaneous breathing, nonlabored ventilation, respiratory function stable and patient connected to nasal cannula oxygen Cardiovascular status: blood pressure returned to baseline and stable Postop Assessment: no apparent nausea or vomiting Anesthetic complications: no   No notable events documented.  Last Vitals:  Vitals:   08/06/21 1157 08/06/21 1520  BP: (!) 167/79 (!) 141/93  Pulse: 74 67  Resp: 17 (!) 21  Temp: 36.9 C 37.1 C  SpO2: 94% 94%    Last Pain:  Vitals:   08/06/21 1520  TempSrc: Oral  PainSc:                  Candies Palm P Fleur Audino

## 2021-08-06 NOTE — ED Notes (Signed)
Patient complaints of nausea at this time

## 2021-08-06 NOTE — Plan of Care (Signed)
  Problem: Education: Goal: Knowledge of the prescribed therapeutic regimen will improve Outcome: Progressing Goal: Ability to verbalize activity precautions or restrictions will improve Outcome: Progressing Goal: Understanding of discharge needs will improve Outcome: Progressing   Problem: Activity: Goal: Ability to perform//tolerate increased activity and mobilize with assistive devices will improve Outcome: Progressing   Problem: Clinical Measurements: Goal: Postoperative complications will be avoided or minimized Outcome: Progressing   Problem: Self-Care: Goal: Ability to meet self-care needs will improve Outcome: Progressing   Problem: Self-Concept: Goal: Ability to maintain and perform role responsibilities to the fullest extent possible will improve Outcome: Progressing   Problem: Pain Management: Goal: Pain level will decrease with appropriate interventions Outcome: Progressing   Problem: Skin Integrity: Goal: Demonstration of wound healing without infection will improve Outcome: Progressing   Problem: Education: Goal: Knowledge of the prescribed therapeutic regimen will improve Outcome: Progressing Goal: Ability to verbalize activity precautions or restrictions will improve Outcome: Progressing Goal: Understanding of discharge needs will improve Outcome: Progressing   Problem: Activity: Goal: Ability to perform//tolerate increased activity and mobilize with assistive devices will improve Outcome: Progressing   Problem: Clinical Measurements: Goal: Postoperative complications will be avoided or minimized Outcome: Progressing   Problem: Self-Care: Goal: Ability to meet self-care needs will improve Outcome: Progressing   Problem: Self-Concept: Goal: Ability to maintain and perform role responsibilities to the fullest extent possible will improve Outcome: Progressing   Problem: Pain Management: Goal: Pain level will decrease with appropriate  interventions Outcome: Progressing   Problem: Skin Integrity: Goal: Demonstration of wound healing without infection will improve Outcome: Progressing

## 2021-08-06 NOTE — Transfer of Care (Addendum)
Immediate Anesthesia Transfer of Care Note  Patient: Roger Solis  Procedure(s) Performed: LEFTAMPUTATION ABOVE KNEE (Left: Knee)  Patient Location: PACU  Anesthesia Type:General  Level of Consciousness: awake, alert  and oriented  Airway & Oxygen Therapy: Patient Spontanous Breathing  Post-op Assessment: Report given to RN and Post -op Vital signs reviewed and stable  Post vital signs: Reviewed and stable  Last Vitals:  Vitals Value Taken Time  BP 153/80 08/06/21 0851  Temp 36.6 C 08/06/21 0851  Pulse 71 08/06/21 0852  Resp 14 08/06/21 0852  SpO2 96 % 08/06/21 0852  Vitals shown include unvalidated device data.  Last Pain:  Vitals:   08/06/21 0704  TempSrc: Oral  PainSc:          Complications: No notable events documented.

## 2021-08-06 NOTE — Anesthesia Procedure Notes (Signed)
Procedure Name: Intubation Date/Time: 08/06/2021 7:43 AM Performed by: Griffin Dakin, CRNA Pre-anesthesia Checklist: Patient identified, Emergency Drugs available, Suction available and Patient being monitored Patient Re-evaluated:Patient Re-evaluated prior to induction Oxygen Delivery Method: Circle system utilized Preoxygenation: Pre-oxygenation with 100% oxygen Induction Type: IV induction Ventilation: Mask ventilation without difficulty and Oral airway inserted - appropriate to patient size Laryngoscope Size: Mac and 4 Grade View: Grade I Tube type: Oral Tube size: 7.5 mm Number of attempts: 1 Airway Equipment and Method: Stylet and Oral airway Placement Confirmation: ETT inserted through vocal cords under direct vision, positive ETCO2 and breath sounds checked- equal and bilateral Secured at: 23 cm Tube secured with: Tape Dental Injury: Teeth and Oropharynx as per pre-operative assessment

## 2021-08-06 NOTE — Progress Notes (Signed)
PROGRESS NOTE    Roger Solis  IPJ:[id number] DOB: [birth year] DOA: 08/05/2021 PCP: [facility]   Brief Narrative:   Roger Solis is a 66 y.o. 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   He was recently hospitalized him March with acute onset left-sided weakness of his arm, leg and face with CT head showing vasogenic edema and MRI concerning for tumor or brain abscess.  He was treated with IV dexamethasone and home Keppra with improvement in his weakness.  He had stereotactic biopsy of the right frontal lobe lesion which later was found to be radiation therapy induced brain necrosis.   Patient has been following with oncology Dr. Mickeal Skinner for worsening left leg weakness, numbness and pain with activity.  Oncology resumed him on Decadron 4 mg daily and patient had reported improvement in symptoms.   Has also been following with vascular surgery with recent CT angio showing multiple areas of hemodynamically significant vascular disease.  Had CT angio in April showing multilevel severe stenosis to bilateral lower extremity.   Family noticed discoloration of the left heel with a blister that started to weep and eventually ruptured.  Patient having severe pain and discomfort.       In the ED, he was afebrile mildly hypertensive, tachycardic with leukocytosis of 19.6 K.  BMP is unremarkable.  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  All of which are chronic. -Narrowing of the infrarenal abdominal aorta by 50%. -  50% narrowing of the right renal artery.  Severe narrowing of the left renal artery. -There is also distal right external iliac and common femoral artery stenosis to 60%.  Hemodynamically significant narrowing of the right popliteal artery.  Patent right femoral artery stent. -Has occluded left common iliac artery with near occlusion of the left external iliac artery.  Occluded left femoral artery and narrowing of left popliteal artery. -Dr. 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  Management per vascular surgery.  Continue antibiotics for now.  History of CVA (cerebrovascular accident) Patient no longer on aspirin for unclear reasons.  Although suspect could be related to his metastatic brain disease.   HTN (hypertension): Does not appear to be on any medications PTA.  Currently blood pressure elevated, likely secondary to pain.  Will place on as needed hydralazine.   CAD (coronary artery disease) S/p CABG, PCI s/p DES -Asymptomatic -Continue atorvastatin.  Unclear why patient stopped aspirin though suspect could be related to his brain metastasis.   History of seizure Last admitted in March 2023 for seizure.  Continue home [place].   Lung cancer metastatic to brain [facility]) Follows with Dr. Mickeal Skinner with oncology.  Currently on tapering Decadron for left sided weakness due to vasogenic edema.  Continue home Decadron.    DVT prophylaxis: SCD's Start: 08/06/21 1226   Code  Status: Full Code  Family Communication: 2 daughters present at bedside.  Plan of care discussed with the whole family and patient.    Status is: Inpatient Remains inpatient appropriate because: Underwent left AKA today   Estimated body mass index is 23.23 kg/m as  calculated from the following:   Height as of this encounter: 6' 0.99" (1.854 m).   Weight as of this encounter: 79.8 kg.    Nutritional Assessment: Body mass index is 23.23 kg/m.Marland Kitchen Seen by dietician.  I agree with the assessment and plan as outlined below: Nutrition Status:        . Skin Assessment: I have examined the patient's skin and I agree with the wound assessment as performed by the wound care RN as outlined below:    Consultants:  Vascular surgery  Procedures:  Left AKA 08/06/2021  Antimicrobials:  Anti-infectives (From admission, onward)    Start     Dose/Rate Route Frequency Ordered Stop   08/06/21 1000  vancomycin (VANCOREADY) IVPB 1250 mg/250 mL        1,250 mg 166.7 mL/hr over 90 Minutes Intravenous Every 12 hours 08/05/21 2126     08/06/21 0345  piperacillin-tazobactam (ZOSYN) IVPB 3.375 g       See Hyperspace for full Linked Orders Report.   3.375 g 12.5 mL/hr over 240 Minutes Intravenous Every 8 hours 08/05/21 1943     08/05/21 1945  vancomycin (VANCOREADY) IVPB 1750 mg/350 mL        1,750 mg 175 mL/hr over 120 Minutes Intravenous  Once 08/05/21 1943 08/06/21 0050   08/05/21 1945  piperacillin-tazobactam (ZOSYN) IVPB 3.375 g       See Hyperspace for full Linked Orders Report.   3.375 g 100 mL/hr over 30 Minutes Intravenous  Once 08/05/21 1943 08/05/21 2129         Subjective: Patient seen and examined post surgery.  2 daughters at bedside.  Patient complained of pain but he appeared very comfortable and he was making jokes, great sense of humor.  Objective: Vitals:   08/06/21 1021 08/06/21 1051 08/06/21 1121 08/06/21 1157  BP: (!) 183/78 (!) 161/65 (!) 159/68 (!) 167/79  Pulse: 72 74 76 74  Resp: 17 18 13 17   Temp:   (!) 97.5 F (36.4 C) 98.5 F (36.9 C)  TempSrc:    Oral  SpO2: 96% 96% 95% 94%  Weight:      Height:        Intake/Output Summary (Last 24 hours) at 08/06/2021 1315 Last data filed at 08/06/2021 0050 Gross per 24 hour   Intake 1401.17 ml  Output --  Net 1401.17 ml   Filed Weights   08/05/21 1758 08/06/21 0704  Weight: 79.8 kg 79.8 kg    Examination:  General exam: Appears calm and comfortable  Respiratory system: Clear to auscultation. Respiratory effort normal. Cardiovascular system: S1 & S2 heard, RRR. No JVD, murmurs, rubs, gallops or clicks. No pedal edema. Gastrointestinal system: Abdomen is nondistended, soft and nontender. No organomegaly or masses felt. Normal bowel sounds heard. Central nervous system: Alert and oriented. No focal neurological deficits. Extremities: Left AKA Skin: No rashes, lesions or ulcers Psychiatry: Judgement and insight appear normal. Mood & affect appropriate.    Data Reviewed: I have personally reviewed following labs and imaging studies  CBC: Recent Labs  Lab 08/05/21 1802 08/06/21 0316  WBC 19.6* 15.9*  NEUTROABS 17.6*  --   HGB 12.3* 11.1*  HCT 37.6* 33.7*  MCV 94.7 93.9  PLT 222  010   Basic Metabolic Panel: Recent Labs  Lab 08/05/21 1802  NA 135  K 4.0  CL 101  CO2 23  GLUCOSE 149*  BUN 20  CREATININE 0.67  CALCIUM 8.9   GFR: Estimated Creatinine Clearance: 103.9 mL/min (by C-G formula based on SCr of 0.67 mg/dL). Liver Function Tests: Recent Labs  Lab 08/05/21 1802  AST 24  ALT 21  ALKPHOS 63  BILITOT 0.4  PROT 6.4*  ALBUMIN 2.8*   No results for input(s): LIPASE, AMYLASE in the last 168 hours. No results for input(s): AMMONIA in the last 168 hours. Coagulation Profile: No results for input(s): INR, PROTIME in the last 168 hours. Cardiac Enzymes: No results for input(s): CKTOTAL, CKMB, CKMBINDEX, TROPONINI in the last 168 hours. BNP (last 3 results) No results for input(s): PROBNP in the last 8760 hours. HbA1C: No results for input(s): HGBA1C in the last 72 hours. CBG: No results for input(s): GLUCAP in the last 168 hours. Lipid Profile: No results for input(s): CHOL, HDL, LDLCALC, TRIG, CHOLHDL, LDLDIRECT in the last  72 hours. Thyroid Function Tests: No results for input(s): TSH, T4TOTAL, FREET4, T3FREE, THYROIDAB in the last 72 hours. Anemia Panel: No results for input(s): VITAMINB12, FOLATE, FERRITIN, TIBC, IRON, RETICCTPCT in the last 72 hours. Sepsis Labs: Recent Labs  Lab 08/05/21 1802  LATICACIDVEN 1.6    Recent Results (from the past 240 hour(s))  Culture, blood (routine x 2)     Status: None (Preliminary result)   Collection Time: 08/05/21  7:56 PM   Specimen: BLOOD LEFT FOREARM  Result Value Ref Range Status   Specimen Description BLOOD LEFT FOREARM  Final   Special Requests   Final    BOTTLES DRAWN AEROBIC AND ANAEROBIC Blood Culture adequate volume   Culture   Final    NO GROWTH < 12 HOURS Performed at [facility], [place] [address], [place], [place] [address]    Report Status PENDING  Incomplete  Culture, blood (routine x 2)     Status: None (Preliminary result)   Collection Time: 08/05/21  8:05 PM   Specimen: Left Antecubital; Blood  Result Value Ref Range Status   Specimen Description LEFT ANTECUBITAL  Final   Special Requests   Final    BOTTLES DRAWN AEROBIC AND ANAEROBIC Blood Culture adequate volume   Culture   Final    NO GROWTH < 12 HOURS Performed at [facility], [place] [address], [place], [place] [address]    Report Status PENDING  Incomplete  Surgical pcr screen     Status: None   Collection Time: 08/06/21  7:30 AM   Specimen: Nasal Mucosa; Nasal Swab  Result Value Ref Range Status   MRSA, PCR NEGATIVE NEGATIVE Final   Staphylococcus aureus NEGATIVE NEGATIVE Final    Comment: (NOTE) The Xpert SA Assay (FDA approved for NASAL specimens in patients 73 years of age and older), is one component of a comprehensive surveillance program. It is not intended to diagnose infection nor to guide or monitor treatment. Performed at [facility], [place] [address], [place], [place] [address]      Radiology Studies: No results found.  Scheduled Meds:   acetaminophen  500 mg Oral Q6H   atorvastatin  80 mg Oral Daily   chlorhexidine       [START ON 08/07/2021] dexamethasone  1 mg Oral Daily   dexamethasone  2 mg Oral Daily   [START ON 08/07/2021] docusate sodium  100 mg Oral  Daily   gabapentin  100 mg Oral TID   hydrALAZINE       levETIRAcetam  500 mg Oral BID   nicotine  21 mg Transdermal Daily   ondansetron       pantoprazole  40 mg Oral Daily   Continuous Infusions:  magnesium sulfate bolus IVPB     piperacillin-tazobactam (ZOSYN)  IV 3.375 g (08/06/21 0345)   vancomycin       LOS: 1 day   Darliss Cheney, MD [facility]  08/06/2021, 1:15 PM   *Please note that this is a verbal dictation therefore any spelling or grammatical errors are due to the "[employer]" system interpretation.  Please page via [place] and do not message via secure chat for urgent patient care matters. Secure chat can be used for non urgent patient care matters.  How to contact the [facility] Attending or Consulting provider [place] or covering provider during after hours [place], for this patient?  Check the care team in [facility] and look for a) attending/consulting [facility] provider listed and b) the [facility] team listed. Page or secure chat [phone]. Log into [email or web address] and use [place]'s universal password to access. If you do not have the password, please contact the hospital operator. Locate the [facility] provider you are looking for under [facility] and page to a number that you can be directly reached. If you still have difficulty reaching the provider, please page the [facility] (Director on Call) for the Hospitalists listed on amion for assistance.

## 2021-08-06 NOTE — Anesthesia Preprocedure Evaluation (Addendum)
Anesthesia Evaluation  Patient identified by MRN, date of birth, ID band Patient awake    Reviewed: Allergy & Precautions, NPO status , Patient's Chart, lab work & pertinent test results  Airway Mallampati: II  TM Distance: >3 FB Neck ROM: Full    Dental  (+) Edentulous Upper, Edentulous Lower   Pulmonary Current Smoker and Patient abstained from smoking.,  Lung Cancer    Pulmonary exam normal        Cardiovascular hypertension, + CAD, + Cardiac Stents, + CABG and + Peripheral Vascular Disease  Normal cardiovascular exam  ECG: ST, rate 111   Neuro/Psych Seizures -,  Brain mets TIACVA negative psych ROS   GI/Hepatic negative GI ROS, Neg liver ROS,   Endo/Other  negative endocrine ROS  Renal/GU negative Renal ROS     Musculoskeletal   Abdominal   Peds  Hematology  (+) Blood dyscrasia, anemia ,   Anesthesia Other Findings ISCHEMIC LEFT LEG    Reproductive/Obstetrics                            Anesthesia Physical  Anesthesia Plan  ASA: 3  Anesthesia Plan: General   Post-op Pain Management:    Induction: Intravenous  PONV Risk Score and Plan: 1 and Ondansetron, Dexamethasone and Treatment may vary due to age or medical condition  Airway Management Planned: Oral ETT  Additional Equipment:   Intra-op Plan:   Post-operative Plan: Extubation in OR  Informed Consent: I have reviewed the patients History and Physical, chart, labs and discussed the procedure including the risks, benefits and alternatives for the proposed anesthesia with the patient or authorized representative who has indicated his/her understanding and acceptance.       Plan Discussed with: CRNA  Anesthesia Plan Comments:        Anesthesia Quick Evaluation

## 2021-08-07 ENCOUNTER — Encounter (HOSPITAL_COMMUNITY): Payer: Self-pay | Admitting: Vascular Surgery

## 2021-08-07 DIAGNOSIS — I7025 Atherosclerosis of native arteries of other extremities with ulceration: Secondary | ICD-10-CM | POA: Diagnosis not present

## 2021-08-07 DIAGNOSIS — L97509 Non-pressure chronic ulcer of other part of unspecified foot with unspecified severity: Secondary | ICD-10-CM | POA: Diagnosis not present

## 2021-08-07 LAB — CBC
HCT: 38.2 % — ABNORMAL LOW (ref 39.0–52.0)
Hemoglobin: 12.6 g/dL — ABNORMAL LOW (ref 13.0–17.0)
MCH: 30.1 pg (ref 26.0–34.0)
MCHC: 33 g/dL (ref 30.0–36.0)
MCV: 91.4 fL (ref 80.0–100.0)
Platelets: 210 10*3/uL (ref 150–400)
RBC: 4.18 MIL/uL — ABNORMAL LOW (ref 4.22–5.81)
RDW: 15.6 % — ABNORMAL HIGH (ref 11.5–15.5)
WBC: 12.7 10*3/uL — ABNORMAL HIGH (ref 4.0–10.5)
nRBC: 0 % (ref 0.0–0.2)

## 2021-08-07 LAB — BASIC METABOLIC PANEL
Anion gap: 11 (ref 5–15)
BUN: 6 mg/dL — ABNORMAL LOW (ref 8–23)
CO2: 24 mmol/L (ref 22–32)
Calcium: 8.9 mg/dL (ref 8.9–10.3)
Chloride: 99 mmol/L (ref 98–111)
Creatinine, Ser: 0.73 mg/dL (ref 0.61–1.24)
GFR, Estimated: >60 mL/min (ref >60)
Glucose, Bld: 98 mg/dL (ref 70–99)
Potassium: 4 mmol/L (ref 3.5–5.1)
Sodium: 134 mmol/L — ABNORMAL LOW (ref 135–145)

## 2021-08-07 MED ORDER — HEPARIN SODIUM (PORCINE) 5000 UNIT/ML IJ SOLN
5000.0000 [IU] | Freq: Three times a day (TID) | INTRAMUSCULAR | Status: DC
Start: 2021-08-07 — End: 2021-08-09
  Administered 2021-08-07 – 2021-08-09 (×7): 5000 [IU] via SUBCUTANEOUS
  Filled 2021-08-07 (×8): qty 1

## 2021-08-07 NOTE — Evaluation (Signed)
Physical Therapy Evaluation Patient Details Name: Roger Solis MRN: [id number] DOB: 25-Aug-1955 Today's Date: 08/07/2021  History of Present Illness  Roger Solis is a 66 y.o. 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  Clinical Impression  Patient presents with decreased mobility due to L AKA with decreased balance, decreased strength, decreased activity tolerance with pain and decreased knowledge of precautions.  Currently mod A for mobility up to recliner with RW.  He will benefit from skilled PT in the acute setting to allow return home with family support following acute inpatient rehab stay.        Recommendations for follow up therapy are one component of a multi-disciplinary discharge planning process, led by the attending physician.  Recommendations may be updated based on patient status, additional functional criteria and insurance authorization.  Follow Up Recommendations Acute inpatient rehab (3hours/day)    Assistance Recommended at Discharge Frequent or constant Supervision/Assistance  Patient can return home with the following  A lot of help with walking and/or transfers;Assist for transportation;Assistance with cooking/housework;A lot of help with bathing/dressing/bathroom;Help with stairs or ramp for entrance    Equipment Recommendations Wheelchair (measurements PT)  Recommendations for Other Services  Rehab consult    Functional Status Assessment Patient has had a recent decline in their functional status and demonstrates the ability to make significant improvements in function in a reasonable and predictable amount of time.     Precautions / Restrictions Precautions Precautions:  Fall Restrictions Weight Bearing Restrictions: Yes LLE Weight Bearing: Non weight bearing      Mobility  Bed Mobility Overal bed mobility: Needs Assistance Bed Mobility: Supine to Sit     Supine to sit: Mod assist     General bed mobility comments: scooted hips on bed pad and encouraged pt to use rails to sit up and did so with bilateral UE support on rails and close S, cues to scoot back on R due to at edge once sitting    Transfers Overall transfer level: Needs assistance Equipment used: Rolling walker (2 wheels) Transfers: Sit to/from Stand, Bed to chair/wheelchair/BSC Sit to Stand: Mod assist, From elevated surface   Step pivot transfers: Min assist, Mod assist       General transfer comment: up from higher surface with cues for hand placement some lifting help and assist for balance, step/hop to recliner with RW with min/mod A for balance, walker safety and cues    Ambulation/Gait                  Stairs            Wheelchair Mobility    Modified Rankin (Stroke Patients Only)       Balance Overall balance assessment: Needs assistance   Sitting balance-Leahy Scale: Fair Sitting balance - Comments: on edge of bed needing to scoot back on R due to too close to edge with cue and S   Standing balance support: Bilateral upper extremity supported Standing balance-Leahy Scale: Poor Standing balance comment: due to AKA on L                             Pertinent Vitals/Pain Pain Assessment Pain Assessment: 0-10 Pain Score: 7  Pain Location: L LE Pain Descriptors / Indicators: Operative site guarding, Aching Pain Intervention(s): Monitored during session, Premedicated before session    Home Living Family/patient expects to be discharged to:: Inpatient rehab Living Arrangements: Children Available Help at Discharge: Family;Available 24 hours/day (daughter)   Home Access: Stairs to enter   [employer] of Steps: 1&1    Home Layout: One level Home Equipment: Conservation officer, nature (2 wheels);Rollator (4 wheels);Shower seat      Prior Function Prior Level of Function : Needs assist       Physical Assist : ADLs (physical)     Mobility Comments: was walking with a walker ADLs Comments: daughter helped to dress, states he could get in and out of the shower     Hand Dominance   Dominant Hand: Right    Extremity/Trunk Assessment   Upper Extremity Assessment Upper Extremity Assessment: LUE deficits/detail LUE Deficits / Details: reports weaker from metastatic brain tumor; AROM WFL, strength grossly 4/5    Lower Extremity Assessment Lower Extremity Assessment: LLE deficits/detail LLE Deficits / Details: difficulty lifting leg due to pain, passively moves on his own with hip flexion but not formally tested due to pain       Communication   Communication: HOH  Cognition Arousal/Alertness: Awake/alert Behavior During Therapy: WFL for tasks assessed/performed Overall Cognitive Status: Within Functional Limits for tasks assessed                                          General Comments General comments (skin integrity, edema, etc.): L LE wrapped with ace with dried blood on medial aspect of distal end of residual limb    Exercises General Exercises - Lower Extremity Ankle Circles/Pumps: AROM, Right, 5 reps, Seated Amputee Exercises Gluteal Sets: Strengthening, Both, 5 reps, Seated   Assessment/Plan    PT Assessment Patient needs continued PT services  PT Problem List Decreased activity tolerance;Decreased strength;Decreased balance;Decreased mobility;Pain;Decreased safety awareness;Decreased knowledge of use of DME;Decreased knowledge of precautions       PT Treatment Interventions DME instruction;Balance training;Gait training;Functional mobility training;Patient/family education;Therapeutic activities;Therapeutic exercise;Wheelchair mobility training    PT Goals (Current  goals can be found in the Care Plan section)  Acute Rehab PT Goals Patient Stated Goal: to be as independent as he can before home PT Goal Formulation: With patient Time For Goal Achievement: 08/21/21 Potential to Achieve Goals: Good    Frequency Min 3X/week     Co-evaluation               AM-PAC PT "6 Clicks" Mobility  Outcome Measure Help needed turning from your back to your side while in a flat bed without using bedrails?: A Lot Help needed moving from lying on your back to sitting on the side of a flat bed without using bedrails?: A Lot Help needed moving to and from a bed to a chair (including a wheelchair)?: A Lot Help needed standing up from a chair using your arms (e.g., wheelchair or bedside chair)?: A Lot Help needed to walk in hospital room?: Total Help needed climbing 3-5 steps with a railing? : Total 6 Click Score: 10    End of Session Equipment Utilized During Treatment: Gait belt Activity Tolerance: Patient limited by pain Patient left: in chair;with chair alarm set;with call bell/phone within reach   PT Visit Diagnosis: Difficulty in walking, not elsewhere classified (R26.2);Pain Pain - Right/Left: Left  Pain - part of body: Leg    Time: 1761-6073 PT Time Calculation (min) (ACUTE ONLY): 25 min   Charges:   PT Evaluation $PT Eval Moderate Complexity: 1 Mod PT Treatments $Therapeutic Activity: 8-22 mins        Magda Kiel, PT Acute Rehabilitation Services [id number]:[phone] [phone] 08/07/2021   Reginia Naas 08/07/2021, 10:21 AM

## 2021-08-07 NOTE — TOC Initial Note (Signed)
Transition of Care [facility]) - Initial/Assessment Note    Patient Details  Name: Roger Solis MRN: [id number] Date of Birth: 1955-04-05  Transition of Care [facility]) CM/SW Contact:    Vinie Sill, LCSW Phone Number: 08/07/2021, 12:14 PM  Clinical Narrative:                  CSW met with patient. CSW introduced self and explained role. Patient reports he lives in the home with his daughter. CSW discussed therapy recommendation for inpatient rehab before discharging home. Patient confirms he understands the recommendations. He states " I will go where ever the [place] says". Patient is agreeable to short term rehab at SNF as back up plan. No noted questions or concerns at this time.   Thurmond Butts, MSW, LCSW Clinical Social Worker    Expected Discharge Plan: IP Rehab Facility Barriers to Discharge: Insurance Authorization   Patient Goals and CMS Choice        Expected Discharge Plan and Services Expected Discharge Plan: [place] arrangements for the past 2 months: [place]                                      Prior Living Arrangements/Services Living arrangements for the past 2 months: Single Family Home Lives with:: Self, Adult Children Patient language and need for interpreter reviewed:: No        Need for Family Participation in Patient Care: Yes (Comment) Care giver support system in place?: Yes (comment)   Criminal Activity/Legal Involvement Pertinent to Current Situation/Hospitalization: No - Comment as needed  Activities of Daily Living Home Assistive Devices/Equipment: None ADL Screening (condition at time of admission) Patient's cognitive ability adequate to safely complete daily activities?: Yes Is the patient deaf or have difficulty hearing?: No Does the patient have difficulty seeing, even when wearing glasses/contacts?: No Does the patient have difficulty concentrating, remembering, or making decisions?: No Patient  able to express need for assistance with ADLs?: Yes Does the patient have difficulty dressing or bathing?: No Independently performs ADLs?: Yes (appropriate for developmental age) Does the patient have difficulty walking or climbing stairs?: Yes Weakness of Legs: Left Weakness of Arms/Hands: None  Permission Sought/Granted Permission sought to share information with : Family Supports Permission granted to share information with : Yes, Verbal Permission Granted  Share Information with NAME: Nichola Sizer           Emotional Assessment Appearance:: Appears stated age Attitude/Demeanor/Rapport: Engaged Affect (typically observed): Pleasant, Accepting Orientation: : Oriented to Self, Oriented to Place, Oriented to  Time, Oriented to Situation Alcohol / Substance Use: Not Applicable Psych Involvement: No (comment)  Admission diagnosis:  Cellulitis of left foot [L03.116] Ischemic foot [I99.8] 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 ([place]) [I70.25, L97.509] Patient Active Problem List   Diagnosis Date Noted   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 ([address]), [place] [address] [place] ([place]) [place] [address] Phone: [phone] Fax: [place], [place] - [address] [place]  [place] [address] Phone: [phone] Fax: [phone]     Social Determinants of Health (SDOH) Interventions    Readmission Risk Interventions     View : No data to display.

## 2021-08-07 NOTE — Progress Notes (Signed)
  Transition of Care [facility]) Screening Note   Patient Details  Name: Verne Lanuza Date of Birth: 12-Feb-1956   Transition of Care [facility]) CM/SW Contact:    Vinie Sill, LCSW Phone Number: 08/07/2021, 8:58 AM    Transition of Care Department [facility]) has reviewed patient and no TOC needs have been identified at this time. We will continue to monitor patient advancement through interdisciplinary progression rounds. If new patient transition needs arise, please place a TOC consult.

## 2021-08-07 NOTE — Progress Notes (Addendum)
Inpatient Rehabilitation Admissions Coordinator   I met at bedside with patient for rehab assessment. I discussed goals and expectations of a possible Cir admit.He is in agreement. He moved from [place] , [place]. Feb 2023 to be with his daughter who can provide assistance. I await OT eval and then can begin Auth with [facility] for  a possible CIR admit.  Danne Baxter, RN, MSN Rehab Admissions Coordinator [phone] 08/07/2021 11:56 AM   I met with patient, daughter and granddaughter to review CIR goals. They prefer Cir. Daughter is requesting information on advanced directives and health Care POA. I will ask acute team to arrange for Chaplain service to provide information.  Danne Baxter, RN, MSN Rehab Admissions Coordinator [phone] 08/07/2021 12:15 PM

## 2021-08-07 NOTE — PMR Pre-admission (Signed)
PMR Admission Coordinator Pre-Admission Assessment  Patient: Roger Solis is an 66 y.o., male MRN: [id number] DOB: 1956/02/22 Height: 6' 0.99" (185.4 cm) Weight: 79.8 kg  Insurance Information HMO:     PPO:      PCP:      IPA:      80/20:      OTHER:  PRIMARY: [place]      Policy#: [id number]      Subscriber: pt CM Name: Sherlynn Carbon      Phone#: [phone]     Fax#: [phone] Pre-Cert#: [id number]      Employer:  Benefits:  Phone #: [phone]     Name: 6/5 Eff. Date: 07/2017     Deduct:       Out of Pocket Max:       Life Max:  CIR: per VA guidelines      SNF: per VA Outpatient: per VA    Co-Pay:  Home Health: per VA      Co-Pay:  DME: per VA     Co-Pay:  Providers: in network  SECONDARY:       Policy#:      Phone#:   Development worker, community:       Phone#:   The Engineer, petroleum" for patients in Inpatient Rehabilitation Facilities with attached "Privacy Act [place] Records" was provided and verbally reviewed with: N/A  Emergency Contact Information Contact Information     Name Relation Home Work [place], [place] Daughter   [phone]      Current Medical History  Patient Admitting Diagnosis: AKA  History of Present Illness:  66 year old right-handed male with history of CAD/CABG, hyperlipidemia, tobacco abuse, PAD status post right femoral artery stent with chronic lower extremity ischemic changes, 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 Ellene Route and followed by Dr. 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  Presented 08/05/2021 after recent angiogram showed severe multilevel arterial occlusive disease of the left lower extremity as well as gangrenous changes.  Limb was not felt to be salvageable.  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 Deitra Mayo.  Hospital course pain management.  Subcutaneous heparin added for DVT prophylaxis.    Patient[facility] record from [facility] has been reviewed by the rehabilitation admission coordinator and physician.  Past Medical History  Past Medical History:  Diagnosis Date   CAD (coronary artery disease)    History of brain cancer    HLD (hyperlipidemia)    Hypertension    PAD (peripheral artery disease) (HCC)    Has the patient had major surgery during 100 days prior to admission? Yes  Family History   family history includes Heart disease in his father and mother.  Current Medications  Current Facility-Administered Medications:    acetaminophen (TYLENOL) tablet 325-650 mg, 325-650 mg, Oral, Q6H PRN, Baglia, Corrina, PA-C   albuterol (PROVENTIL) (2.5 MG/3ML) 0.083% nebulizer solution 3 mL, 3 mL, Inhalation, Q6H PRN, Baglia, Corrina, PA-C   alum & mag hydroxide-simeth (MAALOX/MYLANTA) 200-200-20 MG/5ML suspension 15-30 mL, 15-30 mL, Oral, Q2H PRN, Baglia, Corrina, PA-C   atorvastatin (LIPITOR) tablet 80 mg, 80 mg, Oral, Daily, Baglia, Corrina, PA-C, 80 mg at 08/09/21 0804   dexamethasone (DECADRON) tablet 1 mg, 1 mg, Oral, Daily, Baglia, Corrina, PA-C, 1 mg at 08/09/21 0932   docusate sodium (COLACE) capsule 100 mg, 100 mg,  Oral, Daily, Baglia, Corrina, PA-C, 100 mg at 08/09/21 0803   gabapentin (NEURONTIN) capsule 100 mg, 100 mg, Oral, TID, Baglia, Corrina, PA-C, 100 mg at 08/09/21 0804   guaiFENesin-dextromethorphan (ROBITUSSIN DM) 100-10 MG/5ML syrup 15 mL, 15 mL, Oral, Q4H PRN, Baglia, Corrina, PA-C   heparin injection 5,000 Units, 5,000 Units, Subcutaneous, Q8H, Angelia Mould, MD, 5,000 Units at 08/09/21 0536   hydrALAZINE (APRESOLINE) injection 10 mg, 10 mg, Intravenous, Q20 Min PRN, Darliss Cheney, MD, 10 mg at 08/07/21 0340   HYDROcodone-acetaminophen (NORCO) 7.5-325 MG per tablet 1-2 tablet, 1-2 tablet, Oral, Q4H PRN, Baglia, Corrina, PA-C, 2 tablet at  08/09/21 0932   HYDROcodone-acetaminophen (NORCO/VICODIN) 5-325 MG per tablet 1-2 tablet, 1-2 tablet, Oral, Q4H PRN, Baglia, Corrina, PA-C, 2 tablet at 08/08/21 1338   labetalol (NORMODYNE) injection 10 mg, 10 mg, Intravenous, Q10 min PRN, Baglia, Corrina, PA-C   levETIRAcetam (KEPPRA) tablet 500 mg, 500 mg, Oral, BID, Baglia, Corrina, PA-C, 500 mg at 08/09/21 0804   magnesium sulfate IVPB 2 g 50 mL, 2 g, Intravenous, Daily PRN, Baglia, Corrina, PA-C   metoprolol tartrate (LOPRESSOR) injection 2-5 mg, 2-5 mg, Intravenous, Q2H PRN, Baglia, Corrina, PA-C   morphine (PF) 2 MG/ML injection 0.5-1 mg, 0.5-1 mg, Intravenous, Q2H PRN, Baglia, Corrina, PA-C, 1 mg at 08/09/21 0800   nicotine (NICODERM CQ - dosed in mg/24 hours) patch 21 mg, 21 mg, Transdermal, Daily, Pahwani, Ravi, MD, 21 mg at 08/09/21 0805   ondansetron (ZOFRAN) injection 4 mg, 4 mg, Intravenous, Q6H PRN, Baglia, Corrina, PA-C, 4 mg at 08/06/21 0340   pantoprazole (PROTONIX) EC tablet 40 mg, 40 mg, Oral, Daily, Baglia, Corrina, PA-C, 40 mg at 08/09/21 0803   phenol (CHLORASEPTIC) mouth spray 1 spray, 1 spray, Mouth/Throat, PRN, Baglia, Corrina, PA-C   potassium chloride SA (KLOR-CON M) CR tablet 20-40 mEq, 20-40 mEq, Oral, Daily PRN, Baglia, Corrina, PA-C   senna-docusate (Senokot-S) tablet 1 tablet, 1 tablet, Oral, QHS PRN, Baglia, Corrina, PA-C, 1 tablet at 08/07/21 2033  Patients Current Diet:  Diet Order             Diet regular Room service appropriate? Yes; Fluid consistency: Thin  Diet effective now                  Precautions / Restrictions Precautions Precautions: Fall Restrictions Weight Bearing Restrictions: Yes LLE Weight Bearing: Non weight bearing   Has the patient had 2 or more falls or a fall with injury in the past year? No  Prior Activity Level Community (5-7x/wk): Mod I with RW, some assist with adls  Prior Functional Level Self Care: Did the patient need help bathing, dressing, using the toilet or  eating? Needed some help  Indoor Mobility: Did the patient need assistance with walking from room to room (with or without device)? Independent  Stairs: Did the patient need assistance with internal or external stairs (with or without device)? Independent  Functional Cognition: Did the patient need help planning regular tasks such as shopping or remembering to take medications? Independent  Patient Information Are you of Hispanic, Latino/a,or Spanish origin?: A. No, not of Hispanic, Latino/a, or Spanish origin What is your race?: A. White Do you need or want an interpreter to communicate with a doctor or health care staff?: 0. No  Patient's Response To:  Health Literacy and Transportation Is the patient able to respond to health literacy and transportation needs?: Yes Health Literacy - How often do you need to have someone help you when  you read instructions, pamphlets, or other written material from your doctor or pharmacy?: Never In the past 12 months, has lack of transportation kept you from medical appointments or from getting medications?: No In the past 12 months, has lack of transportation kept you from meetings, work, or from getting things needed for daily living?: No  Home Assistive Devices / [place] Devices/Equipment: None Home Equipment: Conservation officer, nature (2 wheels), Rollator (4 wheels), Shower seat  Prior Device Use: Indicate devices/aids used by the patient prior to current illness, exacerbation or injury? RW  Current Functional Level Cognition  Overall Cognitive Status: Within Functional Limits for tasks assessed Orientation Level: Oriented X4    Extremity Assessment (includes Sensation/Coordination)  Upper Extremity Assessment: LUE deficits/detail LUE Deficits / Details: reports weaker from metastatic brain tumor; AROM WFL, strength grossly 4/5  Lower Extremity Assessment: Defer to PT evaluation LLE Deficits / Details: difficulty lifting leg due to  pain, passively moves on his own with hip flexion but not formally tested due to pain    ADLs  Overall ADL's : Needs assistance/impaired Eating/Feeding: Independent, Sitting Grooming: Set up, Sitting Upper Body Bathing: Set up, Sitting Lower Body Bathing: Moderate assistance, Sit to/from stand Upper Body Dressing : Set up, Sitting Lower Body Dressing: Moderate assistance, Sit to/from stand Toilet Transfer: Minimal assistance, Ambulation, Rolling walker (2 wheels) Toilet Transfer Details (indicate cue type and reason): step hop with RW Toileting- Clothing Manipulation and Hygiene: Min guard, Sitting/lateral lean Functional mobility during ADLs: Minimal assistance, Rolling walker (2 wheels) General ADL Comments: min A for position changes, increased time and effort for all movement    Mobility  Overal bed mobility: Needs Assistance Bed Mobility: Sit to Supine Supine to sit: Mod assist Sit to supine: Min guard General bed mobility comments: with HOB significantly  elevated    Transfers  Overall transfer level: Needs assistance Equipment used: Rolling walker (2 wheels) Transfers: Sit to/from Stand, Bed to chair/wheelchair/BSC Sit to Stand: Min assist Bed to/from chair/wheelchair/BSC transfer type:: Step pivot Step pivot transfers: Min assist General transfer comment: up from higher surface with cues for hand placement some lifting help and assist for balance, step/hop to recliner with RW with min/mod A for balance, walker safety and cues    Ambulation / Gait / Stairs / Wheelchair Mobility       Posture / Balance Dynamic Sitting Balance Sitting balance - Comments: on edge of bed needing to scoot back on R due to too close to edge with cue and S Balance Overall balance assessment: Needs assistance Sitting balance-Leahy Scale: Fair Sitting balance - Comments: on edge of bed needing to scoot back on R due to too close to edge with cue and S Standing balance support: Bilateral upper  extremity supported Standing balance-Leahy Scale: Poor Standing balance comment: due to AKA on L    Special needs/care consideration Patient and daughter requested on acute assistance with advanced directives and health Care POA completion on 6/5   Previous Home Environment  Living Arrangements: Children  Lives With: Daughter Available Help at Discharge: Family, Available 24 hours/day Type of Home: House Home Layout: One level Home Access: Stairs to enter [employer] of Steps: 1&1 Bathroom Shower/Tub: Government social research officer Accessibility: No Home Care Services: No Additional Comments: Moved form [place] , [place] in Feb 2023 to live with daughter  Discharge Living Setting Plans for Discharge Living Setting: Lives with (comment) (daughter) Type of Home at Discharge: House Discharge Home Layout: One level Discharge  Home Access: Stairs to enter Entrance Stairs-Rails: None Entrance Stairs-Number of Steps: 1 and 1 Discharge Bathroom Shower/Tub: Tub/shower unit Discharge Bathroom Toilet: Standard Discharge Bathroom Accessibility: No Does the patient have any problems obtaining your medications?: No  Social/Family/Support Systems Patient Roles: Manufacturing systems engineer Information: daughter, [name] Anticipated Caregiver: daughter Anticipated Caregiver's Contact Information: see contacts Ability/Limitations of Caregiver: none Caregiver Availability: 24/7 Discharge Plan Discussed with Primary Caregiver: Yes Is Caregiver In Agreement with Plan?: Yes Does Caregiver/Family have Issues with Lodging/Transportation while Pt is in Rehab?: No  Goals Patient/Family Goal for Rehab: Mod I to intermittent supervision with PT and OT at wheelchair level Expected length of stay: ELOS 10 to 12 days Pt/Family Agrees to Admission and willing to participate: Yes Program Orientation Provided & Reviewed with Pt/Caregiver Including Roles  & Responsibilities: Yes  Decrease  burden of Care through IP rehab admission: n/a  Possible need for SNF placement upon discharge: not anticipated  Patient Condition: I have reviewed medical records from [facility], spoken with CM, and patient and daughter. I met with patient at the bedside for inpatient rehabilitation assessment.  Patient will benefit from ongoing PT and OT, can actively participate in 3 hours of therapy a day 5 days of the week, and can make measurable gains during the admission.  Patient will also benefit from the coordinated team approach during an Inpatient Acute Rehabilitation admission.  The patient will receive intensive therapy as well as Rehabilitation physician, nursing, social worker, and care management interventions.  Due to bladder management, bowel management, safety, skin/wound care, disease management, medication administration, pain management, and patient education the patient requires 24 hour a day rehabilitation nursing.  The patient is currently mod asisst overall with mobility and basic ADLs.  Discharge setting and therapy post discharge at home with home health is anticipated.  Patient has agreed to participate in the Acute Inpatient Rehabilitation Program and will admit today.  Preadmission Screen Completed By:  Cleatrice Burke, 08/09/2021 10:33 AM ______________________________________________________________________   Discussed status with Dr. Curlene Dolphin on 08/09/21 at 1034 and received approval for admission today.  Admission Coordinator:  Cleatrice Burke, RN, time 2952 Date 08/09/21   Assessment/Plan: Diagnosis: Does the need for close, 24 hr/day Medical supervision in concert with the patient's rehab needs make it unreasonable for this patient to be served in a less intensive setting? 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 Yes Does the patient require coordinated care of a physician, rehab nurse, PT, OT, and SLP to address physical and functional deficits in the context of the above medical diagnosis(es)? Yes Addressing deficits in the following areas: balance, endurance, locomotion, strength, transferring, bowel/bladder control, bathing, dressing, feeding, grooming, and toileting Can the patient actively participate in an intensive therapy program of at least 3 hrs of therapy 5 days a week? Yes The potential for patient to make measurable gains while on inpatient rehab is good Anticipated functional outcomes upon discharge from inpatient rehab: modified independent and supervision PT, modified independent and supervision OT, n/a SLP Estimated rehab length of stay to reach the above functional goals is: 10-12 Anticipated discharge destination: Home 10. Overall Rehab/Functional Prognosis: good   MD Signature: Jennye Boroughs

## 2021-08-07 NOTE — Evaluation (Signed)
Occupational Therapy Evaluation Patient Details Name: Roger Solis MRN: [id number] DOB: 1955/10/05 Today's Date: 08/07/2021   History of Present Illness Roger Solis is a 66 y.o. 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   Clinical Impression   Roger Solis was evaluated s/p the above AKA, he is generally indep at baseline and lives with his daughter who is able to assist as needed at d/c. Upon evaluation pt required min A to boost into standing from lower chair height; once standing with RW he was able to hop with min A for balance and cues. He is limited by impaired balance, poor activity tolerance, generalized weakness and L residual limb pain. Due to deficits, he requires up to mod A for LB Adls. Pt will benefit from OT acutely, recommend CIR at d/c for maximal functional recovery towards baseline.    Recommendations for follow up therapy are one component of a multi-disciplinary discharge planning process, led by the attending physician.  Recommendations may be updated based on patient status, additional functional criteria and insurance authorization.   Follow Up Recommendations  Acute inpatient rehab (3hours/day)    Assistance Recommended at Discharge Frequent or constant Supervision/Assistance  Patient can return home with the following A lot of help with walking and/or transfers;A lot of help with bathing/dressing/bathroom;Assist for transportation;Help with stairs or ramp for entrance    Functional Status Assessment  Patient has had a recent decline in their functional status and demonstrates the ability to make significant improvements in function in a reasonable and predictable amount of time.  Equipment  Recommendations  BSC/3in1;Tub/shower seat;Other (comment);Wheelchair (measurements OT);Wheelchair cushion (measurements OT) (RW)    Recommendations for Other Services Rehab consult     Precautions / Restrictions Precautions Precautions: Fall Restrictions Weight Bearing Restrictions: Yes LLE Weight Bearing: Non weight bearing      Mobility Bed Mobility Overal bed mobility: Needs Assistance Bed Mobility: Sit to Supine       Sit to supine: Min guard   General bed mobility comments: with HOB significantly  elevated    Transfers Overall transfer level: Needs assistance Equipment used: Rolling walker (2 wheels) Transfers: Sit to/from Stand, Bed to chair/wheelchair/BSC Sit to Stand: Min assist     Step pivot transfers: Min assist            Balance Overall balance assessment: Needs assistance   Sitting balance-Leahy Scale: Fair     Standing balance support: Bilateral upper extremity supported Standing balance-Leahy Scale: Poor                             ADL either performed or assessed with clinical judgement   ADL Overall ADL's : Needs assistance/impaired Eating/Feeding: Independent;Sitting   Grooming: Set up;Sitting   Upper Body Bathing: Set up;Sitting   Lower Body Bathing: Moderate assistance;Sit to/from stand   Upper Body Dressing : Set up;Sitting   Lower Body Dressing: Moderate assistance;Sit to/from stand   Toilet Transfer: Minimal assistance;Ambulation;Rolling walker (2 wheels) Toilet Transfer Details (indicate cue type and reason): step hop with RW Toileting- Clothing Manipulation and Hygiene: Min guard;Sitting/lateral lean       Functional mobility during ADLs: Minimal assistance;Rolling walker (2 wheels) General ADL Comments: min A for position  changes, increased time and effort for all movement     Vision Baseline Vision/History: 0 No visual deficits Ability to See in Adequate Light: 0 Adequate Vision Assessment?: No  apparent visual deficits     Perception     Praxis      Pertinent Vitals/Pain Pain Assessment Pain Assessment: 0-10 Pain Score: 5  Pain Location: L LE Pain Descriptors / Indicators: Operative site guarding, Aching Pain Intervention(s): Limited activity within patient's tolerance, Monitored during session     Hand Dominance Right   Extremity/Trunk Assessment Upper Extremity Assessment Upper Extremity Assessment: LUE deficits/detail LUE Deficits / Details: reports weaker from metastatic brain tumor; AROM WFL, strength grossly 4/5   Lower Extremity Assessment Lower Extremity Assessment: Defer to PT evaluation LLE Deficits / Details: difficulty lifting leg due to pain, passively moves on his own with hip flexion but not formally tested due to pain   Cervical / Trunk Assessment Cervical / Trunk Assessment: Normal   Communication Communication Communication: HOH   Cognition Arousal/Alertness: Awake/alert Behavior During Therapy: WFL for tasks assessed/performed Overall Cognitive Status: Within Functional Limits for tasks assessed                                       General Comments  skin breakdown on rear peri area, RN present and applied barrier cream in steading    Exercises     Shoulder Instructions      Home Living Family/patient expects to be discharged to:: Inpatient rehab Living Arrangements: Children Available Help at Discharge: Family;Available 24 hours/day Type of Home: House Home Access: Stairs to enter [employer] of Steps: 1&1   Home Layout: One level     Bathroom Shower/Tub: Teacher, early years/pre: Standard Bathroom Accessibility: No   Home Equipment: Conservation officer, nature (2 wheels);Rollator (4 wheels);Shower seat   Additional Comments: Moved form [place] , [place] in Feb 2023 to live with daughter  Lives With: Daughter    Prior Functioning/Environment Prior Level of Function : Needs assist       Physical  Assist : ADLs (physical)     Mobility Comments: was walking with a walker ADLs Comments: daughter helped to dress, states he could get in and out of the shower        OT Problem List: Decreased strength;Decreased range of motion;Impaired balance (sitting and/or standing);Decreased activity tolerance;Pain;Decreased knowledge of precautions      OT Treatment/Interventions: Self-care/ADL training;Therapeutic exercise;DME and/or AE instruction;Therapeutic activities;Patient/family education;Balance training    OT Goals(Current goals can be found in the care plan section) Acute Rehab OT Goals Patient Stated Goal: to get home OT Goal Formulation: With patient Time For Goal Achievement: 08/21/21 Potential to Achieve Goals: Good ADL Goals Pt Will Perform Lower Body Bathing: with supervision;sit to/from stand Pt Will Perform Lower Body Dressing: with supervision;sit to/from stand Pt Will Transfer to Toilet: with supervision;ambulating Pt Will Perform Toileting - Clothing Manipulation and hygiene: with modified independence;sitting/lateral leans  OT Frequency: Min 2X/week       AM-PAC OT "6 Clicks" Daily Activity     Outcome Measure Help from another person eating meals?: None Help from another person taking care of personal grooming?: A Little Help from another person toileting, which includes using toliet, bedpan, or urinal?: A Lot Help from another person bathing (including washing, rinsing, drying)?: A Lot Help from another person to put on and taking off regular upper body clothing?:  A Little Help from another person to put on and taking off regular lower body clothing?: A Lot 6 Click Score: 16   End of Session Equipment Utilized During Treatment: Gait belt;Rolling walker (2 wheels) Nurse Communication: Mobility status  Activity Tolerance: Patient tolerated treatment well Patient left: in bed;with call bell/phone within reach;with bed alarm set;with family/visitor present  OT  Visit Diagnosis: Unsteadiness on feet (R26.81);Other abnormalities of gait and mobility (R26.89);Muscle weakness (generalized) (M62.81);Pain                Time: 0076-2263 OT Time Calculation (min): 17 min Charges:  OT General Charges $OT Visit: 1 Visit OT Evaluation $OT Eval Moderate Complexity: 1 Mod   Marlena Barbato A Raymonda Pell 08/07/2021, 2:01 PM

## 2021-08-07 NOTE — Progress Notes (Signed)
PROGRESS NOTE    Roger Solis  ZOX:[id number] DOB: 01/23/1956 DOA: 08/05/2021 PCP: [facility]   Brief Narrative:   Meilech Virts is a 66 y.o. 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   He was recently hospitalized him March with acute onset left-sided weakness of his arm, leg and face with CT head showing vasogenic edema and MRI concerning for tumor or brain abscess.  He was treated with IV dexamethasone and home Keppra with improvement in his weakness.  He had stereotactic biopsy of the right frontal lobe lesion which later was found to be radiation therapy induced brain necrosis.   Patient has been following with oncology Dr. Mickeal Skinner for worsening left leg weakness, numbness and pain with activity.  Oncology resumed him on Decadron 4 mg daily and patient had reported improvement in symptoms.   Has also been following with vascular surgery with recent CT angio showing multiple areas of hemodynamically significant vascular disease.  Had CT angio in April showing multilevel severe stenosis to bilateral lower extremity.   Family noticed discoloration of the left heel with a blister that started to weep and eventually ruptured.  Patient having severe pain and discomfort.       In the ED, he was afebrile mildly hypertensive, tachycardic with leukocytosis of 19.6 K.  BMP is unremarkable.  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  All of which are chronic. -Narrowing of the infrarenal abdominal aorta by 50%. -  50% narrowing of the right renal artery.  Severe narrowing of the left renal artery. -There is also distal right external iliac and common femoral artery stenosis to 60%.  Hemodynamically significant narrowing of the right popliteal artery.  Patent right femoral artery stent. -Has occluded left common iliac artery with near occlusion of the left external iliac artery.  Occluded left femoral artery and narrowing of left popliteal artery. -Dr. 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  Per vascular surgery, will continue antibiotics through 08/08/2021.  Patient seen by PT OT, they recommended CIR.  Placement in progress.  History of CVA (cerebrovascular accident) Patient no longer on aspirin for unclear reasons.  Although suspect could be related to his metastatic brain disease.   HTN (hypertension): Does not appear to be on any medications PTA.  Currently blood pressure elevated, likely secondary to pain.  Will place on as needed hydralazine.   CAD (coronary artery disease) S/p CABG, PCI s/p DES -Asymptomatic -Continue atorvastatin.  Unclear why patient stopped aspirin though suspect could be related to his brain metastasis.   History of seizure Last admitted in March 2023 for seizure.  Continue home [place].   Lung cancer metastatic to brain [facility]) Follows with Dr. Mickeal Skinner with oncology.  Currently on tapering Decadron for left sided weakness due to vasogenic edema.  Continue home Decadron.  DVT prophylaxis: heparin injection 5,000 Units Start: 08/07/21 1400 SCD's Start: 08/06/21 1226   Code Status: Full Code  Family Communication: None at bedside today.   Status is: Inpatient Remains inpatient appropriate because: Underwent left AKA  today   Estimated body mass index is 23.23 kg/m as calculated from the following:   Height as of this encounter: 6' 0.99" (1.854 m).   Weight as of this encounter: 79.8 kg.    Nutritional Assessment: Body mass index is 23.23 kg/m.Marland Kitchen Seen by dietician.  I agree with the assessment and plan as outlined below: Nutrition Status:        . Skin Assessment: I have examined the patient's skin and I agree with the wound assessment as performed by the wound care RN as outlined below:    Consultants:  Vascular surgery  Procedures:  Left AKA 08/06/2021  Antimicrobials:  Anti-infectives (From admission, onward)    Start     Dose/Rate Route Frequency Ordered Stop   08/06/21 1800  vancomycin (VANCOREADY) IVPB 1250 mg/250 mL        1,250 mg 166.7 mL/hr over 90 Minutes Intravenous Every 12 hours 08/06/21 1656     08/06/21 1000  vancomycin (VANCOREADY) IVPB 1250 mg/250 mL  Status:  Discontinued        1,250 mg 166.7 mL/hr over 90 Minutes Intravenous Every 12 hours 08/05/21 2126 08/06/21 1656   08/06/21 0345  piperacillin-tazobactam (ZOSYN) IVPB 3.375 g       See Hyperspace for full Linked Orders Report.   3.375 g 12.5 mL/hr over 240 Minutes Intravenous Every 8 hours 08/05/21 1943     08/05/21 1945  vancomycin (VANCOREADY) IVPB 1750 mg/350 mL        1,750 mg 175 mL/hr over 120 Minutes Intravenous  Once 08/05/21 1943 08/06/21 0050   08/05/21 1945  piperacillin-tazobactam (ZOSYN) IVPB 3.375 g       See Hyperspace for full Linked Orders Report.   3.375 g 100 mL/hr over 30 Minutes Intravenous  Once 08/05/21 1943 08/05/21 2129         Subjective:  Patient seen and examined.  Sitting in the recliner.  Has no complaints other than pain.  Objective: Vitals:   08/07/21 0915 08/07/21 0930 08/07/21 0945 08/07/21 1130  BP:    123/73  Pulse: 91 93 95 98  Resp: 17 17 12 16   Temp:    98.7 F (37.1 C)  TempSrc:    Oral  SpO2: 93% 94% 95% 93%  Weight:      Height:         Intake/Output Summary (Last 24 hours) at 08/07/2021 1327 Last data filed at 08/07/2021 0545 Gross per 24 hour  Intake 1385.36 ml  Output 1700 ml  Net -314.64 ml    Filed Weights   08/05/21 1758 08/06/21 0704  Weight: 79.8 kg 79.8 kg    Examination:  General exam: Appears calm and comfortable  Respiratory system: Clear to auscultation. Respiratory effort normal. Cardiovascular system: S1 & S2 heard, RRR. No JVD, murmurs, rubs, gallops or clicks. No pedal edema. Gastrointestinal system: Abdomen is nondistended, soft and nontender. No organomegaly or masses felt. Normal bowel sounds heard. Central nervous system: Alert and oriented. No focal neurological deficits. Extremities: Left AKA Skin: No rashes, lesions or ulcers.  Psychiatry: Judgement and insight appear normal. Mood & affect appropriate.    Data Reviewed: I have personally reviewed following labs and imaging studies  CBC: Recent Labs  Lab 08/05/21 1802 08/06/21 0316 08/07/21 0505  WBC 19.6* 15.9* 12.7*  NEUTROABS 17.6*  --   --   HGB 12.3* 11.1* 12.6*  HCT 37.6* 33.7* 38.2*  MCV 94.7 93.9 91.4  PLT 222 195 774    Basic Metabolic Panel: Recent Labs  Lab 08/05/21 1802 08/07/21 0505  NA 135 134*  K 4.0 4.0  CL 101 99  CO2 23 24  GLUCOSE 149* 98  BUN 20 6*  CREATININE 0.67 0.73  CALCIUM 8.9 8.9    GFR: Estimated Creatinine Clearance: 103.9 mL/min (by C-G formula based on SCr of 0.73 mg/dL). Liver Function Tests: Recent Labs  Lab 08/05/21 1802  AST 24  ALT 21  ALKPHOS 63  BILITOT 0.4  PROT 6.4*  ALBUMIN 2.8*    No results for input(s): LIPASE, AMYLASE in the last 168 hours. No results for input(s): AMMONIA in the last 168 hours. Coagulation Profile: No results for input(s): INR, PROTIME in the last 168 hours. Cardiac Enzymes: No results for input(s): CKTOTAL, CKMB, CKMBINDEX, TROPONINI in the last 168 hours. BNP (last 3 results) No results for input(s): PROBNP in the last 8760  hours. HbA1C: No results for input(s): HGBA1C in the last 72 hours. CBG: No results for input(s): GLUCAP in the last 168 hours. Lipid Profile: No results for input(s): CHOL, HDL, LDLCALC, TRIG, CHOLHDL, LDLDIRECT in the last 72 hours. Thyroid Function Tests: No results for input(s): TSH, T4TOTAL, FREET4, T3FREE, THYROIDAB in the last 72 hours. Anemia Panel: No results for input(s): VITAMINB12, FOLATE, FERRITIN, TIBC, IRON, RETICCTPCT in the last 72 hours. Sepsis Labs: Recent Labs  Lab 08/05/21 1802  LATICACIDVEN 1.6     Recent Results (from the past 240 hour(s))  Culture, blood (routine x 2)     Status: None (Preliminary result)   Collection Time: 08/05/21  7:56 PM   Specimen: BLOOD LEFT FOREARM  Result Value Ref Range Status   Specimen Description BLOOD LEFT FOREARM  Final   Special Requests   Final    BOTTLES DRAWN AEROBIC AND ANAEROBIC Blood Culture adequate volume   Culture   Final    NO GROWTH 2 DAYS Performed at [facility], [place] [address] [place], [place] [address]    Report Status PENDING  Incomplete  Culture, blood (routine x 2)     Status: None (Preliminary result)   Collection Time: 08/05/21  8:05 PM   Specimen: Left Antecubital; Blood  Result Value Ref Range Status   Specimen Description LEFT ANTECUBITAL  Final   Special Requests   Final    BOTTLES DRAWN AEROBIC AND ANAEROBIC Blood Culture adequate volume   Culture   Final    NO GROWTH 2 DAYS Performed at [facility], [place] [address], [place], [place] [address]    Report Status PENDING  Incomplete  Surgical pcr screen     Status: None   Collection Time: 08/06/21  7:30 AM   Specimen: Nasal Mucosa; Nasal Swab  Result Value Ref Range Status   MRSA, PCR NEGATIVE NEGATIVE Final   Staphylococcus aureus NEGATIVE NEGATIVE Final    Comment: (NOTE) The Xpert SA Assay (FDA approved for NASAL specimens in patients 52 years of age and older), is one component of a comprehensive surveillance program.  It is not intended to diagnose infection nor to guide or monitor treatment. Performed at [facility], [place] [address] [place],  [address]       Radiology Studies: No results found.  Scheduled Meds:  atorvastatin  80 mg Oral Daily  dexamethasone  1 mg Oral Daily   docusate sodium  100 mg Oral Daily   gabapentin  100 mg Oral TID   heparin injection (subcutaneous)  5,000 Units Subcutaneous Q8H   levETIRAcetam  500 mg Oral BID   nicotine  21 mg Transdermal Daily   pantoprazole  40 mg Oral Daily   Continuous Infusions:  magnesium sulfate bolus IVPB     piperacillin-tazobactam (ZOSYN)  IV 3.375 g (08/07/21 1109)   vancomycin 1,250 mg (08/07/21 0545)     LOS: 2 days   Darliss Cheney, MD [facility]  08/07/2021, 1:27 PM   *Please note that this is a verbal dictation therefore any spelling or grammatical errors are due to the "[place] [facility]" system interpretation.  Please page via [place] and do not message via secure chat for urgent patient care matters. Secure chat can be used for non urgent patient care matters.  How to contact the [facility] Attending or Consulting provider [place] or covering provider during after hours [place], for this patient?  Check the care team in [facility] and look for a) attending/consulting [facility] provider listed and b) the [facility] team listed. Page or secure chat [phone]. Log into [email or web address] and use [place]'s universal password to access. If you do not have the password, please contact the hospital operator. Locate the [facility] provider you are looking for under [facility] and page to a number that you can be directly reached. If you still have difficulty reaching the provider, please page the [facility] (Director on Call) for the Hospitalists listed on amion for assistance.

## 2021-08-07 NOTE — Progress Notes (Signed)
   VASCULAR SURGERY ASSESSMENT & PLAN:   POD 1 LEFT BKA: Some drainage on medial aspect of dressing.  Plan dressing change tomorrow.  His pain is well controlled.  DVT PROPHYLAXIS: I will start subcu heparin.  ID: I would continue his IV antibiotics for 48 hours postop.   SUBJECTIVE:   No complaints this morning.  His pain is well controlled.  PHYSICAL EXAM:   Vitals:   08/06/21 1905 08/06/21 2319 08/07/21 0319 08/07/21 0406  BP: (!) 154/68 (!) 154/68 (!) 175/72 (!) 157/62  Pulse: 90 75 75 79  Resp: 17 14 19 17   Temp: 98.3 F (36.8 C) 98.3 F (36.8 C) 98.4 F (36.9 C)   TempSrc: Oral Oral Oral   SpO2: 94% 94% 95% 97%  Weight:      Height:       The dressing on his left BKA has some minimal drainage on the medial aspect.  LABS:   Lab Results  Component Value Date   WBC 12.7 (H) 08/07/2021   HGB 12.6 (L) 08/07/2021   HCT 38.2 (L) 08/07/2021   MCV 91.4 08/07/2021   PLT 210 08/07/2021   Lab Results  Component Value Date   CREATININE 0.73 08/07/2021   Lab Results  Component Value Date   INR 0.9 06/17/2021   CBG (last 3)  No results for input(s): GLUCAP in the last 72 hours.  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

## 2021-08-07 NOTE — Progress Notes (Signed)
   08/07/21 0910  Clinical Encounter Type  Visited With Patient  Visit Type Initial;Spiritual support  Referral From Nurse  Consult/Referral To Chaplain   Chaplin responded to a spiritual consult request for prayer.  Patient informed me that he did not wish to pray or have any conversation at this time.  I wished a peaceful day and departed.   Danice Goltz [facility]  [phone]

## 2021-08-08 LAB — CBC
HCT: 35.6 % — ABNORMAL LOW (ref 39.0–52.0)
Hemoglobin: 11.9 g/dL — ABNORMAL LOW (ref 13.0–17.0)
MCH: 30.5 pg (ref 26.0–34.0)
MCHC: 33.4 g/dL (ref 30.0–36.0)
MCV: 91.3 fL (ref 80.0–100.0)
Platelets: 210 10*3/uL (ref 150–400)
RBC: 3.9 MIL/uL — ABNORMAL LOW (ref 4.22–5.81)
RDW: 15.7 % — ABNORMAL HIGH (ref 11.5–15.5)
WBC: 11.1 10*3/uL — ABNORMAL HIGH (ref 4.0–10.5)
nRBC: 0 % (ref 0.0–0.2)

## 2021-08-08 LAB — BASIC METABOLIC PANEL
Anion gap: 6 (ref 5–15)
BUN: 8 mg/dL (ref 8–23)
CO2: 26 mmol/L (ref 22–32)
Calcium: 8.6 mg/dL — ABNORMAL LOW (ref 8.9–10.3)
Chloride: 102 mmol/L (ref 98–111)
Creatinine, Ser: 0.74 mg/dL (ref 0.61–1.24)
GFR, Estimated: >60 mL/min (ref >60)
Glucose, Bld: 106 mg/dL — ABNORMAL HIGH (ref 70–99)
Potassium: 3.8 mmol/L (ref 3.5–5.1)
Sodium: 134 mmol/L — ABNORMAL LOW (ref 135–145)

## 2021-08-08 NOTE — Progress Notes (Addendum)
  Progress Note    08/08/2021 8:02 AM 2 Days Post-Op  Subjective:  no complaints   Vitals:   08/08/21 0524 08/08/21 0742  BP:  (!) 132/92  Pulse:  88  Resp: 13 13  Temp:  98.1 F (36.7 C)  SpO2:  94%   Physical Exam: Lungs:  non labored Incisions:  L AKA pictured below Neurologic: A&O     CBC    Component Value Date/Time   WBC 11.1 (H) 08/08/2021 0337   RBC 3.90 (L) 08/08/2021 0337   HGB 11.9 (L) 08/08/2021 0337   HCT 35.6 (L) 08/08/2021 0337   PLT 210 08/08/2021 0337   MCV 91.3 08/08/2021 0337   MCH 30.5 08/08/2021 0337   MCHC 33.4 08/08/2021 0337   RDW 15.7 (H) 08/08/2021 0337   LYMPHSABS 1.1 08/05/2021 1802   MONOABS 0.7 08/05/2021 1802   EOSABS 0.0 08/05/2021 1802   BASOSABS 0.0 08/05/2021 1802    BMET    Component Value Date/Time   NA 134 (L) 08/08/2021 0337   K 3.8 08/08/2021 0337   CL 102 08/08/2021 0337   CO2 26 08/08/2021 0337   GLUCOSE 106 (H) 08/08/2021 0337   BUN 8 08/08/2021 0337   CREATININE 0.74 08/08/2021 0337   CALCIUM 8.6 (L) 08/08/2021 0337   GFRNONAA >60 08/08/2021 0337    INR    Component Value Date/Time   INR 0.9 06/17/2021 1416     Intake/Output Summary (Last 24 hours) at 08/08/2021 0802 Last data filed at 08/08/2021 0341 Gross per 24 hour  Intake 360 ml  Output 1500 ml  Net -1140 ml     Assessment/Plan:  66 y.o. male is s/p L AKA 2 Days Post-Op   L AKA healing well Continue dry dressing changes daily Office will arrange 4-6 week staple removal   Dagoberto Ligas, PA-C [facility] [phone] 08/08/2021 8:02 AM  I have interviewed the patient and examined the patient. I agree with the findings by the PA. Begin daily dressing changes.   Gae Gallop, MD

## 2021-08-08 NOTE — Progress Notes (Signed)
PT Cancellation Note  Patient Details Name: Roger Solis MRN: [id number] DOB: 1955/10/26   Cancelled Treatment:    Reason Eval/Treat Not Completed: Patient declined, no reason specified; patient reports finally comfortable and does not wish to move out of the bed.  States sat up earlier when dressing changed.  Reviewed L LE exercises in bed (glut sets, hip flexion and abduction) and to work on UE strength pulling up on rails in the bed.  Encouraged OOB even to wheelchair, but pt continued to refuse.  States plans for transfer to rehab hopefully tomorrow and will continue his therapy there.  Educated would not get therapy day of transfer, but pt continue to decline.  Will attempt another day.   Reginia Naas 08/08/2021, 2:01 PM Magda Kiel, [place] WUJWJ:[phone] Office:[phone] 08/08/2021

## 2021-08-08 NOTE — Progress Notes (Signed)
Inpatient Rehabilitation Admissions Coordinator   I have insurance approval for CIR admit but no bed available to admit him today. I am hopeful for bed in the next 24 to 48 hrs. I met at bedside with patient and spoke with his daughter by phone and they are aware. I will follow up tomorrow.  Danne Baxter, RN, MSN Rehab Admissions Coordinator [phone] 08/08/2021 11:33 AM

## 2021-08-08 NOTE — H&P (Signed)
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 Ellene Route and followed by Dr. 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  Per chart review patient lives with his daughter.  1 level home one-step to entry.  Daughter does assist with some ADLs.  Patient was using a walker.  Presented 08/05/2021 after recent angiogram showed severe multilevel arterial occlusive disease of the left lower extremity as well as gangrenous changes.  Limb was not felt to be salvageable.  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 Deitra Mayo.  Hospital course pain management.  Subcutaneous heparin added for DVT prophylaxis.  Therapy evaluations completed due to patient decreased functional mobility was admitted for a comprehensive rehab program.  Review of Systems  Constitutional:  Positive for malaise/fatigue. Negative for chills and fever.  HENT:  Negative for hearing loss.   Eyes:  Negative for blurred vision and double vision.  Respiratory:  Negative for cough and wheezing.        Shortness of breath with heavy exertion  Cardiovascular:  Positive for leg swelling. Negative for chest pain and palpitations.  Gastrointestinal:  Positive for constipation. Negative for heartburn, nausea and vomiting.  Genitourinary:  Negative for dysuria, flank pain and hematuria.  Musculoskeletal:  Positive for joint pain and myalgias.  Skin:  Negative for rash.  Neurological:  Positive for weakness.  All other systems reviewed and are negative. Past  Medical History:  Diagnosis Date   CAD (coronary artery disease)    History of brain cancer    HLD (hyperlipidemia)    Hypertension    PAD (peripheral artery disease) ([place])    Past Surgical History:  Procedure Laterality Date   AMPUTATION Left 08/06/2021   Procedure: LEFTAMPUTATION ABOVE KNEE;  Surgeon: Angelia Mould, MD;  Location: [facility];  Service: Vascular;  Laterality: Left;   APPLICATION OF CRANIAL NAVIGATION N/A 05/23/2021   Procedure: APPLICATION OF CRANIAL NAVIGATION;  Surgeon: Kristeen Miss, MD;  Location: [facility];  Service: Neurosurgery;  Laterality: N/A;   double bypass  2012   FRAMELESS  BIOPSY WITH BRAINLAB N/A 05/23/2021   Procedure: Stereotactic needle biopsy of the brain w/brainlab;  Surgeon: Kristeen Miss, MD;  Location: [facility];  Service: Neurosurgery;  Laterality: N/A;   Family History  Problem Relation Age of Onset   Heart disease Mother    Heart disease Father    Social History:  reports that he has been smoking cigarettes. He has been smoking an average of 1 pack per day. He has never used smokeless tobacco. He reports current alcohol use. He reports that he does not use drugs. Allergies: No Known Allergies Medications Prior to Admission  Medication Sig Dispense Refill   albuterol (VENTOLIN HFA) 108 (90 Base) MCG/ACT inhaler Inhale 1 puff into the lungs every 6 (six) hours as needed for shortness of breath.     atorvastatin (LIPITOR) 80 MG tablet Take 80 mg by mouth daily.     cephALEXin (KEFLEX) 500 MG capsule Take 500 mg by mouth 3 (three) times daily.     dexamethasone (DECADRON) 1 MG tablet Take  3 tablets (3 mg total) by mouth daily with breakfast for 7 days, THEN 2 tablets (2 mg total) daily with breakfast for 7 days, THEN 1 tablet (1 mg total) daily with breakfast for 7 days. 42 tablet 0   gabapentin (NEURONTIN) 100 MG capsule Take 100 mg by mouth 3 (three) times daily.     levETIRAcetam (KEPPRA) 500 MG tablet Take 1 tablet (500 mg total) by mouth 2 (two)  times daily. 120 tablet 2   ondansetron (ZOFRAN-ODT) 4 MG disintegrating tablet Take 1 tablet (4 mg total) by mouth every 8 (eight) hours as needed for nausea or vomiting. 20 tablet 0      Home: Home Living Family/patient expects to be discharged to:: Inpatient rehab Living Arrangements: Children Available Help at Discharge: Family, Available 24 hours/day Type of Home: House Home Access: Stairs to enter [employer] of Steps: 1&1 Home Layout: One level Bathroom Shower/Tub: Chiropodist: Standard Bathroom Accessibility: No Home Equipment: Conservation officer, nature (2 wheels), Rollator (4 wheels), Shower seat Additional Comments: Moved form [place] in Feb 2023 to live with daughter  Lives With: Daughter   Functional History: Prior Function Prior Level of Function : Needs assist Physical Assist : ADLs (physical) Mobility Comments: was walking with a walker ADLs Comments: daughter helped to dress, states he could get in and out of the shower  Functional Status:  Mobility: Bed Mobility Overal bed mobility: Needs Assistance Bed Mobility: Sit to Supine Supine to sit: Mod assist Sit to supine: Min guard General bed mobility comments: with HOB significantly  elevated Transfers Overall transfer level: Needs assistance Equipment used: Rolling walker (2 wheels) Transfers: Sit to/from Stand, Bed to chair/wheelchair/BSC Sit to Stand: Min assist Bed to/from chair/wheelchair/BSC transfer type:: Step pivot Step pivot transfers: Min assist General transfer comment: up from higher surface with cues for hand placement some lifting help and assist for balance, step/hop to recliner with RW with min/mod A for balance, walker safety and cues      ADL: ADL Overall ADL's : Needs assistance/impaired Eating/Feeding: Independent, Sitting Grooming: Set up, Sitting Upper Body Bathing: Set up, Sitting Lower Body Bathing: Moderate assistance, Sit to/from stand Upper Body  Dressing : Set up, Sitting Lower Body Dressing: Moderate assistance, Sit to/from stand Toilet Transfer: Minimal assistance, Ambulation, Rolling walker (2 wheels) Toilet Transfer Details (indicate cue type and reason): step hop with RW Toileting- Clothing Manipulation and Hygiene: Min guard, Sitting/lateral lean Functional mobility during ADLs: Minimal assistance, Rolling walker (2 wheels) General ADL Comments: min A for position changes, increased time and effort for all movement  Cognition: Cognition Overall Cognitive Status: Within Functional Limits for tasks assessed Orientation Level: Oriented X4 Cognition Arousal/Alertness: Awake/alert Behavior During Therapy: WFL for tasks assessed/performed Overall Cognitive Status: Within Functional Limits for tasks assessed  Physical Exam: Blood pressure (!) 132/92, pulse 88, temperature 98.1 F (36.7 C), temperature source Oral, resp. rate 13, height 6' 0.99" (1.854 m), weight 79.8 kg, SpO2 94 %. Physical Exam Skin:    Comments: Left AKA site is dressed with dry dressing in place  Neurological:     Comments: Patient is alert.  Oriented x3 and follows commands.    Results for orders placed or performed during the hospital encounter of 08/05/21 (from the past 48 hour(s))  Basic metabolic panel     Status: Abnormal   Collection Time: 08/07/21  5:05 AM  Result Value Ref Range   Sodium 134 (L) 135 - 145 mmol/L   Potassium 4.0 3.5 -  5.1 mmol/L   Chloride 99 98 - 111 mmol/L   CO2 24 22 - 32 mmol/L   Glucose, Bld 98 70 - 99 mg/dL    Comment: Glucose reference range applies only to samples taken after fasting for at least 8 hours.   BUN 6 (L) 8 - 23 mg/dL   Creatinine, Ser 0.73 0.61 - 1.24 mg/dL   Calcium 8.9 8.9 - 10.3 mg/dL   GFR, Estimated >60 >60 mL/min    Comment: (NOTE) Calculated using the CKD-EPI Creatinine Equation (2021)    Anion gap 11 5 - 15    Comment: Performed at [facility] [address], [place], [place]  [address]  CBC     Status: Abnormal   Collection Time: 08/07/21  5:05 AM  Result Value Ref Range   WBC 12.7 (H) 4.0 - 10.5 K/uL   RBC 4.18 (L) 4.22 - 5.81 MIL/uL   Hemoglobin 12.6 (L) 13.0 - 17.0 g/dL   HCT 38.2 (L) 39.0 - 52.0 %   MCV 91.4 80.0 - 100.0 fL   MCH 30.1 26.0 - 34.0 pg   MCHC 33.0 30.0 - 36.0 g/dL   RDW 15.6 (H) 11.5 - 15.5 %   Platelets 210 150 - 400 K/uL   nRBC 0.0 0.0 - 0.2 %    Comment: Performed at [facility], [place] [address], [place], [place] [address]  Basic metabolic panel     Status: Abnormal   Collection Time: 08/08/21  3:37 AM  Result Value Ref Range   Sodium 134 (L) 135 - 145 mmol/L   Potassium 3.8 3.5 - 5.1 mmol/L   Chloride 102 98 - 111 mmol/L   CO2 26 22 - 32 mmol/L   Glucose, Bld 106 (H) 70 - 99 mg/dL    Comment: Glucose reference range applies only to samples taken after fasting for at least 8 hours.   BUN 8 8 - 23 mg/dL   Creatinine, Ser 0.74 0.61 - 1.24 mg/dL   Calcium 8.6 (L) 8.9 - 10.3 mg/dL   GFR, Estimated >60 >60 mL/min    Comment: (NOTE) Calculated using the CKD-EPI Creatinine Equation (2021)    Anion gap 6 5 - 15    Comment: Performed at [facility] [address] [place], [place] [address]  CBC     Status: Abnormal   Collection Time: 08/08/21  3:37 AM  Result Value Ref Range   WBC 11.1 (H) 4.0 - 10.5 K/uL   RBC 3.90 (L) 4.22 - 5.81 MIL/uL   Hemoglobin 11.9 (L) 13.0 - 17.0 g/dL   HCT 35.6 (L) 39.0 - 52.0 %   MCV 91.3 80.0 - 100.0 fL   MCH 30.5 26.0 - 34.0 pg   MCHC 33.4 30.0 - 36.0 g/dL   RDW 15.7 (H) 11.5 - 15.5 %   Platelets 210 150 - 400 K/uL   nRBC 0.0 0.0 - 0.2 %    Comment: Performed at [facility], [place] [address] [place], [place] [address]   No results found.    Blood pressure (!) 132/92, pulse 88, temperature 98.1 F (36.7 C), temperature source Oral, resp. rate 13, height 6' 0.99" (1.854 m), weight 79.8 kg, SpO2 94 %.  Medical Problem List and Plan: 1. Functional deficits secondary to left AKA  08/06/2021 secondary to multilevel arterial occlusive disease with ischemic gangrenous changes  -patient may *** shower  -ELOS/Goals: *** 2.  Antithrombotics: -DVT/anticoagulation:  Pharmaceutical: Heparin  -antiplatelet therapy: N/A  3. Pain Management: Neurontin 100 mg 3 times daily, hydrocodone as needed 4. Mood: Emotional support  -antipsychotic agents: N/A 5. Neuropsych: This patient is capable of making decisions on his own behalf. 6. Skin/Wound Care: Routine skin check 7. Fluids/Electrolytes/Nutrition: Routine in and outs with follow-up chemistries 8.  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  Status post stereotactic brain biopsy 05/23/2021.  Follow-up with Dr. Cecil Cobbs.  Decadron therapy as indicated 9.  Seizure prophylaxis.  Keppra 500 mg twice daily 10.  Hyperlipidemia.  Lipitor 11.  Tobacco abuse.  NicoDerm patch.  Counseling 12.  CAD/CABG.  No chest pain or shortness of breath    Cathlyn Parsons, PA-C 08/08/2021

## 2021-08-08 NOTE — Progress Notes (Signed)
PROGRESS NOTE    Roger Solis  ZOX:[id number] DOB: 01-17-1956 DOA: 08/05/2021 PCP: [facility]   Brief Narrative:   Roger Solis is a 66 y.o. 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   He was recently hospitalized him March with acute onset left-sided weakness of his arm, leg and face with CT head showing vasogenic edema and MRI concerning for tumor or brain abscess.  He was treated with IV dexamethasone and home Keppra with improvement in his weakness.  He had stereotactic biopsy of the right frontal lobe lesion which later was found to be radiation therapy induced brain necrosis.   Patient has been following with oncology Dr. Mickeal Skinner for worsening left leg weakness, numbness and pain with activity.  Oncology resumed him on Decadron 4 mg daily and patient had reported improvement in symptoms.   Has also been following with vascular surgery with recent CT angio showing multiple areas of hemodynamically significant vascular disease.  Had CT angio in April showing multilevel severe stenosis to bilateral lower extremity.   Family noticed discoloration of the left heel with a blister that started to weep and eventually ruptured.  Patient having severe pain and discomfort.       In the ED, he was afebrile mildly hypertensive, tachycardic with leukocytosis of 19.6 K.  BMP is unremarkable.  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  All of which are chronic. -Narrowing of the infrarenal abdominal aorta by 50%. -  50% narrowing of the right renal artery.  Severe narrowing of the left renal artery. -There is also distal right external iliac and common femoral artery stenosis to 60%.  Hemodynamically significant narrowing of the right popliteal artery.  Patent right femoral artery stent. -Has occluded left common iliac artery with near occlusion of the left external iliac artery.  Occluded left femoral artery and narrowing of left popliteal artery. -Dr. 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  Per vascular surgery, will continue antibiotics through 08/08/2021.  Patient seen by PT OT, they recommended CIR. [facility] insurance authorization approved, awaiting bed placement at CIR.  History of CVA (cerebrovascular accident) Patient no longer on aspirin for unclear reasons.  Although suspect could be related to his metastatic brain disease.   HTN (hypertension): Does not appear to be on any medications PTA.  Currently blood pressure elevated, likely secondary to pain.  Will place on as needed hydralazine.   CAD (coronary artery disease) S/p CABG, PCI s/p DES -Asymptomatic -Continue atorvastatin.  Unclear why patient stopped aspirin though suspect could be related to his brain metastasis.   History of seizure Last admitted in March 2023 for seizure.  Continue home [place].   Lung cancer metastatic to brain [facility]) Follows with Dr. Mickeal Skinner with oncology.  Currently on tapering Decadron for left sided weakness due to vasogenic  edema.  Continue home Decadron.    DVT prophylaxis: heparin injection 5,000 Units Start: 08/07/21 1400 SCD's Start: 08/06/21 1226   Code Status: Full Code  Family Communication: Daughter at bedside   Status is: Inpatient Remains  inpatient appropriate because: Medically stable.  Awaiting bed placement at CIR.   Estimated body mass index is 23.23 kg/m as calculated from the following:   Height as of this encounter: 6' 0.99" (1.854 m).   Weight as of this encounter: 79.8 kg.    Nutritional Assessment: Body mass index is 23.23 kg/m.Marland Kitchen Seen by dietician.  I agree with the assessment and plan as outlined below: Nutrition Status:        . Skin Assessment: I have examined the patient's skin and I agree with the wound assessment as performed by the wound care RN as outlined below:    Consultants:  Vascular surgery  Procedures:  Left AKA 08/06/2021  Antimicrobials:  Anti-infectives (From admission, onward)    Start     Dose/Rate Route Frequency Ordered Stop   08/06/21 1800  vancomycin (VANCOREADY) IVPB 1250 mg/250 mL        1,250 mg 166.7 mL/hr over 90 Minutes Intravenous Every 12 hours 08/06/21 1656 08/07/21 1855   08/06/21 1000  vancomycin (VANCOREADY) IVPB 1250 mg/250 mL  Status:  Discontinued        1,250 mg 166.7 mL/hr over 90 Minutes Intravenous Every 12 hours 08/05/21 2126 08/06/21 1656   08/06/21 0345  piperacillin-tazobactam (ZOSYN) IVPB 3.375 g       See Hyperspace for full Linked Orders Report.   3.375 g 12.5 mL/hr over 240 Minutes Intravenous Every 8 hours 08/05/21 1943 08/07/21 2359   08/05/21 1945  vancomycin (VANCOREADY) IVPB 1750 mg/350 mL        1,750 mg 175 mL/hr over 120 Minutes Intravenous  Once 08/05/21 1943 08/06/21 0050   08/05/21 1945  piperacillin-tazobactam (ZOSYN) IVPB 3.375 g       See Hyperspace for full Linked Orders Report.   3.375 g 100 mL/hr over 30 Minutes Intravenous  Once 08/05/21 1943 08/05/21 2129         Subjective:  Seen and examined.  No new complaints other than pain in the left lower extremity.  Daughter at the bedside.  Objective: Vitals:   08/08/21 0341 08/08/21 0524 08/08/21 0742 08/08/21 1115  BP: (!) 151/74  (!) 132/92 (!) 153/63  Pulse:   88  90  Resp: 20 13 13 15   Temp: 98.5 F (36.9 C)  98.1 F (36.7 C) 98.5 F (36.9 C)  TempSrc: Oral  Oral Oral  SpO2:   94% 96%  Weight:      Height:        Intake/Output Summary (Last 24 hours) at 08/08/2021 1156 Last data filed at 08/08/2021 1114 Gross per 24 hour  Intake 360 ml  Output 1700 ml  Net -1340 ml    Filed Weights   08/05/21 1758 08/06/21 0704  Weight: 79.8 kg 79.8 kg    Examination:  General exam: Appears calm and comfortable  Respiratory system: Clear to auscultation. Respiratory effort normal. Cardiovascular system: S1 & S2 heard, RRR. No JVD, murmurs, rubs, gallops or clicks. No pedal edema. Gastrointestinal system: Abdomen is nondistended, soft and nontender. No organomegaly or masses felt. Normal bowel sounds heard. Central nervous system: Alert and oriented. No focal neurological deficits. Extremities: Left AKA Skin: No rashes, lesions or ulcers.  Psychiatry: Judgement and insight appear normal. Mood & affect appropriate.   Data  Reviewed: I have personally reviewed following labs and imaging studies  CBC: Recent Labs  Lab 08/05/21 1802 08/06/21 0316 08/07/21 0505 08/08/21 0337  WBC 19.6* 15.9* 12.7* 11.1*  NEUTROABS 17.6*  --   --   --   HGB 12.3* 11.1* 12.6* 11.9*  HCT 37.6* 33.7* 38.2* 35.6*  MCV 94.7 93.9 91.4 91.3  PLT 222 195 210 300    Basic Metabolic Panel: Recent Labs  Lab 08/05/21 1802 08/07/21 0505 08/08/21 0337  NA 135 134* 134*  K 4.0 4.0 3.8  CL 101 99 102  CO2 23 24 26   GLUCOSE 149* 98 106*  BUN 20 6* 8  CREATININE 0.67 0.73 0.74  CALCIUM 8.9 8.9 8.6*    GFR: Estimated Creatinine Clearance: 103.9 mL/min (by C-G formula based on SCr of 0.74 mg/dL). Liver Function Tests: Recent Labs  Lab 08/05/21 1802  AST 24  ALT 21  ALKPHOS 63  BILITOT 0.4  PROT 6.4*  ALBUMIN 2.8*    No results for input(s): LIPASE, AMYLASE in the last 168 hours. No results for input(s): AMMONIA in the last 168 hours. Coagulation  Profile: No results for input(s): INR, PROTIME in the last 168 hours. Cardiac Enzymes: No results for input(s): CKTOTAL, CKMB, CKMBINDEX, TROPONINI in the last 168 hours. BNP (last 3 results) No results for input(s): PROBNP in the last 8760 hours. HbA1C: No results for input(s): HGBA1C in the last 72 hours. CBG: No results for input(s): GLUCAP in the last 168 hours. Lipid Profile: No results for input(s): CHOL, HDL, LDLCALC, TRIG, CHOLHDL, LDLDIRECT in the last 72 hours. Thyroid Function Tests: No results for input(s): TSH, T4TOTAL, FREET4, T3FREE, THYROIDAB in the last 72 hours. Anemia Panel: No results for input(s): VITAMINB12, FOLATE, FERRITIN, TIBC, IRON, RETICCTPCT in the last 72 hours. Sepsis Labs: Recent Labs  Lab 08/05/21 1802  LATICACIDVEN 1.6     Recent Results (from the past 240 hour(s))  Culture, blood (routine x 2)     Status: None (Preliminary result)   Collection Time: 08/05/21  7:56 PM   Specimen: BLOOD LEFT FOREARM  Result Value Ref Range Status   Specimen Description BLOOD LEFT FOREARM  Final   Special Requests   Final    BOTTLES DRAWN AEROBIC AND ANAEROBIC Blood Culture adequate volume   Culture   Final    NO GROWTH 3 DAYS Performed at [facility], [place] [address], [place], [place] [address]    Report Status PENDING  Incomplete  Culture, blood (routine x 2)     Status: None (Preliminary result)   Collection Time: 08/05/21  8:05 PM   Specimen: Left Antecubital; Blood  Result Value Ref Range Status   Specimen Description LEFT ANTECUBITAL  Final   Special Requests   Final    BOTTLES DRAWN AEROBIC AND ANAEROBIC Blood Culture adequate volume   Culture   Final    NO GROWTH 3 DAYS Performed at [facility], [place] [address] [place], [place] [address]    Report Status PENDING  Incomplete  Surgical pcr screen     Status: None   Collection Time: 08/06/21  7:30 AM   Specimen: Nasal Mucosa; Nasal Swab  Result Value Ref Range Status   MRSA, PCR  NEGATIVE NEGATIVE Final   Staphylococcus aureus NEGATIVE NEGATIVE Final    Comment: (NOTE) The Xpert SA Assay (FDA approved for NASAL specimens in patients 28 years of age and older), is one component of a comprehensive surveillance program. It is not intended  to diagnose infection nor to guide or monitor treatment. Performed at [facility], [place] [address] [place], [place] [address]       Radiology Studies: No results found.  Scheduled Meds:  atorvastatin  80 mg Oral Daily   dexamethasone  1 mg Oral Daily   docusate sodium  100 mg Oral Daily   gabapentin  100 mg Oral TID   heparin injection (subcutaneous)  5,000 Units Subcutaneous Q8H   levETIRAcetam  500 mg Oral BID   nicotine  21 mg Transdermal Daily   pantoprazole  40 mg Oral Daily   Continuous Infusions:  magnesium sulfate bolus IVPB       LOS: 3 days   Darliss Cheney, MD [facility]  08/08/2021, 11:56 AM   *Please note that this is a verbal dictation therefore any spelling or grammatical errors are due to the "[place]" system interpretation.  Please page via [place] and do not message via secure chat for urgent patient care matters. Secure chat can be used for non urgent patient care matters.  How to contact the [facility] Attending or Consulting provider [place] or covering provider during after hours [place], for this patient?  Check the care team in [facility] and look for a) attending/consulting [facility] provider listed and b) the [facility] team listed. Page or secure chat [phone]. Log into [email or web address] and use [place]'s universal password to access. If you do not have the password, please contact the hospital operator. Locate the [facility] provider you are looking for under [facility] and page to a number that you can be directly reached. If you still have difficulty reaching the provider, please page the [facility] (Director on Call) for the Hospitalists listed on amion for assistance.

## 2021-08-09 ENCOUNTER — Encounter (HOSPITAL_COMMUNITY): Payer: Self-pay | Admitting: Physical Medicine and Rehabilitation

## 2021-08-09 ENCOUNTER — Inpatient Hospital Stay (HOSPITAL_COMMUNITY)
Admission: RE | Admit: 2021-08-09 | Discharge: 2021-08-20 | DRG: 559 | Disposition: A | Discharge status: Home Health | Payer: No Typology Code available for payment source | Source: Intra-hospital | Attending: Physical Medicine and Rehabilitation | Admitting: Physical Medicine and Rehabilitation

## 2021-08-09 ENCOUNTER — Other Ambulatory Visit: Payer: Self-pay

## 2021-08-09 DIAGNOSIS — I10 Essential (primary) hypertension: Secondary | ICD-10-CM | POA: Diagnosis present

## 2021-08-09 DIAGNOSIS — G546 Phantom limb syndrome with pain: Secondary | ICD-10-CM | POA: Diagnosis present

## 2021-08-09 DIAGNOSIS — Z9889 Other specified postprocedural states: Secondary | ICD-10-CM | POA: Diagnosis not present

## 2021-08-09 DIAGNOSIS — I6789 Other cerebrovascular disease: Secondary | ICD-10-CM | POA: Diagnosis present

## 2021-08-09 DIAGNOSIS — C3490 Malignant neoplasm of unspecified part of unspecified bronchus or lung: Secondary | ICD-10-CM | POA: Diagnosis not present

## 2021-08-09 DIAGNOSIS — I2510 Atherosclerotic heart disease of native coronary artery without angina pectoris: Secondary | ICD-10-CM | POA: Diagnosis present

## 2021-08-09 DIAGNOSIS — Z79899 Other long term (current) drug therapy: Secondary | ICD-10-CM

## 2021-08-09 DIAGNOSIS — Y842 Radiological procedure and radiotherapy as the cause of abnormal reaction of the patient, or of later complication, without mention of misadventure at the time of the procedure: Secondary | ICD-10-CM | POA: Diagnosis present

## 2021-08-09 DIAGNOSIS — Z85841 Personal history of malignant neoplasm of brain: Secondary | ICD-10-CM | POA: Diagnosis not present

## 2021-08-09 DIAGNOSIS — R569 Unspecified convulsions: Secondary | ICD-10-CM | POA: Diagnosis not present

## 2021-08-09 DIAGNOSIS — E559 Vitamin D deficiency, unspecified: Secondary | ICD-10-CM | POA: Diagnosis present

## 2021-08-09 DIAGNOSIS — R531 Weakness: Secondary | ICD-10-CM | POA: Diagnosis present

## 2021-08-09 DIAGNOSIS — Z8673 Personal history of transient ischemic attack (TIA), and cerebral infarction without residual deficits: Secondary | ICD-10-CM

## 2021-08-09 DIAGNOSIS — K5900 Constipation, unspecified: Secondary | ICD-10-CM | POA: Diagnosis present

## 2021-08-09 DIAGNOSIS — D62 Acute posthemorrhagic anemia: Secondary | ICD-10-CM | POA: Diagnosis present

## 2021-08-09 DIAGNOSIS — Z89612 Acquired absence of left leg above knee: Secondary | ICD-10-CM | POA: Diagnosis present

## 2021-08-09 DIAGNOSIS — R29810 Facial weakness: Secondary | ICD-10-CM | POA: Diagnosis present

## 2021-08-09 DIAGNOSIS — Z89512 Acquired absence of left leg below knee: Secondary | ICD-10-CM

## 2021-08-09 DIAGNOSIS — E8809 Other disorders of plasma-protein metabolism, not elsewhere classified: Secondary | ICD-10-CM | POA: Diagnosis present

## 2021-08-09 DIAGNOSIS — F17210 Nicotine dependence, cigarettes, uncomplicated: Secondary | ICD-10-CM | POA: Diagnosis present

## 2021-08-09 DIAGNOSIS — D72829 Elevated white blood cell count, unspecified: Secondary | ICD-10-CM | POA: Diagnosis present

## 2021-08-09 DIAGNOSIS — C7931 Secondary malignant neoplasm of brain: Secondary | ICD-10-CM

## 2021-08-09 DIAGNOSIS — Z85118 Personal history of other malignant neoplasm of bronchus and lung: Secondary | ICD-10-CM | POA: Diagnosis not present

## 2021-08-09 DIAGNOSIS — G936 Cerebral edema: Secondary | ICD-10-CM | POA: Diagnosis not present

## 2021-08-09 DIAGNOSIS — L89322 Pressure ulcer of left buttock, stage 2: Secondary | ICD-10-CM | POA: Diagnosis present

## 2021-08-09 DIAGNOSIS — Z8249 Family history of ischemic heart disease and other diseases of the circulatory system: Secondary | ICD-10-CM | POA: Diagnosis not present

## 2021-08-09 DIAGNOSIS — I739 Peripheral vascular disease, unspecified: Secondary | ICD-10-CM | POA: Diagnosis present

## 2021-08-09 DIAGNOSIS — L8990 Pressure ulcer of unspecified site, unspecified stage: Secondary | ICD-10-CM | POA: Insufficient documentation

## 2021-08-09 DIAGNOSIS — E785 Hyperlipidemia, unspecified: Secondary | ICD-10-CM | POA: Diagnosis present

## 2021-08-09 DIAGNOSIS — Z4781 Encounter for orthopedic aftercare following surgical amputation: Secondary | ICD-10-CM | POA: Diagnosis present

## 2021-08-09 DIAGNOSIS — Z951 Presence of aortocoronary bypass graft: Secondary | ICD-10-CM | POA: Diagnosis not present

## 2021-08-09 DIAGNOSIS — Z95820 Peripheral vascular angioplasty status with implants and grafts: Secondary | ICD-10-CM | POA: Diagnosis not present

## 2021-08-09 LAB — SURGICAL PATHOLOGY

## 2021-08-09 MED ORDER — NICOTINE 21 MG/24HR TD PT24
21.0000 mg | MEDICATED_PATCH | Freq: Every day | TRANSDERMAL | Status: DC
  Administered 2021-08-10 – 2021-08-20 (×11): 21 mg via TRANSDERMAL
  Filled 2021-08-09 (×11): qty 1

## 2021-08-09 MED ORDER — SENNOSIDES-DOCUSATE SODIUM 8.6-50 MG PO TABS
1.0000 | ORAL_TABLET | Freq: Every evening | ORAL | Status: DC | PRN
  Administered 2021-08-11 – 2021-08-18 (×2): 1 via ORAL
  Filled 2021-08-09 (×2): qty 1

## 2021-08-09 MED ORDER — ALBUTEROL SULFATE (2.5 MG/3ML) 0.083% IN NEBU: 3.0000 mL | INHALATION_SOLUTION | Freq: Four times a day (QID) | RESPIRATORY_TRACT | Status: DC | PRN

## 2021-08-09 MED ORDER — DEXAMETHASONE 2 MG PO TABS
1.0000 mg | ORAL_TABLET | Freq: Every day | ORAL | Status: AC
  Administered 2021-08-10 – 2021-08-13 (×4): 1 mg via ORAL
  Filled 2021-08-09 (×4): qty 1

## 2021-08-09 MED ORDER — ALUM & MAG HYDROXIDE-SIMETH 200-200-20 MG/5ML PO SUSP: 15.0000 mL | ORAL | Status: DC | PRN

## 2021-08-09 MED ORDER — ATORVASTATIN CALCIUM 80 MG PO TABS
80.0000 mg | ORAL_TABLET | Freq: Every day | ORAL | Status: DC
  Administered 2021-08-10 – 2021-08-20 (×11): 80 mg via ORAL
  Filled 2021-08-09 (×11): qty 1

## 2021-08-09 MED ORDER — HYDROCODONE-ACETAMINOPHEN 5-325 MG PO TABS
1.0000 | ORAL_TABLET | ORAL | Status: DC | PRN
  Administered 2021-08-09 – 2021-08-10 (×3): 2 via ORAL
  Administered 2021-08-10: 1 via ORAL
  Administered 2021-08-10 – 2021-08-11 (×4): 2 via ORAL
  Administered 2021-08-11: 1 via ORAL
  Administered 2021-08-11 – 2021-08-14 (×19): 2 via ORAL
  Administered 2021-08-14: 1 via ORAL
  Administered 2021-08-15 – 2021-08-18 (×14): 2 via ORAL
  Administered 2021-08-18: 1 via ORAL
  Administered 2021-08-19 – 2021-08-20 (×4): 2 via ORAL
  Filled 2021-08-09 (×16): qty 2
  Filled 2021-08-09: qty 1
  Filled 2021-08-09 (×2): qty 2
  Filled 2021-08-09: qty 1
  Filled 2021-08-09 (×29): qty 2

## 2021-08-09 MED ORDER — DOCUSATE SODIUM 100 MG PO CAPS
100.0000 mg | ORAL_CAPSULE | Freq: Every day | ORAL | Status: DC
  Administered 2021-08-10 – 2021-08-20 (×10): 100 mg via ORAL
  Filled 2021-08-09 (×11): qty 1

## 2021-08-09 MED ORDER — LEVETIRACETAM 500 MG PO TABS
500.0000 mg | ORAL_TABLET | Freq: Two times a day (BID) | ORAL | Status: DC
  Administered 2021-08-09 – 2021-08-15 (×12): 500 mg via ORAL
  Filled 2021-08-09 (×12): qty 1

## 2021-08-09 MED ORDER — GABAPENTIN 100 MG PO CAPS
100.0000 mg | ORAL_CAPSULE | Freq: Three times a day (TID) | ORAL | Status: DC
  Administered 2021-08-09 – 2021-08-10 (×2): 100 mg via ORAL
  Filled 2021-08-09 (×2): qty 1

## 2021-08-09 MED ORDER — PANTOPRAZOLE SODIUM 40 MG PO TBEC
40.0000 mg | DELAYED_RELEASE_TABLET | Freq: Every day | ORAL | Status: DC
  Administered 2021-08-10 – 2021-08-20 (×11): 40 mg via ORAL
  Filled 2021-08-09 (×11): qty 1

## 2021-08-09 MED ORDER — ACETAMINOPHEN 325 MG PO TABS
325.0000 mg | ORAL_TABLET | Freq: Four times a day (QID) | ORAL | Status: DC | PRN
  Filled 2021-08-09: qty 2

## 2021-08-09 MED ORDER — HEPARIN SODIUM (PORCINE) 5000 UNIT/ML IJ SOLN
5000.0000 [IU] | Freq: Three times a day (TID) | INTRAMUSCULAR | Status: DC
  Administered 2021-08-09 – 2021-08-20 (×32): 5000 [IU] via SUBCUTANEOUS
  Filled 2021-08-09 (×32): qty 1

## 2021-08-09 MED ORDER — BISACODYL 10 MG RE SUPP: 10.0000 mg | Freq: Every day | RECTAL | Status: DC | PRN

## 2021-08-09 MED ORDER — POLYETHYLENE GLYCOL 3350 17 G PO PACK
17.0000 g | PACK | Freq: Every day | ORAL | Status: DC
  Administered 2021-08-09 – 2021-08-18 (×4): 17 g via ORAL
  Filled 2021-08-09 (×12): qty 1

## 2021-08-09 MED ORDER — HEPARIN SODIUM (PORCINE) 5000 UNIT/ML IJ SOLN: 5000.0000 [IU] | Freq: Three times a day (TID) | INTRAMUSCULAR | Status: DC

## 2021-08-09 NOTE — Progress Notes (Signed)
OT Cancellation Note  Patient Details Name: Roger Solis MRN: [id number] DOB: 1955/09/08   Cancelled Treatment:    Reason Eval/Treat Not Completed: Patient declined, no reason specified (Pt sitting up in bed watchign tv upon arrival. Declined to participate despite educaiton on the importance of OOB activity and exercise. Reviewed CIR requirement of therapy hours, he verbalized understanding. Will conitnue OT efforts.)  Aldona Bar A Mortimer Bair 08/09/2021, 10:19 AM

## 2021-08-09 NOTE — Progress Notes (Signed)
Physical Therapy Treatment Patient Details Name: Roger Solis MRN: [id number] DOB: 07-14-1955 Today's Date: 08/09/2021   History of Present Illness Roger Solis is a 66 y.o. 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    PT Comments    Patient participating well with PT this session with focus on variety of transfers, wheelchair mobility and management of phantom pain.  Difficulty steering wheelchair straight with L UE not coordinating to push in synchrony with R except for few pushes after cueing.  Patient reports previously taking meds for nausea at home and RN made aware of complaint.  Less assist today with transfers and bed mobility compared to evaluation.  He remains appropriate for acute inpatient rehab at d/c and note plans for transfer later today.   Recommendations for follow up therapy are one component of a multi-disciplinary discharge planning process, led by the attending physician.  Recommendations may be updated based on patient status, additional functional criteria and insurance authorization.  Follow Up Recommendations  Acute inpatient rehab (3hours/day)     Assistance Recommended at Discharge Frequent or constant Supervision/Assistance  Patient can return home with the following Assist for transportation;Assistance with cooking/housework;A lot of help with bathing/dressing/bathroom;Help with stairs or ramp for entrance;A little help with walking and/or transfers   Equipment Recommendations  Wheelchair (measurements PT)    Recommendations for Other Services       Precautions / Restrictions Precautions Precautions: Fall Restrictions LLE Weight Bearing: Non weight bearing     Mobility  Bed  Mobility Overal bed mobility: Needs Assistance Bed Mobility: Supine to Sit, Sit to Supine     Supine to sit: Min guard, HOB elevated Sit to supine: Supervision   General bed mobility comments: increased time due to pain, cues for rail, assist for balance; to supine increased time, using UE's to scoot hips over    Transfers Overall transfer level: Needs assistance Equipment used: Rolling walker (2 wheels) Transfers: Bed to chair/wheelchair/BSC, Sit to/from Stand Sit to Stand: Min assist   Step pivot transfers: Min assist Squat pivot transfers: Min assist     General transfer comment: demonstrated pivot to R without RW and pt performed with assist for safe transition and cues for hand placement; sit to stand and stand step back to bed with cues for hand placement, walker proximity/management and assist for balance    Ambulation/Gait                   Theme park manager mobility: Yes Wheelchair propulsion: Both upper extremities Wheelchair parts: Needs assistance Distance: 250 level and inclined tile Wheelchair Assistance Details (indicate cue type and reason): assist to manage to keep from veering to L and cues and demo for both hands pushing together and for technique on turns and for maneuvering around the bed in the room  Modified Rankin (Stroke Patients Only)       Balance Overall balance assessment: Needs assistance   Sitting balance-Leahy Scale: Good     Standing balance support: Bilateral upper extremity supported Standing balance-Leahy Scale: Poor Standing balance comment: due to AKA on L  Cognition Arousal/Alertness: Awake/alert Behavior During Therapy: WFL for tasks assessed/performed Overall Cognitive Status: Within Functional Limits for tasks assessed                                          Exercises      General Comments  General comments (skin integrity, edema, etc.): Education on rubbing/touching L residual limb for helping with phantom pain.  He also report history of nausea and takes something for it at home.      Pertinent Vitals/Pain Pain Assessment Pain Score: 7  Pain Location: L LE Pain Descriptors / Indicators: Operative site guarding, Aching Pain Intervention(s): Monitored during session    Home Living                          Prior Function            PT Goals (current goals can now be found in the care plan section) Progress towards PT goals: Progressing toward goals    Frequency    Min 3X/week      PT Plan Current plan remains appropriate    Co-evaluation              AM-PAC PT "6 Clicks" Mobility   Outcome Measure  Help needed turning from your back to your side while in a flat bed without using bedrails?: A Little Help needed moving from lying on your back to sitting on the side of a flat bed without using bedrails?: A Little Help needed moving to and from a bed to a chair (including a wheelchair)?: A Little Help needed standing up from a chair using your arms (e.g., wheelchair or bedside chair)?: A Little Help needed to walk in hospital room?: Total Help needed climbing 3-5 steps with a railing? : Total 6 Click Score: 14    End of Session Equipment Utilized During Treatment: Gait belt Activity Tolerance: Patient tolerated treatment well Patient left: in bed;with call bell/phone within reach;with bed alarm set Nurse Communication: Other (comment) (requesting nausea medication) PT Visit Diagnosis: Difficulty in walking, not elsewhere classified (R26.2);Pain Pain - Right/Left: Left Pain - part of body: Leg     Time: 1093-2355 PT Time Calculation (min) (ACUTE ONLY): 27 min  Charges:  $Therapeutic Activity: 8-22 mins $Wheel Chair Management: 8-22 mins                     Magda Kiel, PT Acute Rehabilitation  Services [phone] Office:[phone] 08/09/2021    Reginia Naas 08/09/2021, 12:08 PM

## 2021-08-09 NOTE — H&P (Signed)
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 Ellene Route and followed by Dr. 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 He was noted to have left sided weakness that patient reports had improved with dexamethasone. Per chart review patient lives with his daughter.  1 level home one-step to entry.  Daughter does assist with some ADLs.  Patient was using a walker.  Presented 08/05/2021 after recent angiogram showed severe multilevel arterial occlusive disease of the left lower extremity as well as gangrenous changes.  Limb was not felt to be salvageable.  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 Deitra Mayo.  Hospital course pain management.  Subcutaneous heparin added for DVT prophylaxis.  Therapy evaluations completed due to patient decreased functional mobility was admitted for a comprehensive rehab program. Pt reports constipation and reports he has not had a BM in a few days. He reports he only is able to walk short distances for the past couple years.  He has continued pain at residual limb and also phantom pain, medications are helping to keep this under control.    Review of Systems  Constitutional:  Positive for malaise/fatigue. Negative for chills and fever.  HENT:  Negative for hearing loss.   Eyes:  Negative for blurred vision and double vision.  Respiratory:  Negative for cough and wheezing.        Shortness of breath with heavy exertion  Cardiovascular:  Positive for leg swelling. Negative  for chest pain and palpitations.  Gastrointestinal:  Positive for constipation. Negative for heartburn, nausea and vomiting.  Genitourinary:  Negative for dysuria, flank pain and hematuria.  Musculoskeletal:  Positive for joint pain and myalgias.  Skin:  Negative for rash.  Neurological:  Positive for tremors and weakness.  All other systems reviewed and are negative.     Past Medical History:  Diagnosis Date   CAD (coronary artery disease)     History of brain cancer     HLD (hyperlipidemia)     Hypertension     PAD (peripheral artery disease) ([name])           Past Surgical History:  Procedure Laterality Date   AMPUTATION Left 08/06/2021    Procedure: LEFTAMPUTATION ABOVE KNEE;  Surgeon: Angelia Mould, MD;  Location: [facility];  Service: Vascular;  Laterality: Left;   APPLICATION OF CRANIAL NAVIGATION N/A 05/23/2021    Procedure: APPLICATION OF CRANIAL NAVIGATION;  Surgeon: Kristeen Miss, MD;  Location: [facility];  Service: Neurosurgery;  Laterality: N/A;   double bypass   2012   FRAMELESS  BIOPSY WITH BRAINLAB N/A 05/23/2021    Procedure: Stereotactic needle biopsy of the brain w/brainlab;  Surgeon: Kristeen Miss, MD;  Location: [facility];  Service: Neurosurgery;  Laterality: N/A;         Family History  Problem Relation Age of Onset   Heart disease Mother     Heart disease Father      Social History:  reports that he has been smoking cigarettes. He has been smoking an average of 1 pack per day.  He has never used smokeless tobacco. He reports current alcohol use. He reports that he does not use drugs. Allergies: No Known Allergies       Medications Prior to Admission  Medication Sig Dispense Refill   albuterol (VENTOLIN HFA) 108 (90 Base) MCG/ACT inhaler Inhale 1 puff into the lungs every 6 (six) hours as needed for shortness of breath.       atorvastatin (LIPITOR) 80 MG tablet Take 80 mg by mouth daily.       cephALEXin (KEFLEX) 500 MG capsule Take 500 mg by mouth 3 (three) times  daily.       dexamethasone (DECADRON) 1 MG tablet Take 3 tablets (3 mg total) by mouth daily with breakfast for 7 days, THEN 2 tablets (2 mg total) daily with breakfast for 7 days, THEN 1 tablet (1 mg total) daily with breakfast for 7 days. 42 tablet 0   gabapentin (NEURONTIN) 100 MG capsule Take 100 mg by mouth 3 (three) times daily.       levETIRAcetam (KEPPRA) 500 MG tablet Take 1 tablet (500 mg total) by mouth 2 (two) times daily. 120 tablet 2   ondansetron (ZOFRAN-ODT) 4 MG disintegrating tablet Take 1 tablet (4 mg total) by mouth every 8 (eight) hours as needed for nausea or vomiting. 20 tablet 0          Home: Home Living Family/patient expects to be discharged to:: Inpatient rehab Living Arrangements: Children Available Help at Discharge: Family, Available 24 hours/day Type of Home: House Home Access: Stairs to enter [employer] of Steps: 1&1 Home Layout: One level Bathroom Shower/Tub: Chiropodist: Standard Bathroom Accessibility: No Home Equipment: Conservation officer, nature (2 wheels), Rollator (4 wheels), Shower seat Additional Comments: Moved form [place] in Feb 2023 to live with daughter  Lives With: Daughter   Functional History: Prior Function Prior Level of Function : Needs assist Physical Assist : ADLs (physical) Mobility Comments: was walking with a walker ADLs Comments: daughter helped to dress, states he could get in and out of the shower   Functional Status:  Mobility: Bed Mobility Overal bed mobility: Needs Assistance Bed Mobility: Sit to Supine Supine to sit: Mod assist Sit to supine: Min guard General bed mobility comments: with HOB significantly  elevated Transfers Overall transfer level: Needs assistance Equipment used: Rolling walker (2 wheels) Transfers: Sit to/from Stand, Bed to chair/wheelchair/BSC Sit to Stand: Min assist Bed to/from chair/wheelchair/BSC transfer type:: Step pivot Step pivot transfers: Min  assist General transfer comment: up from higher surface with cues for hand placement some lifting help and assist for balance, step/hop to recliner with RW with min/mod A for balance, walker safety and cues   ADL: ADL Overall ADL's : Needs assistance/impaired Eating/Feeding: Independent, Sitting Grooming: Set up, Sitting Upper Body Bathing: Set up, Sitting Lower Body Bathing: Moderate assistance, Sit to/from stand Upper Body Dressing : Set up, Sitting Lower Body Dressing: Moderate assistance, Sit to/from stand Toilet Transfer: Minimal assistance, Ambulation, Rolling walker (2 wheels) Toilet Transfer Details (indicate cue type and reason): step hop with RW Toileting- Clothing Manipulation and Hygiene: Min guard, Sitting/lateral lean Functional mobility during ADLs: Minimal assistance, Rolling walker (2 wheels) General ADL Comments: min A for position changes, increased time and effort for all movement   Cognition: Cognition Overall Cognitive Status: Within Functional Limits for tasks assessed Orientation Level: Oriented X4 Cognition Arousal/Alertness: Awake/alert Behavior During Therapy: WFL for tasks assessed/performed Overall Cognitive Status: Within Functional Limits for tasks assessed   Physical  Exam: Blood pressure (!) 132/92, pulse 88, temperature 98.1 F (36.7 C), temperature source Oral, resp. rate 13, height 6' 0.99" (1.854 m), weight 79.8 kg, SpO2 94 %.     General: Alert , No apparent distress, in bed HEENT: R crani incision well healed, EOMI, sclera anicteric, oral mucosa pink and moist, hard of hearing Neck: Supple without JVD or lymphadenopathy Heart: Reg rate and rhythm. No murmurs rubs or gallops Chest: CTA bilaterally without wheezes, rales, or rhonchi; no distress Abdomen: Soft, non-tender, non-distended, bowel sounds positive. Extremities: R AKA, covered in clean and dry dressing Psych: Pt's affect is appropriate. Pt is cooperative Skin: Clean and intact  without signs of breakdown Neuro:  Alert and oriented x3, CN 2-12 intact other than mild facial weakness, follows commands, speech and language appears to be intact, mild L facial weakness noted(chronic), BL UE strength 5/5, RLE 4+/5 Hip flex, knee extension 5/5, Ankle PF and DF 4+/5, not able to lift L residual limb, sensation decreased in stocking glove distribution RLE, mild intention tremor noted Musculoskeletal:  No joint swelling or tenderness noted, R AKA with clean and dry dressing, tender to palpation around incision   R dorsal hand PIV     Lab Results Last 48 Hours        Results for orders placed or performed during the hospital encounter of 08/05/21 (from the past 48 hour(s))  Basic metabolic panel     Status: Abnormal    Collection Time: 08/07/21  5:05 AM  Result Value Ref Range    Sodium 134 (L) 135 - 145 mmol/L    Potassium 4.0 3.5 - 5.1 mmol/L    Chloride 99 98 - 111 mmol/L    CO2 24 22 - 32 mmol/L    Glucose, Bld 98 70 - 99 mg/dL      Comment: Glucose reference range applies only to samples taken after fasting for at least 8 hours.    BUN 6 (L) 8 - 23 mg/dL    Creatinine, Ser 0.73 0.61 - 1.24 mg/dL    Calcium 8.9 8.9 - 10.3 mg/dL    GFR, Estimated >60 >60 mL/min      Comment: (NOTE) Calculated using the CKD-EPI Creatinine Equation (2021)      Anion gap 11 5 - 15      Comment: Performed at [facility] [address], [place], [place] [address]  CBC     Status: Abnormal    Collection Time: 08/07/21  5:05 AM  Result Value Ref Range    WBC 12.7 (H) 4.0 - 10.5 K/uL    RBC 4.18 (L) 4.22 - 5.81 MIL/uL    Hemoglobin 12.6 (L) 13.0 - 17.0 g/dL    HCT 38.2 (L) 39.0 - 52.0 %    MCV 91.4 80.0 - 100.0 fL    MCH 30.1 26.0 - 34.0 pg    MCHC 33.0 30.0 - 36.0 g/dL    RDW 15.6 (H) 11.5 - 15.5 %    Platelets 210 150 - 400 K/uL    nRBC 0.0 0.0 - 0.2 %      Comment: Performed at [facility], [place] [address], [place], [place] [address]  Basic metabolic panel      Status: Abnormal    Collection Time: 08/08/21  3:37 AM  Result Value Ref Range    Sodium 134 (L) 135 - 145 mmol/L    Potassium 3.8 3.5 - 5.1 mmol/L    Chloride 102 98 - 111 mmol/L  CO2 26 22 - 32 mmol/L    Glucose, Bld 106 (H) 70 - 99 mg/dL      Comment: Glucose reference range applies only to samples taken after fasting for at least 8 hours.    BUN 8 8 - 23 mg/dL    Creatinine, Ser 0.74 0.61 - 1.24 mg/dL    Calcium 8.6 (L) 8.9 - 10.3 mg/dL    GFR, Estimated >60 >60 mL/min      Comment: (NOTE) Calculated using the CKD-EPI Creatinine Equation (2021)      Anion gap 6 5 - 15      Comment: Performed at [facility] [address] [place], [place] [address]  CBC     Status: Abnormal    Collection Time: 08/08/21  3:37 AM  Result Value Ref Range    WBC 11.1 (H) 4.0 - 10.5 K/uL    RBC 3.90 (L) 4.22 - 5.81 MIL/uL    Hemoglobin 11.9 (L) 13.0 - 17.0 g/dL    HCT 35.6 (L) 39.0 - 52.0 %    MCV 91.3 80.0 - 100.0 fL    MCH 30.5 26.0 - 34.0 pg    MCHC 33.4 30.0 - 36.0 g/dL    RDW 15.7 (H) 11.5 - 15.5 %    Platelets 210 150 - 400 K/uL    nRBC 0.0 0.0 - 0.2 %      Comment: Performed at [facility], [place] [address], [place], [place] [address]      Imaging Results (Last 48 hours)  No results found.         Blood pressure (!) 132/92, pulse 88, temperature 98.1 F (36.7 C), temperature source Oral, resp. rate 13, height 6' 0.99" (1.854 m), weight 79.8 kg, SpO2 94 %. Medical Problem List and Plan: 1. Functional deficits secondary to left AKA 08/06/2021 secondary to multilevel arterial occlusive disease with ischemic gangrenous changes  -patient may shower, cover incision  -ELOS/Goals: 10-12 Mod I to Supervision  -F/U with Vascular in 1 month 2.  Antithrombotics: -DVT/anticoagulation:  Pharmaceutical: Heparin  -antiplatelet therapy: N/A 3. Pain Management: Neurontin 100 mg 3 times daily, hydrocodone as needed  -Consider increase Neurontin for phantom pain 4. Mood: Emotional  support  -antipsychotic agents: N/A 5. Neuropsych: This patient is capable of making decisions on his own behalf. 6. Skin/Wound Care: Routine skin check 7. Fluids/Electrolytes/Nutrition: Routine in and outs with follow-up chemistries 8.  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  Status post stereotactic brain biopsy 05/23/2021.  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 Follow-up with Dr. Cecil Cobbs.  Decadron therapy as indicated, this is being tapered 9.  Seizure prophylaxis.  Keppra 500 mg twice daily. Denies recent seizure 10.  Hyperlipidemia.  Lipitor 11.  Tobacco abuse.  NicoDerm patch.  Counseling 12.  CAD/CABG.  No chest pain or shortness of breath 13. Hx of hypertension. He has renal artery narrowing. No home medications. Continue to monitor trend 14. History of CVA 15. Constipation  -on senokot, will add miralax, prn dulcolax supp   Jennye Boroughs, MD 08/09/2021   I have personally performed a face to face diagnostic evaluation of this patient and formulated the key components of the plan.  Additionally, I have personally reviewed laboratory data, imaging studies, as well as relevant notes and concur with the physician assistant's documentation above.  The patient's status has not changed from the original H&P.  Any changes in documentation from the acute care chart  have been noted above.  Jennye Boroughs, MD, [facility]

## 2021-08-09 NOTE — Progress Notes (Signed)
Inpatient Rehabilitation Admission Medication Review by a Pharmacist  A complete drug regimen review was completed for this patient to identify any potential clinically significant medication issues.  High Risk Drug Classes Is patient taking? Indication by Medication  Antipsychotic No   Anticoagulant Yes SQ heparin: VTE ppx  Antibiotic No   Opioid Yes Norco: PRN pain  Antiplatelet No   Hypoglycemics/insulin No   Vasoactive Medication No   Chemotherapy No   Other Yes Atorvastatin: HLD Dexamethasone: steroid Docusate, senna: constipation Gabapentin: neuropathy/pain Levetiracetam: seizure ppx Nicotine patch: tobacco cessation  Pantoprazole: GERD ppx     Type of Medication Issue Identified Description of Issue Recommendation(s)  Drug Interaction(s) (clinically significant)     Duplicate Therapy     Allergy     No Medication Administration End Date     Incorrect Dose     Additional Drug Therapy Needed     Significant med changes from prior encounter (inform family/care partners about these prior to discharge).    Other       Clinically significant medication issues were identified that warrant physician communication and completion of prescribed/recommended actions by midnight of the next day:  No   Pharmacist comments: n/a   Time spent performing this drug regimen review (minutes): 20   Thank you for allowing pharmacy to be a part of this patient's care.  Ardyth Harps, PharmD Clinical Pharmacist

## 2021-08-09 NOTE — Progress Notes (Signed)
Jennye Boroughs, MD  Physician Physical Medicine and Rehabilitation PMR Pre-admission    Signed Date of Service:  08/07/2021 12:34 PM  Related encounter: ED to Hosp-Admission (Discharged) from 08/05/2021 in [facility]      Show:Clear all '[x]' Written'[x]' Templated'[x]' Copied  Added by: '[x]' Cristina Gong, RN'[x]' Jennye Boroughs, MD  '[]' Hover for details                                                                                                                                                                                                                                                                                                                                                                                                                                                 PMR Admission Coordinator Pre-Admission Assessment   Patient: Roger Solis is an 66 y.o., male MRN: [id number] DOB: 12-25-1955 Height: 6' 0.99" (185.4 cm) Weight: 79.8 kg   Insurance Information HMO:     PPO:      PCP:      IPA:      80/20:      OTHER:  PRIMARY: [place]      Policy#: [id number]      Subscriber: pt CM Name: Sherlynn Carbon      Phone#: [phone]     Fax#: [phone] Pre-Cert#: [id number]      Employer:  Benefits:  Phone #: [phone]     Name: 6/5 Eff. Date: 07/2017     Deduct:       Out of Pocket Max:       Life Max:  CIR: per [facility] guidelines      SNF: per [facility] Outpatient: per [facility]    Co-Pay:  Home Health: per VA      Co-Pay:  DME: per [facility]     Co-Pay:  Providers: in network  SECONDARY:       Policy#:      Phone#:    Development worker, community:       Phone#:    The Engineer, petroleum" for patients in Inpatient Rehabilitation Facilities with attached "Privacy Act [place] Records" was provided  and verbally reviewed with: N/A   Emergency Contact Information Contact Information       Name Relation Home Work [place], [place] Daughter     [phone]         Current Medical History  Patient Admitting Diagnosis: AKA   History of Present Illness:  66 year old right-handed male with history of CAD/CABG, hyperlipidemia, tobacco abuse, PAD status post right femoral artery stent with chronic lower extremity ischemic changes, 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 Ellene Route and followed by Dr. 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  Presented 08/05/2021 after recent angiogram showed severe multilevel arterial occlusive disease of the left lower extremity as well as gangrenous changes.  Limb was not felt to be salvageable.  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 Deitra Mayo.  Hospital course pain management.  Subcutaneous heparin added for DVT prophylaxis.    Patient's medical record from [facility] has been reviewed by the rehabilitation admission coordinator and physician.   Past Medical History      Past Medical History:  Diagnosis Date   CAD (coronary artery disease)     History of brain cancer     HLD (hyperlipidemia)     Hypertension     PAD (peripheral artery disease) (HCC)      Has the patient had major surgery during 100 days prior to admission? Yes   Family History   family history includes Heart disease in his father and mother.   Current Medications   Current Facility-Administered Medications:    acetaminophen (TYLENOL) tablet 325-650 mg, 325-650 mg, Oral, Q6H PRN, Baglia, Corrina, PA-C   albuterol (PROVENTIL) (2.5 MG/3ML) 0.083% nebulizer solution 3 mL, 3 mL, Inhalation, Q6H PRN, Baglia, Corrina, PA-C   alum & mag hydroxide-simeth (MAALOX/MYLANTA) 200-200-20 MG/5ML  suspension 15-30 mL, 15-30 mL, Oral, Q2H PRN, Baglia, Corrina, PA-C   atorvastatin (LIPITOR) tablet 80 mg, 80 mg, Oral, Daily, Baglia, Corrina, PA-C, 80 mg at 08/09/21 0804   dexamethasone (DECADRON) tablet 1 mg, 1 mg, Oral, Daily, Baglia, Corrina, PA-C, 1 mg at 08/09/21 0932   docusate sodium (COLACE) capsule 100 mg, 100 mg, Oral, Daily, Baglia, Corrina, PA-C, 100 mg at 08/09/21 0803   gabapentin (NEURONTIN) capsule 100 mg, 100 mg, Oral, TID, Baglia, Corrina, PA-C, 100 mg at 08/09/21 0804   guaiFENesin-dextromethorphan (ROBITUSSIN DM) 100-10 MG/5ML syrup 15 mL, 15 mL, Oral, Q4H PRN, Baglia, Corrina, PA-C   heparin injection 5,000 Units, 5,000 Units, Subcutaneous, Q8H, Angelia Mould, MD, 5,000 Units at 08/09/21 0536   hydrALAZINE (APRESOLINE) injection 10 mg, 10 mg, Intravenous, Q20  Min PRN, Darliss Cheney, MD, 10 mg at 08/07/21 0340   HYDROcodone-acetaminophen (NORCO) 7.5-325 MG per tablet 1-2 tablet, 1-2 tablet, Oral, Q4H PRN, Baglia, Corrina, PA-C, 2 tablet at 08/09/21 0932   HYDROcodone-acetaminophen (NORCO/VICODIN) 5-325 MG per tablet 1-2 tablet, 1-2 tablet, Oral, Q4H PRN, Baglia, Corrina, PA-C, 2 tablet at 08/08/21 1338   labetalol (NORMODYNE) injection 10 mg, 10 mg, Intravenous, Q10 min PRN, Baglia, Corrina, PA-C   levETIRAcetam (KEPPRA) tablet 500 mg, 500 mg, Oral, BID, Baglia, Corrina, PA-C, 500 mg at 08/09/21 0804   magnesium sulfate IVPB 2 g 50 mL, 2 g, Intravenous, Daily PRN, Baglia, Corrina, PA-C   metoprolol tartrate (LOPRESSOR) injection 2-5 mg, 2-5 mg, Intravenous, Q2H PRN, Baglia, Corrina, PA-C   morphine (PF) 2 MG/ML injection 0.5-1 mg, 0.5-1 mg, Intravenous, Q2H PRN, Baglia, Corrina, PA-C, 1 mg at 08/09/21 0800   nicotine (NICODERM CQ - dosed in mg/24 hours) patch 21 mg, 21 mg, Transdermal, Daily, Pahwani, Ravi, MD, 21 mg at 08/09/21 0805   ondansetron (ZOFRAN) injection 4 mg, 4 mg, Intravenous, Q6H PRN, Baglia, Corrina, PA-C, 4 mg at 08/06/21 0340   pantoprazole  (PROTONIX) EC tablet 40 mg, 40 mg, Oral, Daily, Baglia, Corrina, PA-C, 40 mg at 08/09/21 0803   phenol (CHLORASEPTIC) mouth spray 1 spray, 1 spray, Mouth/Throat, PRN, Baglia, Corrina, PA-C   potassium chloride SA (KLOR-CON M) CR tablet 20-40 mEq, 20-40 mEq, Oral, Daily PRN, Baglia, Corrina, PA-C   senna-docusate (Senokot-S) tablet 1 tablet, 1 tablet, Oral, QHS PRN, Baglia, Corrina, PA-C, 1 tablet at 08/07/21 2033   Patients Current Diet:  Diet Order                  Diet regular Room service appropriate? Yes; Fluid consistency: Thin  Diet effective now                       Precautions / Restrictions Precautions Precautions: Fall Restrictions Weight Bearing Restrictions: Yes LLE Weight Bearing: Non weight bearing    Has the patient had 2 or more falls or a fall with injury in the past year? No   Prior Activity Level Community (5-7x/wk): Mod I with RW, some assist with adls   Prior Functional Level Self Care: Did the patient need help bathing, dressing, using the toilet or eating? Needed some help   Indoor Mobility: Did the patient need assistance with walking from room to room (with or without device)? Independent   Stairs: Did the patient need assistance with internal or external stairs (with or without device)? Independent   Functional Cognition: Did the patient need help planning regular tasks such as shopping or remembering to take medications? Independent   Patient Information Are you of Hispanic, Latino/a,or Spanish origin?: A. No, not of Hispanic, Latino/a, or Spanish origin What is your race?: A. White Do you need or want an interpreter to communicate with a doctor or health care staff?: 0. No   Patient's Response To:  Health Literacy and Transportation Is the patient able to respond to health literacy and transportation needs?: Yes Health Literacy - How often do you need to have someone help you when you read instructions, pamphlets, or other written material  from your doctor or pharmacy?: Never In the past 12 months, has lack of transportation kept you from medical appointments or from getting medications?: No In the past 12 months, has lack of transportation kept you from meetings, work, or from getting things needed for daily living?: No  Home Assistive Devices / Equipment Home Assistive Devices/Equipment: None Home Equipment: Conservation officer, nature (2 wheels), Rollator (4 wheels), Shower seat   Prior Device Use: Indicate devices/aids used by the patient prior to current illness, exacerbation or injury? RW   Current Functional Level Cognition   Overall Cognitive Status: Within Functional Limits for tasks assessed Orientation Level: Oriented X4    Extremity Assessment (includes Sensation/Coordination)   Upper Extremity Assessment: LUE deficits/detail LUE Deficits / Details: reports weaker from metastatic brain tumor; AROM WFL, strength grossly 4/5  Lower Extremity Assessment: Defer to PT evaluation LLE Deficits / Details: difficulty lifting leg due to pain, passively moves on his own with hip flexion but not formally tested due to pain     ADLs   Overall ADL's : Needs assistance/impaired Eating/Feeding: Independent, Sitting Grooming: Set up, Sitting Upper Body Bathing: Set up, Sitting Lower Body Bathing: Moderate assistance, Sit to/from stand Upper Body Dressing : Set up, Sitting Lower Body Dressing: Moderate assistance, Sit to/from stand Toilet Transfer: Minimal assistance, Ambulation, Rolling walker (2 wheels) Toilet Transfer Details (indicate cue type and reason): step hop with RW Toileting- Clothing Manipulation and Hygiene: Min guard, Sitting/lateral lean Functional mobility during ADLs: Minimal assistance, Rolling walker (2 wheels) General ADL Comments: min A for position changes, increased time and effort for all movement     Mobility   Overal bed mobility: Needs Assistance Bed Mobility: Sit to Supine Supine to sit: Mod  assist Sit to supine: Min guard General bed mobility comments: with HOB significantly  elevated     Transfers   Overall transfer level: Needs assistance Equipment used: Rolling walker (2 wheels) Transfers: Sit to/from Stand, Bed to chair/wheelchair/BSC Sit to Stand: Min assist Bed to/from chair/wheelchair/BSC transfer type:: Step pivot Step pivot transfers: Min assist General transfer comment: up from higher surface with cues for hand placement some lifting help and assist for balance, step/hop to recliner with RW with min/mod A for balance, walker safety and cues     Ambulation / Gait / Stairs / Wheelchair Mobility         Posture / Balance Dynamic Sitting Balance Sitting balance - Comments: on edge of bed needing to scoot back on R due to too close to edge with cue and S Balance Overall balance assessment: Needs assistance Sitting balance-Leahy Scale: Fair Sitting balance - Comments: on edge of bed needing to scoot back on R due to too close to edge with cue and S Standing balance support: Bilateral upper extremity supported Standing balance-Leahy Scale: Poor Standing balance comment: due to AKA on L     Special needs/care consideration Patient and daughter requested on acute assistance with advanced directives and health Care POA completion on 6/5    Previous Home Environment  Living Arrangements: Children  Lives With: Daughter Available Help at Discharge: Family, Available 24 hours/day Type of Home: House Home Layout: One level Home Access: Stairs to enter [employer] of Steps: 1&1 Bathroom Shower/Tub: Optometrist: No Home Care Services: No Additional Comments: Moved form [place] , [place] in Feb 2023 to live with daughter   Discharge Living Setting Plans for Discharge Living Setting: Lives with (comment) (daughter) Type of Home at Discharge: House Discharge Home Layout: One level Discharge Home Access:  Stairs to enter Entrance Stairs-Rails: None Entrance Stairs-Number of Steps: 1 and 1 Discharge Bathroom Shower/Tub: Tub/shower unit Discharge Bathroom Toilet: Standard Discharge Bathroom Accessibility: No Does the patient have any problems obtaining your medications?: No   Social/Family/Support  Systems Patient Roles: Parent Contact Information: daughter, [name] Anticipated Caregiver: daughter Anticipated Caregiver's Contact Information: see contacts Ability/Limitations of Caregiver: none Caregiver Availability: 24/7 Discharge Plan Discussed with Primary Caregiver: Yes Is Caregiver In Agreement with Plan?: Yes Does Caregiver/Family have Issues with Lodging/Transportation while Pt is in Rehab?: No   Goals Patient/Family Goal for Rehab: Mod I to intermittent supervision with PT and OT at wheelchair level Expected length of stay: ELOS 10 to 12 days Pt/Family Agrees to Admission and willing to participate: Yes Program Orientation Provided & Reviewed with Pt/Caregiver Including Roles  & Responsibilities: Yes   Decrease burden of Care through IP rehab admission: n/a   Possible need for SNF placement upon discharge: not anticipated   Patient Condition: I have reviewed medical records from [facility], spoken with CM, and patient and daughter. I met with patient at the bedside for inpatient rehabilitation assessment.  Patient will benefit from ongoing PT and OT, can actively participate in 3 hours of therapy a day 5 days of the week, and can make measurable gains during the admission.  Patient will also benefit from the coordinated team approach during an Inpatient Acute Rehabilitation admission.  The patient will receive intensive therapy as well as Rehabilitation physician, nursing, social worker, and care management interventions.  Due to bladder management, bowel management, safety, skin/wound care, disease management, medication administration, pain management, and patient education  the patient requires 24 hour a day rehabilitation nursing.  The patient is currently mod asisst overall with mobility and basic ADLs.  Discharge setting and therapy post discharge at home with home health is anticipated.  Patient has agreed to participate in the Acute Inpatient Rehabilitation Program and will admit today.   Preadmission Screen Completed By:  Cleatrice Burke, 08/09/2021 10:33 AM ______________________________________________________________________   Discussed status with Dr. Curlene Dolphin on 08/09/21 at 1034 and received approval for admission today.   Admission Coordinator:  Cleatrice Burke, RN, time 4098 Date 08/09/21    Assessment/Plan: Diagnosis: Does the need for close, 24 hr/day Medical supervision in concert with the patient's rehab needs make it unreasonable for this patient to be served in a less intensive setting? 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 Yes Does the patient require coordinated care of a physician, rehab nurse, PT, OT, and SLP to address physical and functional deficits in the context of the above medical diagnosis(es)? Yes Addressing deficits in the following areas: balance, endurance, locomotion, strength, transferring, bowel/bladder control, bathing, dressing, feeding, grooming, and toileting Can the patient actively participate in an intensive therapy program of at least 3 hrs of therapy 5 days a week? Yes The potential for patient to make measurable gains while on inpatient rehab is good Anticipated  functional outcomes upon discharge from inpatient rehab: modified independent and supervision PT, modified independent and supervision OT, n/a SLP Estimated rehab length of stay to reach the above functional goals is: 10-12 Anticipated discharge destination: Home 10. Overall Rehab/Functional Prognosis: good     MD Signature: Jennye Boroughs         Revision History  Note Details  Traci Sermon, MD File Time 08/09/2021 10:49 AM  Author Type Physician Status Signed  Last Editor Jennye Boroughs, MD Service Physical Medicine and [place] # [id number] Admit Date 08/09/2021

## 2021-08-09 NOTE — Discharge Summary (Signed)
PatientPhysician Discharge Summary  Roger Solis XLK:[id number] DOB: 02-01-1956 DOA: 08/05/2021  PCP: [facility]  Admit date: 08/05/2021 Discharge date: 08/09/2021 30 Day Unplanned Readmission Risk Score    Flowsheet Row ED to Hosp-Admission (Current) from 08/05/2021 in [place]  30 Day Unplanned Readmission Risk Score (%) 25.25 Filed at 08/09/2021 0801       This score is the patient's risk of an unplanned readmission within 30 days of being discharged (0 -100%). The score is based on dignosis, age, lab data, medications, orders, and past utilization.   Low:  0-14.9   Medium: 15-21.9   High: 22-29.9   Extreme: 30 and above          Admitted From: Home Disposition: CIR  Recommendations for Outpatient Follow-up:  Follow up with PCP in 1-2 weeks Please obtain BMP/CBC in one week Follow-up with vascular surgery in 1 month Please follow up with your PCP on the following pending results: Unresulted Labs (From admission, onward)     Start     Ordered   Signed and Held  CBC  (heparin)  Once,   R       Comments: Baseline for heparin therapy IF NOT ALREADY DRAWN.  Notify MD if PLT < 100 K.   Question:  Specimen collection method  Answer:  Lab=Lab collect   Signed and Held   Signed and Held  Creatinine, serum  (heparin)  Once,   R       Comments: Baseline for heparin therapy IF NOT ALREADY DRAWN.   Question:  Specimen collection method  Answer:  Lab=Lab collect   Signed and Held   Signed and Held  Comprehensive metabolic panel  Tomorrow morning,   R       Question:  Specimen collection method  Answer:  Lab=Lab collect   Signed and Held   Signed and Held  CBC with Differential/Platelet  Tomorrow morning,   R       Question:  Specimen collection method  Answer:  Lab=Lab collect   Signed and Held              Home Health: None Equipment/Devices: None  Discharge Condition: Stable CODE STATUS: Full code Diet recommendation:  Cardiac  Subjective: Seen and examined.  No complaints.  Pain fairly controlled.  Brief/Interim Summary: Roger Solis is a 66 y.o. 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   He was recently hospitalized him March with acute onset left-sided weakness of his arm, leg and face with CT head showing vasogenic edema and MRI concerning for tumor or brain abscess.  He was treated with IV dexamethasone and home Keppra with improvement in his weakness.  He had stereotactic biopsy of the right frontal lobe lesion which later was found to be radiation therapy induced brain necrosis.   Patient has been following with oncology Dr. Mickeal Skinner for worsening left leg weakness, numbness and pain with activity.  Oncology resumed him on Decadron 4 mg daily and patient had reported improvement in symptoms.   Has also been following with vascular surgery with recent CT angio showing multiple areas of hemodynamically significant vascular disease.  Had CT angio in April showing multilevel severe stenosis to bilateral lower extremity.   Family noticed  discoloration of the left heel with a blister that started to weep and eventually ruptured.  Patient having severe pain and discomfort.     In the ED, he was afebrile mildly hypertensive, tachycardic with leukocytosis of 19.6 K.  BMP is unremarkable.  He was admitted with the following.   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  All of which are chronic. -Narrowing of the infrarenal abdominal aorta by 50%. -  50% narrowing of the right renal artery.  Severe narrowing of the left  renal artery. -There is also distal right external iliac and common femoral artery stenosis to 60%.  Hemodynamically significant narrowing of the right popliteal artery.  Patent right femoral artery stent. -Has occluded left common iliac artery with near occlusion of the left external iliac artery.  Occluded left femoral artery and narrowing of left popliteal artery. -Dr. 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  Patient seen by PT OT, they recommended CIR. [facility] insurance authorization approved, patient is cleared by vascular surgery and he is going to be discharged to CIR today.  Pain fairly controlled.   History of CVA (cerebrovascular accident) Patient no longer on aspirin for unclear reasons.  Although suspect could be related to his metastatic brain disease.   HTN (hypertension): Does not appear to be on any medications PTA.  Currently blood pressure elevated, likely secondary to pain.  Will place on as needed hydralazine.   CAD (coronary artery disease) S/p CABG, PCI s/p DES -Asymptomatic -Continue atorvastatin.  Unclear why patient stopped aspirin though suspect could be related to his brain metastasis.   History of seizure Last admitted in March 2023 for seizure.  Continue home [place].   Lung cancer metastatic to brain [facility]) Follows with Dr. Mickeal Skinner with oncology.  Currently on tapering Decadron for left sided weakness due to vasogenic edema.  Continue home Decadron.  Discharge plan was discussed with patient and/or family member and they verbalized understanding and agreed with it.  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 [facility]) Active Problems:   Lung cancer metastatic to brain [facility])   History of seizure   CAD (coronary artery disease)   HTN (hypertension)   History of CVA (cerebrovascular accident)    Discharge Instructions   Allergies as of 08/09/2021   No Known  Allergies      Medication List     STOP taking these medications    cephALEXin 500 MG capsule Commonly known as: KEFLEX       TAKE these medications    albuterol 108 (90 Base) MCG/ACT inhaler Commonly known as: VENTOLIN HFA Inhale 1 puff into the lungs every 6 (six) hours as needed for shortness of breath.   atorvastatin 80 MG tablet Commonly known as: LIPITOR Take 80 mg by mouth daily.   dexamethasone 1 MG tablet Commonly known as: DECADRON Take 3 tablets (3 mg total) by mouth daily with breakfast for 7 days, THEN 2 tablets (2 mg total) daily with breakfast for 7 days, THEN 1 tablet (1 mg total) daily with breakfast for 7 days. Start taking on: Jul 24, 2021   gabapentin 100 MG capsule Commonly known as: NEURONTIN Take 100 mg by mouth 3 (three) times daily.   levETIRAcetam 500 MG tablet Commonly known as: KEPPRA Take 1 tablet (500 mg total) by  mouth 2 (two) times daily.   ondansetron 4 MG disintegrating tablet Commonly known as: ZOFRAN-ODT Take 1 tablet (4 mg total) by mouth every 8 (eight) hours as needed for nausea or vomiting.        Follow-up Information     [facility] Follow up in 1 week(s).   Contact information: [place] [address] [phone]                No Known Allergies  Consultations: Vascular surgery   Procedures/Studies: MR BRAIN W WO CONTRAST  Result Date: 07/16/2021 CLINICAL DATA:  CNS neoplasm, assess treatment response. Recent biopsy showing radiation necrosis. EXAM: MRI HEAD WITHOUT AND WITH CONTRAST TECHNIQUE: Multiplanar, multiecho pulse sequences of the brain and surrounding structures were obtained without and with intravenous contrast. CONTRAST:  50mL MULTIHANCE GADOBENATE DIMEGLUMINE 529 MG/ML IV SOLN COMPARISON:  05/19/2021 FINDINGS: Brain: Right frontal lesion showing irregular nodular enhancement and restricted diffusion. Dominant area of predominantly ring  enhancement measures up to 2.7 cm on 17:105, previously 2.5 cm. A contiguous, distinct thin walled enhancing component at the superior aspect of the lesion measures 13 mm, similar to before. There is a small posterior nodular component measuring 9 mm on 17:106, new from prior. Moderate regional vasogenic edema is improved. Rim enhancing lesion with similar characteristics in the superior left frontal lobe measuring 11 mm on 17:36, with mild adjacent vasogenic edema. No new lesion.  No infarct, hydrocephalus, or collection Vascular: Major flow voids and vascular enhancements are preserved Skull and upper cervical spine: Unremarkable remote craniotomy on the right. Sinuses/Orbits: Negative IMPRESSION: 1. Mildly increased area of enhancement and restricted diffusion at the site of radiation necrosis in the right cerebral hemisphere. Associated vasogenic edema is moderate and improved. 2. 9 mm high left frontal lesion with similar imaging characteristics also shows a mild size increase from prior. 3. No new lesion. Electronically Signed   By: Jorje Guild M.D.   On: 07/16/2021 07:57     Discharge Exam: Vitals:   08/09/21 0314 08/09/21 0715  BP: 133/69 (!) 163/77  Pulse: 87 86  Resp: 13 15  Temp: 98.1 F (36.7 C) 98.8 F (37.1 C)  SpO2: 96% 97%   Vitals:   08/08/21 1933 08/08/21 2315 08/09/21 0314 08/09/21 0715  BP: 135/68 (!) 179/81 133/69 (!) 163/77  Pulse:  75 87 86  Resp: 12 16 13 15   Temp: 98.1 F (36.7 C) 98.3 F (36.8 C) 98.1 F (36.7 C) 98.8 F (37.1 C)  TempSrc: Oral Oral Oral Oral  SpO2: 97% 96% 96% 97%  Weight:      Height:        General: Pt is alert, awake, not in acute distress Cardiovascular: RRR, S1/S2 +, no rubs, no gallops Respiratory: CTA bilaterally, no wheezing, no rhonchi Abdominal: Soft, NT, ND, bowel sounds + Extremities: Left AKA    The results of significant diagnostics from this hospitalization (including imaging, microbiology, ancillary and laboratory)  are listed below for reference.     Microbiology: Recent Results (from the past 240 hour(s))  Culture, blood (routine x 2)     Status: None (Preliminary result)   Collection Time: 08/05/21  7:56 PM   Specimen: BLOOD LEFT FOREARM  Result Value Ref Range Status   Specimen Description BLOOD LEFT FOREARM  Final   Special Requests   Final    BOTTLES DRAWN AEROBIC AND ANAEROBIC Blood Culture adequate volume   Culture   Final    NO GROWTH  4 DAYS Performed at [facility], [place] [address], [place], [place] [address]    Report Status PENDING  Incomplete  Culture, blood (routine x 2)     Status: None (Preliminary result)   Collection Time: 08/05/21  8:05 PM   Specimen: Left Antecubital; Blood  Result Value Ref Range Status   Specimen Description LEFT ANTECUBITAL  Final   Special Requests   Final    BOTTLES DRAWN AEROBIC AND ANAEROBIC Blood Culture adequate volume   Culture   Final    NO GROWTH 4 DAYS Performed at [facility], [place] [address], [place], [place] [address]    Report Status PENDING  Incomplete  Surgical pcr screen     Status: None   Collection Time: 08/06/21  7:30 AM   Specimen: Nasal Mucosa; Nasal Swab  Result Value Ref Range Status   MRSA, PCR NEGATIVE NEGATIVE Final   Staphylococcus aureus NEGATIVE NEGATIVE Final    Comment: (NOTE) The Xpert SA Assay (FDA approved for NASAL specimens in patients 67 years of age and older), is one component of a comprehensive surveillance program. It is not intended to diagnose infection nor to guide or monitor treatment. Performed at [facility], [place] [address] [place], [place] [address]      Labs: BNP (last 3 results) No results for input(s): BNP in the last 8760 hours. Basic Metabolic Panel: Recent Labs  Lab 08/05/21 1802 08/07/21 0505 08/08/21 0337  NA 135 134* 134*  K 4.0 4.0 3.8  CL 101 99 102  CO2 23 24 26   GLUCOSE 149* 98 106*  BUN 20 6* 8  CREATININE 0.67 0.73 0.74  CALCIUM 8.9 8.9 8.6*    Liver Function Tests: Recent Labs  Lab 08/05/21 1802  AST 24  ALT 21  ALKPHOS 63  BILITOT 0.4  PROT 6.4*  ALBUMIN 2.8*   No results for input(s): LIPASE, AMYLASE in the last 168 hours. No results for input(s): AMMONIA in the last 168 hours. CBC: Recent Labs  Lab 08/05/21 1802 08/06/21 0316 08/07/21 0505 08/08/21 0337  WBC 19.6* 15.9* 12.7* 11.1*  NEUTROABS 17.6*  --   --   --   HGB 12.3* 11.1* 12.6* 11.9*  HCT 37.6* 33.7* 38.2* 35.6*  MCV 94.7 93.9 91.4 91.3  PLT 222 195 210 210   Cardiac Enzymes: No results for input(s): CKTOTAL, CKMB, CKMBINDEX, TROPONINI in the last 168 hours. BNP: Invalid input(s): POCBNP CBG: No results for input(s): GLUCAP in the last 168 hours. D-Dimer No results for input(s): DDIMER in the last 72 hours. Hgb A1c No results for input(s): HGBA1C in the last 72 hours. Lipid Profile No results for input(s): CHOL, HDL, LDLCALC, TRIG, CHOLHDL, LDLDIRECT in the last 72 hours. Thyroid function studies No results for input(s): TSH, T4TOTAL, T3FREE, THYROIDAB in the last 72 hours.  Invalid input(s): FREET3 Anemia work up No results for input(s): VITAMINB12, FOLATE, FERRITIN, TIBC, IRON, RETICCTPCT in the last 72 hours. Urinalysis    Component Value Date/Time   COLORURINE YELLOW 08/05/2021 1802   APPEARANCEUR CLEAR 08/05/2021 1802   LABSPEC 1.028 08/05/2021 1802   PHURINE 5.0 08/05/2021 1802   GLUCOSEU NEGATIVE 08/05/2021 1802   HGBUR NEGATIVE 08/05/2021 1802   BILIRUBINUR NEGATIVE 08/05/2021 1802   KETONESUR NEGATIVE 08/05/2021 1802   PROTEINUR NEGATIVE 08/05/2021 1802   NITRITE NEGATIVE 08/05/2021 1802   LEUKOCYTESUR NEGATIVE 08/05/2021 1802   Sepsis Labs Invalid input(s): PROCALCITONIN,  WBC,  LACTICIDVEN Microbiology Recent Results (from the past  240 hour(s))  Culture, blood (routine x 2)     Status: None (Preliminary result)   Collection Time: 08/05/21  7:56 PM   Specimen: BLOOD LEFT FOREARM  Result Value Ref Range Status    Specimen Description BLOOD LEFT FOREARM  Final   Special Requests   Final    BOTTLES DRAWN AEROBIC AND ANAEROBIC Blood Culture adequate volume   Culture   Final    NO GROWTH 4 DAYS Performed at [facility], [place] [address], [place], [place] [address]    Report Status PENDING  Incomplete  Culture, blood (routine x 2)     Status: None (Preliminary result)   Collection Time: 08/05/21  8:05 PM   Specimen: Left Antecubital; Blood  Result Value Ref Range Status   Specimen Description LEFT ANTECUBITAL  Final   Special Requests   Final    BOTTLES DRAWN AEROBIC AND ANAEROBIC Blood Culture adequate volume   Culture   Final    NO GROWTH 4 DAYS Performed at [facility], [place] [address], [place], [place] [address]    Report Status PENDING  Incomplete  Surgical pcr screen     Status: None   Collection Time: 08/06/21  7:30 AM   Specimen: Nasal Mucosa; Nasal Swab  Result Value Ref Range Status   MRSA, PCR NEGATIVE NEGATIVE Final   Staphylococcus aureus NEGATIVE NEGATIVE Final    Comment: (NOTE) The Xpert SA Assay (FDA approved for NASAL specimens in patients 5 years of age and older), is one component of a comprehensive surveillance program. It is not intended to diagnose infection nor to guide or monitor treatment. Performed at [facility], [place] [address], [place], [place] [address]      Time coordinating discharge: Over 30 minutes  SIGNED:   Darliss Cheney, MD  [facility] 08/09/2021, 10:39 AM *Please note that this is a verbal dictation therefore any spelling or grammatical errors are due to the "[place] [employer]" system interpretation. If 7PM-7AM, please contact night-coverage [email or web address]

## 2021-08-09 NOTE — Progress Notes (Signed)
Inpatient Rehabilitation Admissions Coordinator   I met with patient at bedside and discussed with him that yes I was admitting him today to CIR, but he needed to work with therapy here on acute every chance he could. Noted he declined OT today and PT yesterday. I have CIR bed and will make th arrangements to admit today.  Danne Baxter, RN, MSN Rehab Admissions Coordinator [phone] 08/09/2021 10:31 AM

## 2021-08-09 NOTE — Progress Notes (Signed)
   VASCULAR SURGERY ASSESSMENT & PLAN:   POD 2 LEFT ABOVE-THE-KNEE AMPUTATION: His amputation site looks fine.  Continue daily dressing changes.  DVT PROPHYLAXIS: He is on subcu heparin.  DISPOSITION: Sounds like he may have a bed in the next day or 2.   SUBJECTIVE:   Pain well controlled.  PHYSICAL EXAM:   Vitals:   08/08/21 1933 08/08/21 2315 08/09/21 0314 08/09/21 0715  BP: 135/68 (!) 179/81 133/69 (!) 163/77  Pulse:  75 87 86  Resp: 12 16 13 15   Temp: 98.1 F (36.7 C) 98.3 F (36.8 C) 98.1 F (36.7 C) 98.8 F (37.1 C)  TempSrc: Oral Oral Oral Oral  SpO2: 97% 96% 96% 97%  Weight:      Height:       I changed his dressing.  His AKA is healing well.  LABS:   Lab Results  Component Value Date   WBC 11.1 (H) 08/08/2021   HGB 11.9 (L) 08/08/2021   HCT 35.6 (L) 08/08/2021   MCV 91.3 08/08/2021   PLT 210 08/08/2021   Lab Results  Component Value Date   CREATININE 0.74 08/08/2021   Lab Results  Component Value Date   INR 0.9 06/17/2021   CBG (last 3)  No results for input(s): GLUCAP in the last 72 hours.  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

## 2021-08-09 NOTE — Progress Notes (Signed)
Patient arrived on unit. Rehab schedule, medications and plan of care reviewed, patient states an understanding. Patient is Ax4 and has no complications noted at this time. Patient reports pain 7 out of 10 PRN pain medication given(see MAR). Patient educated on use of call light. Dayna Ramus

## 2021-08-10 ENCOUNTER — Encounter: Payer: No Typology Code available for payment source | Admitting: Vascular Surgery

## 2021-08-10 ENCOUNTER — Encounter (HOSPITAL_COMMUNITY): Payer: No Typology Code available for payment source

## 2021-08-10 DIAGNOSIS — Z89612 Acquired absence of left leg above knee: Secondary | ICD-10-CM | POA: Diagnosis not present

## 2021-08-10 LAB — COMPREHENSIVE METABOLIC PANEL
ALT: 25 U/L (ref 0–44)
AST: 29 U/L (ref 15–41)
Albumin: 2.2 g/dL — ABNORMAL LOW (ref 3.5–5.0)
Alkaline Phosphatase: 48 U/L (ref 38–126)
Anion gap: 11 (ref 5–15)
BUN: 11 mg/dL (ref 8–23)
CO2: 24 mmol/L (ref 22–32)
Calcium: 8.5 mg/dL — ABNORMAL LOW (ref 8.9–10.3)
Chloride: 98 mmol/L (ref 98–111)
Creatinine, Ser: 0.64 mg/dL (ref 0.61–1.24)
GFR, Estimated: >60 mL/min (ref >60)
Glucose, Bld: 107 mg/dL — ABNORMAL HIGH (ref 70–99)
Potassium: 3.5 mmol/L (ref 3.5–5.1)
Sodium: 133 mmol/L — ABNORMAL LOW (ref 135–145)
Total Bilirubin: 0.3 mg/dL (ref 0.3–1.2)
Total Protein: 5.6 g/dL — ABNORMAL LOW (ref 6.5–8.1)

## 2021-08-10 LAB — CBC WITH DIFFERENTIAL/PLATELET
Abs Immature Granulocytes: 0.14 10*3/uL — ABNORMAL HIGH (ref 0.00–0.07)
Basophils Absolute: 0 10*3/uL (ref 0.0–0.1)
Basophils Relative: 0 %
Eosinophils Absolute: 0.1 10*3/uL (ref 0.0–0.5)
Eosinophils Relative: 1 %
HCT: 34.9 % — ABNORMAL LOW (ref 39.0–52.0)
Hemoglobin: 11.5 g/dL — ABNORMAL LOW (ref 13.0–17.0)
Immature Granulocytes: 1 %
Lymphocytes Relative: 10 %
Lymphs Abs: 1 10*3/uL (ref 0.7–4.0)
MCH: 30.2 pg (ref 26.0–34.0)
MCHC: 33 g/dL (ref 30.0–36.0)
MCV: 91.6 fL (ref 80.0–100.0)
Monocytes Absolute: 1 10*3/uL (ref 0.1–1.0)
Monocytes Relative: 9 %
Neutro Abs: 8.5 10*3/uL — ABNORMAL HIGH (ref 1.7–7.7)
Neutrophils Relative %: 79 %
Platelets: 231 10*3/uL (ref 150–400)
RBC: 3.81 MIL/uL — ABNORMAL LOW (ref 4.22–5.81)
RDW: 15.8 % — ABNORMAL HIGH (ref 11.5–15.5)
WBC: 10.8 10*3/uL — ABNORMAL HIGH (ref 4.0–10.5)
nRBC: 0 % (ref 0.0–0.2)

## 2021-08-10 LAB — CULTURE, BLOOD (ROUTINE X 2)
Culture: NO GROWTH
Culture: NO GROWTH
Special Requests: ADEQUATE
Special Requests: ADEQUATE

## 2021-08-10 LAB — VITAMIN D 25 HYDROXY (VIT D DEFICIENCY, FRACTURES): Vit D, 25-Hydroxy: 14.7 ng/mL — ABNORMAL LOW (ref 30–100)

## 2021-08-10 LAB — MAGNESIUM: Magnesium: 1.7 mg/dL (ref 1.7–2.4)

## 2021-08-10 MED ORDER — ONDANSETRON HCL 4 MG PO TABS
4.0000 mg | ORAL_TABLET | Freq: Three times a day (TID) | ORAL | Status: DC | PRN
Start: 2021-08-10 — End: 2021-08-20
  Administered 2021-08-10: 4 mg via ORAL
  Filled 2021-08-10: qty 1

## 2021-08-10 MED ORDER — GABAPENTIN 100 MG PO CAPS
200.0000 mg | ORAL_CAPSULE | Freq: Three times a day (TID) | ORAL | Status: DC
  Administered 2021-08-10 – 2021-08-12 (×6): 200 mg via ORAL
  Filled 2021-08-10 (×6): qty 2

## 2021-08-10 MED ORDER — VITAMIN D (ERGOCALCIFEROL) 1.25 MG (50000 UNIT) PO CAPS
50000.0000 [IU] | ORAL_CAPSULE | ORAL | Status: DC
  Administered 2021-08-10 – 2021-08-17 (×2): 50000 [IU] via ORAL
  Filled 2021-08-10 (×2): qty 1

## 2021-08-10 NOTE — Progress Notes (Signed)
Inpatient [place] Individual Statement of Services  Patient Name:  Roger Solis  Date:  08/10/2021  Welcome to the [facility].  Our goal is to provide you with an individualized program based on your diagnosis and situation, designed to meet your specific needs.  With this comprehensive rehabilitation program, you will be expected to participate in at least 3 hours of rehabilitation therapies Monday-Friday, with modified therapy programming on the weekends.  Your rehabilitation program will include the following services:  Physical Therapy (PT), Occupational Therapy (OT), Speech Therapy (ST), 24 hour per day rehabilitation nursing, Therapeutic Recreaction (TR), Neuropsychology, Care Coordinator, Rehabilitation Medicine, Nutrition Services, Pharmacy Services, and Other  Weekly team conferences will be held on Wednesdays to discuss your progress.  Your Inpatient Rehabilitation Care Coordinator will talk with you frequently to get your input and to update you on team discussions.  Team conferences with you and your family in attendance may also be held.  Expected length of stay: 10-12 Days  Overall anticipated outcome:  MOD I to Intermittent Supervision  Depending on your progress and recovery, your program may change. Your Inpatient Rehabilitation Care Coordinator will coordinate services and will keep you informed of any changes. Your Inpatient Rehabilitation Care Coordinator's name and contact numbers are listed  below.  The following services may also be recommended but are not provided by the [facility]:   [place] will be made to provide these services after discharge if needed.  Arrangements include referral to agencies that provide these services.  Your insurance has been verified to be:   [place] Your primary doctor is:  [place]  Pertinent  information will be shared with your doctor and your insurance company.  Inpatient Rehabilitation Care Coordinator:  Erlene Quan, [place] or [phone]  Information discussed with and copy given to patient by: Dyanne Iha, 08/10/2021, 12:17 PM

## 2021-08-10 NOTE — Evaluation (Signed)
Physical Therapy Assessment and Plan  Patient Details  Name: Roger Solis MRN: [id number] Date of Birth: [birth year]  PT Diagnosis: Abnormal posture, Abnormality of gait, Difficulty walking, Edema, Impaired sensation, Muscle weakness, and Pain in L residual limb Rehab Potential: Good ELOS: 7-10 days   Today's Date: 08/10/2021 PT Individual Time: 8366-2947 PT Individual Time Calculation (min): 57 min   Today's Date: 08/10/2021 PT Missed Time: 18 Minutes Missed Time Reason: Other (Comment) (pt off unit with daughter during scheduled eval time)   Hospital Problem: Principal Problem:   Left above-knee amputee [facility])   Past Medical History:  Past Medical History:  Diagnosis Date   CAD (coronary artery disease)    History of brain cancer    HLD (hyperlipidemia)    Hypertension    PAD (peripheral artery disease) ([place])    Past Surgical History:  Past Surgical History:  Procedure Laterality Date   AMPUTATION Left 08/06/2021   Procedure: LEFTAMPUTATION ABOVE KNEE;  Surgeon: Angelia Mould, MD;  Location: [place];  Service: Vascular;  Laterality: Left;   APPLICATION OF CRANIAL NAVIGATION N/A 05/23/2021   Procedure: APPLICATION OF CRANIAL NAVIGATION;  Surgeon: Kristeen Miss, MD;  Location: [place];  Service: Neurosurgery;  Laterality: N/A;   double bypass  2012   FRAMELESS  BIOPSY WITH BRAINLAB N/A 05/23/2021   Procedure: Stereotactic needle biopsy of the brain w/brainlab;  Surgeon: Kristeen Miss, MD;  Location: [place];  Service: Neurosurgery;  Laterality: N/A;    Assessment & Plan Clinical Impression: Patient is a 66 y.o. 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 Ellene Route and followed by Dr. 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 He was noted to have left sided weakness that patient reports had improved with dexamethasone. Per chart review patient lives with his daughter.  1 level home one-step to entry.  Daughter does assist with some ADLs.  Patient was using a walker.  Presented 08/05/2021 after recent angiogram showed severe multilevel arterial occlusive disease of the left lower extremity as well as gangrenous changes.  Limb was not felt to be salvageable.  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 Deitra Mayo.  Hospital course pain management.  Subcutaneous heparin added for DVT prophylaxis.  Therapy evaluations completed due to patient decreased functional mobility was admitted for a comprehensive rehab program. Pt reports constipation and reports he has not had a BM in a few days. He reports he only is able to walk short distances for the past couple years.  He has continued pain at residual limb and also phantom pain, medications are helping to keep this under control.   Patient currently requires min with mobility secondary to muscle weakness, decreased cardiorespiratoy endurance, and decreased standing balance, decreased postural control, decreased balance strategies, and difficulty maintaining precautions.  Prior to hospitalization, patient was modified independent  with mobility and lived with Daughter in a House home.  Home access is 2 STEStairs to enter.  Patient will benefit from skilled PT intervention to maximize safe functional mobility, minimize fall risk, and decrease caregiver burden for planned discharge home with 24 hour supervision.  Anticipate patient will benefit from follow up [place] at discharge.  PT - End of Session Activity Tolerance: Tolerates 30+ min activity  with multiple rests Endurance Deficit: Yes Endurance Deficit Description: required rest breaks throughout PT Assessment Rehab Potential (ACUTE/IP ONLY):  Good PT Barriers to Discharge: Inaccessible home environment;Home environment access/layout;Wound Care;Weight bearing restrictions;Other (comments) PT Barriers to Discharge Comments: pain, NWB LLE, 2 STE with no railings PT Patient demonstrates impairments in the following area(s): Balance;Edema;Endurance;Motor;Nutrition;Pain;Perception;Sensory;Skin Integrity PT Transfers Functional Problem(s): Bed Mobility;Bed to Chair;Car;Furniture PT Locomotion Functional Problem(s): Ambulation;Wheelchair Mobility;Stairs PT Plan PT Intensity: Minimum of 1-2 x/day ,45 to 90 minutes PT Frequency: 5 out of 7 days PT Duration Estimated Length of Stay: 7-10 days PT Treatment/Interventions: Ambulation/gait training;Discharge planning;Functional mobility training;Psychosocial support;Therapeutic Activities;Visual/perceptual remediation/compensation;Balance/vestibular training;Disease management/prevention;Neuromuscular re-education;Skin care/wound management;Therapeutic Exercise;Wheelchair propulsion/positioning;Cognitive remediation/compensation;DME/adaptive equipment instruction;Pain management;Splinting/orthotics;UE/LE Strength taining/ROM;Community reintegration;Patient/family education;Stair training;UE/LE Coordination activities PT Transfers Anticipated Outcome(s): supervision with LRAD PT Locomotion Anticipated Outcome(s): supervision with LRAD PT Recommendation Follow Up Recommendations: Home health PT Patient destination: Home Equipment Recommended: To be determined Equipment Details: has standard walker and transport chair   PT Evaluation Precautions/Restrictions Precautions Precautions: Fall Restrictions Weight Bearing Restrictions: Yes LLE Weight Bearing: Non weight bearing Pain Interference Pain Interference Pain Effect on Sleep: 4. Almost constantly Pain Interference with Therapy Activities: 1. Rarely or not at all Pain Interference with Day-to-Day Activities: 4. Almost constantly Home  Living/Prior Functioning Home Living Available Help at Discharge: Family;Available 24 hours/day Type of Home: House Home Access: Stairs to enter [employer] of Steps: 2 STE Entrance Stairs-Rails: None Home Layout: One level Bathroom Shower/Tub: Chiropodist: Standard Bathroom Accessibility: Yes Additional Comments: pt has not been ambulatory for past month  Lives With: Daughter Prior Function Level of Independence: Requires assistive device for independence;Needs assistance with ADLs Dressing: Minimal  Able to Take Stairs?: No (not for past month or so) Driving: No (hasn't driven since march) 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 Pt reports numbness/tingling in RLE and phantom pain along L residual limb Coordination Gross Motor Movements are Fluid and Coordinated: No Fine Motor Movements are Fluid and Coordinated: Yes Coordination and Movement Description: grossly uncoordinated due to pain, altered balance strategies 2/2 L AKA, and generalzied weakness/deconditioning Finger Nose Finger Test: [facility] but slow Heel Shin Test: unable to perform on LLE due to AKA Motor  Motor Motor: Abnormal postural alignment and control Motor - Skilled Clinical Observations: grossly uncoordinated due to pain, altered balance strategies 2/2 L AKA, and generalized weakness/deconditioning  Trunk/Postural Assessment  Cervical  Assessment Cervical Assessment: Within Functional Limits Thoracic Assessment Thoracic Assessment: Exceptions to [facility] (mild kyphosis) Lumbar Assessment Lumbar Assessment: Exceptions to [facility] (posterior pelvic tilt in sitting) Postural Control Postural Control: Deficits on evaluation Protective Responses: delayed due to L AKA  Balance Balance Balance Assessed: Yes Static Sitting Balance Static Sitting - Balance Support: Bilateral upper extremity supported;Feet supported Static Sitting - Level of Assistance: 5: Stand by assistance (supervision) Dynamic Sitting Balance Dynamic Sitting - Balance Support: Feet supported;No upper extremity supported Dynamic Sitting - Level of Assistance: 5: Stand by assistance (supervision) Static Standing Balance Static Standing - Balance Support: Bilateral upper extremity supported (standard walker) Static Standing - Level of Assistance: 5: Stand by assistance (CGA) Dynamic Standing Balance Dynamic  Standing - Balance Support: Bilateral upper extremity supported;During functional activity (standard walker) Dynamic Standing - Level of Assistance: 4: Min assist Dynamic Standing - Comments: with transfers Extremity Assessment  RLE Assessment RLE Assessment: Exceptions to [facility] RLE Strength Right Hip Flexion: 4-/5 Right Hip ABduction: 4-/5 Right Hip ADduction: 4-/5 Right Knee Flexion: 4/5 Right Knee Extension: 4/5 Right Ankle Dorsiflexion: 4/5 Right Ankle Plantar Flexion: 4/5 LLE Assessment LLE Assessment: Exceptions to [facility] General Strength Comments: limited by pain LLE Strength Left Hip Flexion: 3+/5 Left Hip ABduction: 3+/5 Left Hip ADduction: 3+/5  Care Tool Care Tool Bed Mobility Roll left and right activity   Roll left and right assist level: Supervision/Verbal cueing    Sit to lying activity   Sit to lying assist level: Supervision/Verbal cueing    Lying to sitting on side of bed activity         Care Tool Transfers Sit to stand  transfer   Sit to stand assist level: Minimal Assistance - Patient > 75%    Chair/bed transfer   Chair/bed transfer assist level: Minimal Assistance - Patient > 75%     Physiological scientist transfer assist level: Minimal Assistance - Patient > 75%      Care Tool Locomotion Ambulation Ambulation activity did not occur: Refused        Walk 10 feet activity Walk 10 feet activity did not occur: Refused       Walk 50 feet with 2 turns activity Walk 50 feet with 2 turns activity did not occur: Refused      Walk 150 feet activity Walk 150 feet activity did not occur: Refused      Walk 10 feet on uneven surfaces activity Walk 10 feet on uneven surfaces activity did not occur: Refused      Stairs Stair activity did not occur: Safety/medical concerns (pain, generalized weakness/deconditioning)        Walk up/down 1 step activity Walk up/down 1 step or curb (drop down) activity did not occur: Safety/medical concerns (pain, generalized weakness/deconditioning)      Walk up/down 4 steps activity Walk up/down 4 steps activity did not occur: Safety/medical concerns (pain, generalized weakness/deconditioning)      Walk up/down 12 steps activity Walk up/down 12 steps activity did not occur: Safety/medical concerns (pain, generalized weakness/deconditioning)      Pick up small objects from floor Pick up small object from the floor (from standing position) activity did not occur: Safety/medical concerns (pain, generalized weakness/deconditioning, decreased balance)      Wheelchair Is the patient using a wheelchair?: Yes Type of Wheelchair: Manual   Wheelchair assist level: Supervision/Verbal cueing Max wheelchair distance: >130f  Wheel 50 feet with 2 turns activity   Assist Level: Supervision/Verbal cueing  Wheel 150 feet activity   Assist Level: Supervision/Verbal cueing    Refer to Care Plan for Long Term Goals  SHORT TERM GOAL WEEK 1 PT Short Term  Goal 1 (Week 1): STG=LTG due to LOS  Recommendations for other services: None   Skilled Therapeutic Intervention Evaluation completed (see details above and below) with education on PT POC and goals and individual treatment initiated with focus on functional mobility/transfers, generalized strengthening and endurance, and dynamic standing balance/coordination. Upon entering room, pt not present and per nursing daughter took pt outside. Therapist called daughter and asked to return pt to unit for scheduled evaluation. Received pt returning to unit with daughter. Pt educated on PT evaluation,  CIR policies, and therapy schedule and agreeable. Pt reported pain 7/10 in L resiudal limb (premedicated) and nausea - RN notified and administered medication at end of session. Provided daughter with home measurement sheet and pt performed WC mobility >389f using BUE and supervision to ortho gym - min cues to avoid running into obstacles on L side. Pt performed simulated car transfer with RW and min A but reported not liking RW and prefers standard walker. [facility] and [facility] WC<>bed stand<>pivot with standard walker and min A and sit<>supine with supervision. Wrapping fell off residual limb during transfer and RN notified to come inspect wound before therapist re-dressed it - incision appears to be healing well. Re-wrapped limb with gauze and ace wrap and notified MD of request for shrinker. Concluded session with pt semi-reclined in bed, needs within reach, and bed alarm on. 18 minutes missed of skilled physical therapy due to being off unit with daughter without grounds pass.   Mobility Bed Mobility Bed Mobility: Rolling Left;Sit to Supine Rolling Left: Supervision/Verbal cueing Sit to Supine: Supervision/Verbal cueing Transfers Transfers: Sit to Stand;Stand Pivot Transfers Sit to Stand: Minimal Assistance - Patient > 75% Stand Pivot Transfers: Minimal Assistance -  Patient > 75% Stand Pivot Transfer Details: Verbal cues for technique Stand Pivot Transfer Details (indicate cue type and reason): verbal cues to avoid excessive force when "hopping" on RLE Transfer (Assistive device): Standard walker Locomotion  Gait Ambulation: No Gait Gait: No Stairs / Additional Locomotion Stairs: No Wheelchair Mobility Wheelchair Mobility: Yes Wheelchair Assistance: SChartered loss adjuster Both upper extremities Wheelchair Parts Management: Needs assistance Distance: >1558f  Discharge Criteria: Patient will be discharged from PT if patient refuses treatment 3 consecutive times without medical reason, if treatment goals not met, if there is a change in medical status, if patient makes no progress towards goals or if patient is discharged from hospital.  The above assessment, treatment plan, treatment alternatives and goals were discussed and mutually agreed upon: by patient and by family  AnAlfonse AlpersT, DPT  08/10/2021, 12:13 PM

## 2021-08-10 NOTE — Progress Notes (Signed)
Inpatient Rehab Admissions Coordinator:   Auth information for CIR admit:  PRIMARY: Roger Solis      Policy#: [id number]      Subscriber: pt CM Name: Sherlynn Carbon      Phone#: [phone]     Fax#: [phone] Pre-Cert#: [id number] [place] for CIR from [place] with [employer] with updates due to fax listed above on day 30.   Shann Medal, PT, DPT Admissions Coordinator [phone] 08/10/21  4:30 PM

## 2021-08-10 NOTE — Progress Notes (Signed)
Inpatient Rehabilitation  Patient information reviewed and entered into eRehab system by Manya Balash M. Brok Stocking, M.A., CCC/SLP, PPS Coordinator.  Information including medical coding, functional ability and quality indicators will be reviewed and updated through discharge.    

## 2021-08-10 NOTE — Plan of Care (Signed)
  Problem: RH Balance Goal: LTG: Patient will maintain dynamic sitting balance (OT) Description: LTG:  Patient will maintain dynamic sitting balance with assistance during activities of daily living (OT) Flowsheets (Taken 08/10/2021 1628) LTG: Pt will maintain dynamic sitting balance during ADLs with: Independent Goal: LTG Patient will maintain dynamic standing with ADLs (OT) Description: LTG:  Patient will maintain dynamic standing balance with assist during activities of daily living (OT)  Flowsheets (Taken 08/10/2021 1628) LTG: Pt will maintain dynamic standing balance during ADLs with: Independent with assistive device

## 2021-08-10 NOTE — Progress Notes (Signed)
Orthopedic Tech Progress Note Patient Details:  Roger Solis [birth year] [id number]  Order for an AK shrinker called into [facility] @ 1215.  Patient ID: Roger Solis, male   DOB: Dec 25, 1955, 66 y.o.   MRN: [id number]  Roger Solis 08/10/2021, 12:19 PM

## 2021-08-10 NOTE — Progress Notes (Signed)
PROGRESS NOTE   Subjective/Complaints: Complains of phantom limb pain, discussed increasing gabapentin to 200mg  TID and he is agreeable Has no other complaints Reviewed labs with him and daughter  ROS: +phantom limb pain   Objective:   No results found. Recent Labs    08/08/21 0337 08/10/21 0533  WBC 11.1* 10.8*  HGB 11.9* 11.5*  HCT 35.6* 34.9*  PLT 210 231   Recent Labs    08/08/21 0337 08/10/21 0533  NA 134* 133*  K 3.8 3.5  CL 102 98  CO2 26 24  GLUCOSE 106* 107*  BUN 8 11  CREATININE 0.74 0.64  CALCIUM 8.6* 8.5*    Intake/Output Summary (Last 24 hours) at 08/10/2021 1216 Last data filed at 08/10/2021 0930 Gross per 24 hour  Intake 360 ml  Output 1150 ml  Net -790 ml        Physical Exam: Vital Signs Blood pressure (!) 147/67, pulse 85, temperature 98.6 F (37 C), temperature source Oral, resp. rate 15, height 6' (1.829 m), weight 74 kg, SpO2 97 %. Gen: no distress, normal appearing HEENT: R crani incision well healed, EOMI, sclera anicteric, oral mucosa pink and moist, hard of hearing Cardio: Reg rate Chest: normal effort, normal rate of breathing Extremities: R AKA, covered in clean and dry dressing Psych: Pt's affect is appropriate. Pt is cooperative Skin: Clean and intact without signs of breakdown Neuro:  Alert and oriented x3, CN 2-12 intact other than mild facial weakness, follows commands, speech and language appears to be intact, mild L facial weakness noted(chronic), BL UE strength 5/5, RLE 4+/5 Hip flex, knee extension 5/5, Ankle PF and DF 4+/5, not able to lift L residual limb, sensation decreased in stocking glove distribution RLE, mild intention tremor noted Musculoskeletal:  No joint swelling or tenderness noted, R AKA with clean and dry dressing, tender to palpation around incision   Assessment/Plan: 1. Functional deficits which require 3+ hours per day of interdisciplinary  therapy in a comprehensive inpatient rehab setting. Physiatrist is providing close team supervision and 24 hour management of active medical problems listed below. Physiatrist and rehab team continue to assess barriers to discharge/monitor patient progress toward functional and medical goals  Care Tool:  Bathing              Bathing assist       Upper Body Dressing/Undressing Upper body dressing        Upper body assist      Lower Body Dressing/Undressing Lower body dressing            Lower body assist       Toileting Toileting    Toileting assist       Transfers Chair/bed transfer  Transfers assist     Chair/bed transfer assist level: Minimal Assistance - Patient > 75%     Locomotion Ambulation   Ambulation assist   Ambulation activity did not occur: Refused          Walk 10 feet activity   Assist  Walk 10 feet activity did not occur: Refused        Walk 50 feet activity   Assist Walk 50 feet with 2 turns  activity did not occur: Refused         Walk 150 feet activity   Assist Walk 150 feet activity did not occur: Refused         Walk 10 feet on uneven surface  activity   Assist Walk 10 feet on uneven surfaces activity did not occur: Refused         Wheelchair     Assist Is the patient using a wheelchair?: Yes Type of Wheelchair: Manual    Wheelchair assist level: Supervision/Verbal cueing Max wheelchair distance: >12ft    Wheelchair 50 feet with 2 turns activity    Assist        Assist Level: Supervision/Verbal cueing   Wheelchair 150 feet activity     Assist      Assist Level: Supervision/Verbal cueing   Blood pressure (!) 147/67, pulse 85, temperature 98.6 F (37 C), temperature source Oral, resp. rate 15, height 6' (1.829 m), weight 74 kg, SpO2 97 %.  Medical Problem List and Plan: 1. Functional deficits secondary to left AKA 08/06/2021 secondary to multilevel arterial occlusive  disease with ischemic gangrenous changes             -patient may shower, cover incision             -ELOS/Goals: 10-12 Mod I to Supervision             -F/U with Vascular in 1 month  Grounds pass ordered. 2.  Antithrombotics: -DVT/anticoagulation:  Pharmaceutical: Heparin             -antiplatelet therapy: N/A 3. Phantom limb pain: Increase Neurontin to 200 mg 3 times daily, hydrocodone as needed 4. Mood: Emotional support             -antipsychotic agents: N/A 5. Neuropsych: This patient is capable of making decisions on his own behalf. 6. Skin/Wound Care: Routine skin check 7. Fluids/Electrolytes/Nutrition: Routine in and outs with follow-up chemistries 8.  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  Status post stereotactic brain biopsy 05/23/2021.  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 Follow-up with Dr. Cecil Cobbs.  Decadron therapy as indicated, this is being tapered 9.  Seizure prophylaxis.  Keppra 500 mg twice daily. Denies recent seizure 10.  Hyperlipidemia.  Lipitor 11.  Tobacco abuse.  NicoDerm patch.  Counseling 12.  CAD/CABG.  No chest pain or shortness of breath 13. Hx of hypertension. He has renal artery narrowing. No home medications. Continue to monitor trend 14. History of CVA 15. Constipation             -on senokot, will add miralax, prn dulcolax supp 16. Leukocytosis: discussed improving, repeat Monday 17. Severe vitamin D deficiency level 14: start ergocalciferol 50,000U once per week for 7 weeks.     LOS: 1 days A FACE TO FACE EVALUATION WAS PERFORMED  Roger Solis 08/10/2021, 12:16 PM

## 2021-08-10 NOTE — Discharge Instructions (Addendum)
Inpatient Rehab Discharge Instructions  [facility] Discharge date and time: No discharge date for patient encounter.   Activities/Precautions/ Functional Status: Activity: activity as tolerated Diet: regular diet Wound Care: Routine skin checks Functional status:  ___ No restrictions     ___ Walk up steps independently ___ 24/7 supervision/assistance   ___ Walk up steps with assistance ___ Intermittent supervision/assistance  ___ Bathe/dress independently ___ Walk with walker     __x_ Bathe/dress with assistance ___ Walk Independently    ___ Shower independently ___ Walk with assistance    ___ Shower with assistance ___ No alcohol     ___ Return to work/school ________  COMMUNITY REFERRALS UPON DISCHARGE:    Home Health:   PT     OT      RN                   Agency:[facility] Phone: [phone]    Medical Equipment/Items Ordered: Wheelchair                                                 [facility]Supplier: [facility]   Special Instructions:  No driving smoking or alcohol  My questions have been answered and I understand these instructions. I will adhere to these goals and the provided educational materials after my discharge from the hospital.  Patient/Caregiver Signature _______________________________ Date __________  Clinician Signature _______________________________________ Date __________  Please bring this form and your medication list with you to all your follow-up doctor's appointments.

## 2021-08-10 NOTE — Evaluation (Signed)
Occupational Therapy Assessment and Plan  Patient Details  Name: Roger Solis MRN: [id number] Date of Birth: 1955-12-21  OT Diagnosis: abnormal posture, acute pain, altered mental status, cognitive deficits, disturbance of vision, and muscle weakness (generalized) Rehab Potential: Rehab Potential (ACUTE ONLY): Good ELOS: 7-10 days   Today's Date: 08/10/2021 OT Individual Time: 3762-8315 OT Individual Time Calculation (min): 60 min     Hospital Problem: Principal Problem:   Left above-knee amputee [facility])   Past Medical History:  Past Medical History:  Diagnosis Date   CAD (coronary artery disease)    History of brain cancer    HLD (hyperlipidemia)    Hypertension    PAD (peripheral artery disease) ([place])    Past Surgical History:  Past Surgical History:  Procedure Laterality Date   AMPUTATION Left 08/06/2021   Procedure: LEFTAMPUTATION ABOVE KNEE;  Surgeon: Angelia Mould, MD;  Location: [place];  Service: Vascular;  Laterality: Left;   APPLICATION OF CRANIAL NAVIGATION N/A 05/23/2021   Procedure: APPLICATION OF CRANIAL NAVIGATION;  Surgeon: Kristeen Miss, MD;  Location: [place];  Service: Neurosurgery;  Laterality: N/A;   double bypass  2012   FRAMELESS  BIOPSY WITH BRAINLAB N/A 05/23/2021   Procedure: Stereotactic needle biopsy of the brain w/brainlab;  Surgeon: Kristeen Miss, MD;  Location: [place];  Service: Neurosurgery;  Laterality: N/A;    Assessment & Plan Clinical Impression: Patient is a 66 y.o. 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 Ellene Route and followed by Dr. 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 He was  noted to have left sided weakness that patient reports had improved with dexamethasone. Per chart review patient lives with his daughter.  1 level home one-step to entry.  Daughter does assist with some ADLs.  Patient was using a walker.  Presented 08/05/2021 after recent angiogram showed severe multilevel arterial occlusive disease of the left lower extremity as well as gangrenous changes.  Limb was not felt to be salvageable.  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 Deitra Mayo .  Patient transferred to CIR on 08/09/2021 .    Patient currently requires max with basic self-care skills secondary to muscle weakness and muscle joint tightness, decreased cardiorespiratoy endurance, decreased coordination, and decreased attention, decreased memory, and delayed processing.  Prior to hospitalization, patient could complete basic ADL with min.  Patient will benefit from skilled intervention to decrease level of assist with basic self-care skills and increase independence with basic self-care skills prior to discharge home with care partner.  Anticipate patient will require intermittent supervision and no further OT follow recommended.  OT - End of Session Endurance Deficit: Yes Endurance Deficit Description: required rest breaks throughout OT Assessment Rehab Potential (ACUTE ONLY): Good OT Barriers to Discharge: Pending chemo/radiation OT Barriers to Discharge Comments: needs further tx for CA OT Patient demonstrates impairments in the following area(s): Balance;Pain;Cognition;Safety;Sensory;Endurance;Skin Integrity;Motor OT Basic ADL's Functional Problem(s): Grooming;Bathing;Dressing;Toileting OT Transfers Functional Problem(s): Toilet;Tub/Shower OT Plan OT Intensity: Minimum of 1-2 x/day, 45 to 90 minutes OT Frequency: 5 out of 7 days OT Duration/Estimated Length of Stay: 7-10 days OT Treatment/Interventions: Balance/vestibular training;Discharge planning;Pain management;Self Care/advanced ADL  retraining;Therapeutic Activities;UE/LE Coordination activities;Cognitive remediation/compensation;Disease mangement/prevention;Functional mobility training;Patient/family education;Skin care/wound managment;Therapeutic Exercise;DME/adaptive equipment instruction;Community reintegration;UE/LE  Strength taining/ROM;Wheelchair propulsion/positioning OT Self Feeding Anticipated Outcome(s): independent OT Basic Self-Care Anticipated Outcome(s): Mod Independent OT Toileting Anticipated Outcome(s): Mod Independent OT Bathroom Transfers Anticipated Outcome(s): Mod Independent OT Recommendation Patient destination: Home Follow Up Recommendations: None Equipment Recommended: Tub/shower bench;3 in 1 bedside comode   OT Evaluation Precautions/Restrictions  Precautions Precautions: Fall Restrictions Weight Bearing Restrictions: Yes LLE Weight Bearing: Non weight bearing General Chart Reviewed: Yes Family/Caregiver Present: Yes (Daughter Roger Solis) Vital Signs Therapy Vitals Temp: 98.7 F (37.1 C) Pulse Rate: 91 Resp: 17 BP: 133/66 Patient Position (if appropriate): Lying Oxygen Therapy SpO2: 95 % O2 Device: Room Air Pain Pain Assessment Pain Score: 5  Pain Type: Surgical pain Pain Location: Leg Pain Orientation: Left Pain Descriptors / Indicators: Sharp Pain Onset: On-going Patients Stated Pain Goal: 0 Pain Intervention(s): Repositioned;Rest Multiple Pain Sites: No Home Living/Prior Functioning Home Living Family/patient expects to be discharged to:: Private residence Living Arrangements: Children Available Help at Discharge: Family, Available 24 hours/day Type of Home: House Home Access: Stairs to enter [employer] of Steps: 2 STE Entrance Stairs-Rails: None Home Layout: One level Bathroom Shower/Tub: Government social research officer Accessibility: Yes Additional Comments: pt has not been ambulatory for past month  Lives With: Daughter IADL  History Homemaking Responsibilities: No Prior Function Level of Independence: Needs assistance with ADLs Dressing: Minimal  Able to Take Stairs?: No Driving: No Vocation: Retired Microbiologist, Event organiser, and truck Ecologist Baseline Vision/History: 0 No visual deficits Ability to See in Adequate Light: 0 Adequate Patient Visual Report: Other (comment) (reports visual hallucinations) Vision Assessment?: Vision impaired- to be further tested in functional context Additional Comments: hallucinations - visual intermittent Perception  Perception: Within Functional Limits Praxis Praxis: Intact Cognition Cognition Overall Cognitive Status: Impaired/Different from baseline Arousal/Alertness: Awake/alert Memory: Impaired Memory Impairment: Retrieval deficit Attention: Sustained Sustained Attention: Appears intact Awareness: Impaired Awareness Impairment: Anticipatory impairment Problem Solving: Impaired Problem Solving Impairment: Functional basic Executive Function: Self Monitoring;Initiating;Decision Dispensing optician: Impaired Organizing Impairment: Functional basic Decision Making: Impaired Decision Making Impairment: Functional basic Initiating: Impaired Initiating Impairment: Functional basic Self Monitoring: Impaired Self Monitoring Impairment: Verbal basic Behaviors: Other (comment) (patient hard of hearing.  Daughter advocates for patient) Safety/Judgment: Impaired Comments: easiy redirected Brief Interview for Mental Status (BIMS) Repetition of Three Words (First Attempt): 3 Temporal Orientation: Year: Correct Temporal Orientation: Month: Accurate within 5 days Temporal Orientation: Day: Correct Recall: "Sock": No, could not recall Recall: "Blue": Yes, no cue required Recall: "Bed": Yes, no cue required BIMS Summary Score: 13 Sensation Sensation Light Touch: Impaired Detail Peripheral sensation comments: neuropathy  BUE Light Touch Impaired Details: Impaired RUE;Impaired LUE Hot/Cold: Not tested Proprioception: Appears Intact (BUE) Stereognosis: Not tested Additional Comments: hx of neuropathy. Pt reports numbness/tingling in RLE and phantom pain along L residual limb Coordination Gross Motor Movements are Fluid and Coordinated: No Fine Motor Movements are Fluid and Coordinated: Yes Coordination and Movement Description: grossly uncoordinated due to pain, altered balance strategies 2/2 L AKA, and generalzied weakness/deconditioning Finger Nose Finger Test: [facility] but slow 9 Hole Peg Test: NA Motor  Motor Motor: Abnormal postural alignment and control Motor - Skilled Clinical Observations: grossly uncoordinated due to pain, altered balance strategies 2/2 L AKA, and generalized weakness/deconditioning  Trunk/Postural Assessment  Cervical Assessment Cervical Assessment: Within Functional Limits Thoracic Assessment Thoracic Assessment: Exceptions to [facility] Lumbar Assessment Lumbar Assessment: Exceptions to [facility] Postural Control Postural Control: Deficits on evaluation Protective Responses: delayed due to L AKA  Balance Balance Balance Assessed: Yes Static Sitting  Balance Static Sitting - Balance Support: No upper extremity supported;Feet unsupported Static Sitting - Level of Assistance: 4: Min assist Dynamic Sitting Balance Dynamic Sitting - Balance Support: Feet supported;No upper extremity supported;During functional activity Dynamic Sitting - Level of Assistance: 4: Min assist Dynamic Sitting Balance - Compensations: leans backward Dynamic Sitting - Balance Activities: Lateral lean/weight shifting;Forward lean/weight shifting Sitting balance - Comments: able to reach to foot when seated in wheelchair - used one hand as counterbalance Static Standing Balance Static Standing - Balance Support: Bilateral upper extremity supported;During functional activity Static Standing - Level of Assistance: 4:  Min assist Dynamic Standing Balance Dynamic Standing - Balance Support: Bilateral upper extremity supported;During functional activity Dynamic Standing - Level of Assistance: 4: Min assist Extremity/Trunk Assessment RUE Assessment RUE Assessment: Within Functional Limits General Strength Comments: 4+/5 tested in bed - reclined position LUE Assessment LUE Assessment: Within Functional Limits General Strength Comments: 4/5 reclined supine  Care Tool Care Tool Self Care Eating   Eating Assist Level: Supervision/Verbal cueing    Oral Care    Oral Care Assist Level: Supervision/Verbal cueing    Bathing   Body parts bathed by patient: Face Body parts bathed by helper: Right arm;Left arm;Chest;Abdomen;Front perineal area;Buttocks;Right upper leg;Left upper leg;Right lower leg;Left lower leg   Assist Level: Maximal Assistance - Patient 24 - 49%    Upper Body Dressing(including orthotics)       Assist Level: Minimal Assistance - Patient > 75%    Lower Body Dressing (excluding footwear)   What is the patient wearing?: Pants;Incontinence brief Assist for lower body dressing: Maximal Assistance - Patient 25 - 49%    Putting on/Taking off footwear   What is the patient wearing?: Ted hose Assist for footwear: Total Assistance - Patient < 25%       Care Tool Toileting Toileting activity Toileting Activity did not occur (Clothing management and hygiene only): N/A (no void or bm) (patient reports using "pee jug" prior to this session)       Care Tool Bed Mobility Roll left and right activity   Roll left and right assist level: Minimal Assistance - Patient > 75%    Sit to lying activity        Lying to sitting on side of bed activity   Lying to sitting on side of bed assist level: the ability to move from lying on the back to sitting on the side of the bed with no back support.: Minimal Assistance - Patient > 75%     Care Tool Transfers Sit to stand transfer   Sit to stand  assist level: Minimal Assistance - Patient > 75%    Chair/bed transfer   Chair/bed transfer assist level: Minimal Assistance - Patient > 75%     Toilet transfer Toilet transfer activity did not occur:  (declined)       Care Tool Cognition  Expression of Ideas and Wants Expression of Ideas and Wants: 3. Some difficulty - exhibits some difficulty with expressing needs and ideas (e.g, some words or finishing thoughts) or speech is not clear (uses humor - reports hallucinations - becomes dazed)  Understanding Verbal and Non-Verbal Content Understanding Verbal and Non-Verbal Content: 3. Usually understands - understands most conversations, but misses some part/intent of message. Requires cues at times to understand   Memory/Recall Ability Memory/Recall Ability : Current season;That he or she is in a hospital/hospital unit   Refer to Care Plan for [place] 1 OT Short  Term Goal 1 (Week 1): Patient will transfer to commode with min assist OT Short Term Goal 2 (Week 1): Patient will transfer to tub transfer bench with min asssist following instruction OT Short Term Goal 3 (Week 1): Patient will dress lower body with min assist OT Short Term Goal 4 (Week 1): Patient will complete toilet hygiene and clothing management with min assist  Recommendations for other services: None    Skilled Therapeutic Intervention Patient received supine in bed - daughter had assisted him to wash and dress this morning.  Patient agreeable to OT session.  Patient reporting 5/10 pain without activity.  Patient willing to get out of bed, and to wheelchair.  Patient prefers standard walker to RW, and patient desires to have shoes on for transfer versus slipper sock.  Ted hose placed on RLE.  Daughter present and very encouraging.  Patient reported feeling nauseated once up - allowed time for each transition.  Patient and daughter report that he will need to continue CA treatment after rehab.   Patient appears to have excellent family support, and family appreciative of information regarding potential equipment post discharge.  Patient left with daughter up in wheelchair.  Daughter instructed to alert nursing if she left the patient so safety belt could be placed.    ADL ADL Eating: Set up Grooming: Setup;Minimal cueing Where Assessed-Grooming: Bed level Upper Body Bathing: Maximal assistance Where Assessed-Upper Body Bathing: Bed level Lower Body Bathing: Maximal assistance Where Assessed-Lower Body Bathing: Bed level Upper Body Dressing: Minimal assistance Where Assessed-Upper Body Dressing: Edge of bed Lower Body Dressing: Maximal assistance Where Assessed-Lower Body Dressing: Edge of bed Toilet Transfer: Moderate assistance Toilet Transfer Method: Stand pivot Science writer: Radiographer, therapeutic: Not assessed Tub/Shower Transfer Method:  (to be determined) Tub/Shower Equipment:  (anticiapte Tub Producer, television/film/video) Mobility  Bed Mobility Bed Mobility: Rolling Right;Supine to Sit Rolling Right: Minimal Assistance - Patient > 75% Sit to Supine: Minimal Assistance - Patient > 75% Transfers Sit to Stand: Minimal Assistance - Patient > 75%  Discharge Criteria: Patient will be discharged from OT if patient refuses treatment 3 consecutive times without medical reason, if treatment goals not met, if there is a change in medical status, if patient makes no progress towards goals or if patient is discharged from hospital.  The above assessment, treatment plan, treatment alternatives and goals were discussed and mutually agreed upon: by patient and by family  Mariah Milling 08/10/2021, 4:14 PM

## 2021-08-10 NOTE — Progress Notes (Signed)
Physical Therapy Session Note  Patient Details  Name: Roger Solis MRN: [id number] Date of Birth: [birth year]  Today's Date: 08/10/2021 PT Individual Time: 3888-2800 PT Individual Time Calculation (min): 55 min   Short Term Goals: Week 1:  PT Short Term Goal 1 (Week 1): STG=LTG due to LOS  Skilled Therapeutic Interventions/Progress Updates:    Received pt semi-reclined in bed, [name], and reported pain 5/10 in L residual limb (premedicated) - RN present to administer additional medications. Session with emphasis on functional mobility/transfers, generalized strengthening and endurance, and dynamic standing balance. Rewrapped L residual limb and pt transferred semi-reclined<>sitting EOB with HOB elevated and use of bedrails with supervision. Donned R shoe with mod A and transferred bed<>WC squat<>pivot with min A. Pt performed WC mobility 178ft x 2 trials using BUE and supervision to/from dayroom. Sit<>stand with RW and CGA/min A and worked on Associate Professor horseshoes with RUE x 4 trials with CGA for balance. Pt with 1 instance of R lateral LOB requiring mod A to correct. Pt then performed the following exercises with emphasis on UE/LE strength/ROM: -WC pushups 2x10 -LLE LAQ with 2.5lb ankle weight 2x12 -LLE hip flexion with 2.5lb ankle weight 2x12 -bicep curls with 5.5lb dowel 2x20 -overhead chest press with 5.5lb dowel 2x12 -horizontal chest press with 5.5lb dowel 2x12 Provided pt with new 16x16 manual WC as current one steered to the L and armrest was unstable. Pt transferred WC<>WC stand<>pivot with RW and min A. Returned to room and requested to return to bed. Squat<>pivot WC<>bed with min A and cues for technique. Doffed R shoe with max A and transferred sit<>supine with supervision. Concluded session with pt semi-reclined in bed, needs within reach, and bed alarm on.   Therapy Documentation Precautions:     Therapy/Group: Individual  Therapy Alfonse Alpers PT, DPT  08/10/2021, 7:11 AM

## 2021-08-10 NOTE — Progress Notes (Signed)
Inpatient Rehabilitation Care Coordinator Assessment and Plan Patient Details  Name: Roger Solis MRN: [id number] Date of Birth: 1955-12-22  Today's Date: 08/10/2021  Hospital Problems: Principal Problem:   Left above-knee amputee [facility])  Past Medical History:  Past Medical History:  Diagnosis Date   CAD (coronary artery disease)    History of brain cancer    HLD (hyperlipidemia)    Hypertension    PAD (peripheral artery disease) ([place])    Past Surgical History:  Past Surgical History:  Procedure Laterality Date   AMPUTATION Left 08/06/2021   Procedure: LEFTAMPUTATION ABOVE KNEE;  Surgeon: Angelia Mould, MD;  Location: [place];  Service: Vascular;  Laterality: Left;   APPLICATION OF CRANIAL NAVIGATION N/A 05/23/2021   Procedure: APPLICATION OF CRANIAL NAVIGATION;  Surgeon: Kristeen Miss, MD;  Location: [place];  Service: Neurosurgery;  Laterality: N/A;   double bypass  2012   FRAMELESS  BIOPSY WITH BRAINLAB N/A 05/23/2021   Procedure: Stereotactic needle biopsy of the brain w/brainlab;  Surgeon: Kristeen Miss, MD;  Location: [place];  Service: Neurosurgery;  Laterality: N/A;   Social History:  reports that he has been smoking cigarettes. He has been smoking an average of 1 pack per day. He has never used smokeless tobacco. He reports current alcohol use. He reports that he does not use drugs.  Family / Support Systems Anticipated Caregiver: Daughter Ability/Limitations of Caregiver: none Caregiver Availability: 24/7 Family Dynamics: support from daughter  Social History Preferred language: English Religion:  Health Literacy - How often do you need to have someone help you when you read instructions, pamphlets, or other written material from your doctor or pharmacy?: Never Employment Status: Retired Public relations account executive Issues: n/a Guardian/Conservator: Nichola Sizer   Abuse/Neglect Abuse/Neglect Assessment Can Be Completed: Yes Physical Abuse: Denies Verbal  Abuse: Denies Sexual Abuse: Denies Exploitation of patient/patient's resources: Denies Self-Neglect: Denies  Patient response to: Social Isolation - How often do you feel lonely or isolated from those around you?: Never  Emotional Status Recent Psychosocial Issues: coping Psychiatric History: n/a Substance Abuse History: n/a  Patient / Family Perceptions, Expectations & Goals Pt/Family understanding of illness & functional limitations: yes Premorbid pt/family roles/activities: Previously MOD I, some assistance with ADLS Anticipated changes in roles/activities/participation: Patient lives with daughter and anticipated to return Pt/family expectations/goals: MOD I to Intermittent Supervision  Community Resources Premorbid Home Care/DME Agencies: Other (Comment) Librarian, academic, Radiation protection practitioner, Civil engineer, contracting) Transportation available at discharge: Daughter able to transport Is the patient able to respond to transportation needs?: Yes In the past 12 months, has lack of transportation kept you from medical appointments or from getting medications?: No In the past 12 months, has lack of transportation kept you from meetings, work, or from getting things needed for daily living?: No  Discharge Planning Living Arrangements: Children Support Systems: Children Type of Residence: Private residence Insurance Resources: Multimedia programmer (specify) ([place]) Financial Resources: Family Support Financial Screen Referred: No Living Expenses: Lives with family Money Management: Family Does the patient have any problems obtaining your medications?: No Home Management: Some assistance Patient/Family Preliminary Plans: Able to continue with assistance at home Care Coordinator Anticipated Follow Up Needs: HH/OP Expected length of stay: 10-12 Days  Clinical Impression SW spoke with patient daughter to introduce self, explain role and address questions and concerns. Patient currently lives with  daughter and anticipating to return. No additional questions or concerns. Sw contact information left in the room with patient.   Dyanne Iha 08/10/2021, 12:45 PM

## 2021-08-11 DIAGNOSIS — D72829 Elevated white blood cell count, unspecified: Secondary | ICD-10-CM

## 2021-08-11 DIAGNOSIS — E8809 Other disorders of plasma-protein metabolism, not elsewhere classified: Secondary | ICD-10-CM

## 2021-08-11 NOTE — Progress Notes (Signed)
PROGRESS NOTE   Subjective/Complaints: No new complaints this AM. Reports hydrocodone helping keep pain under control.  ROS: +phantom limb pain. No CP or SOB.   Objective:   No results found. Recent Labs    08/10/21 0533  WBC 10.8*  HGB 11.5*  HCT 34.9*  PLT 231    Recent Labs    08/10/21 0533  NA 133*  K 3.5  CL 98  CO2 24  GLUCOSE 107*  BUN 11  CREATININE 0.64  CALCIUM 8.5*     Intake/Output Summary (Last 24 hours) at 08/11/2021 2236 Last data filed at 08/11/2021 1811 Gross per 24 hour  Intake 480 ml  Output 700 ml  Net -220 ml         Physical Exam: Vital Signs Blood pressure (!) 165/77, pulse 78, temperature 98.1 F (36.7 C), temperature source Oral, resp. rate 18, height 6' (1.829 m), weight 74 kg, SpO2 95 %. Gen: no distress, normal appearing HEENT: R crani incision well healed, EOMI, sclera anicteric, oral mucosa pink and moist, hard of hearing Cardio: RRR Chest: CTAB normal effort, normal rate of breathing Extremities: R AKA, covered in clean and dry dressing Psych: Pt's affect is appropriate. Pt is cooperative Skin: Clean and intact without signs of breakdown Neuro:  Alert and oriented x3, CN 2-12 intact other than mild facial weakness, follows commands, speech and language appears to be intact, mild L facial weakness noted(chronic), BL UE strength 5/5, RLE 4+/5 Hip flex, knee extension 5/5, Ankle PF and DF 4+/5, not able to lift L residual limb, sensation decreased in stocking glove distribution RLE, mild intention tremor noted Musculoskeletal:  No joint swelling or tenderness noted, R AKA with clean and dry dressing, tender to palpation around incision   Assessment/Plan: 1. Functional deficits which require 3+ hours per day of interdisciplinary therapy in a comprehensive inpatient rehab setting. Physiatrist is providing close team supervision and 24 hour management of active medical  problems listed below. Physiatrist and rehab team continue to assess barriers to discharge/monitor patient progress toward functional and medical goals  Care Tool:  Bathing    Body parts bathed by patient: Right arm, Left arm, Chest, Abdomen, Right upper leg, Right lower leg, Left upper leg, Face   Body parts bathed by helper: Right arm, Left arm, Chest, Abdomen, Front perineal area, Buttocks, Right upper leg, Left upper leg, Right lower leg, Left lower leg Body parts n/a: Left lower leg (amputee)   Bathing assist Assist Level: Supervision/Verbal cueing     Upper Body Dressing/Undressing Upper body dressing   What is the patient wearing?: Pull over shirt    Upper body assist Assist Level: Set up assist    Lower Body Dressing/Undressing Lower body dressing      What is the patient wearing?: Pants     Lower body assist Assist for lower body dressing: Moderate Assistance - Patient 50 - 74%     Toileting Toileting Toileting Activity did not occur (Clothing management and hygiene only): N/A (no void or bm) (patient reports using "pee jug" prior to this session)  Toileting assist Assist for toileting: Moderate Assistance - Patient 50 - 74%     Transfers  Chair/bed transfer  Transfers assist  Chair/bed transfer activity did not occur: Refused  Chair/bed transfer assist level: Minimal Assistance - Patient > 75% (stand<>pivot)     Locomotion Ambulation   Ambulation assist   Ambulation activity did not occur: Refused  Assist level: Set up assist       Walk 10 feet activity   Assist  Walk 10 feet activity did not occur: Refused  Assist level: Set up assist     Walk 50 feet activity   Assist Walk 50 feet with 2 turns activity did not occur: Refused  Assist level: Set up assist      Walk 150 feet activity   Assist Walk 150 feet activity did not occur: Refused         Walk 10 feet on uneven surface  activity   Assist Walk 10 feet on uneven  surfaces activity did not occur: Refused         Wheelchair     Assist Is the patient using a wheelchair?: Yes Type of Wheelchair: Manual    Wheelchair assist level: Supervision/Verbal cueing Max wheelchair distance: 182ft    Wheelchair 50 feet with 2 turns activity    Assist        Assist Level: Supervision/Verbal cueing   Wheelchair 150 feet activity     Assist      Assist Level: Supervision/Verbal cueing   Blood pressure (!) 165/77, pulse 78, temperature 98.1 F (36.7 C), temperature source Oral, resp. rate 18, height 6' (1.829 m), weight 74 kg, SpO2 95 %.  Medical Problem List and Plan: 1. 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  Antithrombotics: -DVT/anticoagulation:  Pharmaceutical: Heparin             -antiplatelet therapy: N/A 3. Phantom limb pain: Increase Neurontin to 200 mg 3 times daily, hydrocodone as needed 4. Mood: Emotional support             -antipsychotic agents: N/A 5. Neuropsych: This patient is capable of making decisions on his own behalf. 6. Skin/Wound Care: Routine skin check 7. Fluids/Electrolytes/Nutrition: Routine in and outs with follow-up chemistries 8.  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  Status post stereotactic brain biopsy 05/23/2021.  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 Follow-up with Dr. Cecil Cobbs.  Decadron therapy as indicated, this is being tapered 9.  Seizure prophylaxis.  Keppra 500 mg twice daily. Denies recent seizure 10.  Hyperlipidemia.  Lipitor 11.  Tobacco abuse.  NicoDerm patch.  Counseling 12.  CAD/CABG.  No chest pain or shortness of breath 13. Hx of hypertension. He has renal  artery narrowing. No home medications. Continue to monitor trend 14. History of CVA 15. Constipation             -on senokot, will add miralax, prn dulcolax supp 16. Leukocytosis  8/9 improved down to 10.8 yesterday 17. Severe vitamin D deficiency level 14: start ergocalciferol 50,000U once per week for 7 weeks.  18. Acute blood loss anemia, mild  -HGB stable at 11.5 19. Hypoalbuminemia albumin 2.2  -  Encourage good nutrition, he appears to be most his meals      LOS: 2 days A FACE TO FACE EVALUATION WAS PERFORMED  Jennye Boroughs 08/11/2021, 10:36 PM

## 2021-08-11 NOTE — Progress Notes (Signed)
Occupational Therapy Session Note  Patient Details  Name: Roger Solis MRN: [id number] Date of Birth: [birth year]  Today's Date: 08/11/2021 OT Individual Time: 0800-0900 OT Individual Time Calculation (min): 60 min    Short Term Goals: Week 1:  OT Short Term Goal 1 (Week 1): Patient will transfer to commode with min assist OT Short Term Goal 2 (Week 1): Patient will transfer to tub transfer bench with min asssist following instruction OT Short Term Goal 3 (Week 1): Patient will dress lower body with min assist OT Short Term Goal 4 (Week 1): Patient will complete toilet hygiene and clothing management with min assist  Skilled Therapeutic Interventions/Progress Updates:    S: Patient sitting in bed with right leg draped over edge of bed. Shrinker and ace wrap off of left residual limb.   O: - Provided verbal education while therapist applied ace wrap and compression shrinker provided by [facility]. Reviewed purpose of the ace wrap and compression shrinker while verbalizing steps to donn. - Functional transfer: Performed with standard walker at min assist from bed to w/c. Provided safe set-up of equipment with VC for form and technique.  - BUE strengthening: Pt performed seated tricep w/c push ups; holding each for 3 seconds prior to descending. 12X - Manual therapy: Myofascial release and manual  prolonged stretching  completed to left hip, quad, and abductor to decrease fascial restrictions and increase joint mobility in a pain free zone in order to demonstrate appropriate hip extension required for future prosthetic wearing. - Isometrics: supine, left hip flexion, abduction, 3x10" - Standing balance and tolerance task performed with use of standard walker. Patient focused on maintaining balance with the assist of his RUE on walker while playing corn hole.    A: Muscle fatigue noted with prolonged standing activity. Patient determined to be as independent as he can. Initially, was  resistant to education and instructions provided by therapist although did become more receptive as session progressed. Patient did ask great questions regarding his recovery and will need continued education throughout his therapy sessions. Manual therapy was performed to address moderate fascial restrictions in the left residual limb. Pt initially demonstrated the inability to fully extend his left hip to 0 degrees. At end of session, he was able to achieve almost 0 degrees of hip extension.    P:  HEP and education on shrinker, exercises, stretches, etc. Manual therapy as needed to left residual limb, standing tolerance.  Therapy Documentation Precautions:  Precautions Precautions: Fall Restrictions Weight Bearing Restrictions: Yes LLE Weight Bearing: Non weight bearing  Pain: 8/10 pain verbalized in left residual limb. Provided ice pack at end of session for pain management.   Therapy/Group: Individual Therapy  Ailene Ravel, OTR/L,CBIS  Supplemental OT - [facility]  08/11/2021, 7:51 AM

## 2021-08-11 NOTE — Progress Notes (Addendum)
Physical Therapy Session Note  Patient Details  Name: Roger Solis MRN: [id number] Date of Birth: 1955/12/27  Today's Date: 08/11/2021 PT Individual Time: 1000-1055 PT Individual Time Calculation (min): 55 min   Short Term Goals: Week 1:  PT Short Term Goal 1 (Week 1): STG=LTG due to LOS  Skilled Therapeutic Interventions/Progress Updates:   Received pt sitting in recliner with shrinker donned to L residual limb. [name] and reported pain 7.5/10 in L residual limb - RN notified and present to administer medication. Session with emphasis on functional mobility/transfers, generalized strengthening and endurance, dynamic standing balance/coordination. Pt transferred recliner<>WC stand<>pivot with standard walker and min A and took measurements to fit pt for 16x16 manual WC. Pt performed WC mobility 197ft using BUE and supervision to dayroom and transferred on/off Nustep with standard walker and min A. Pt performed BUE/RLE strengthening on Nustep at workload 3 for 8 minutes for a total of 263 steps with emphasis on cardiovascular endurance. Secure chatted MD to ask for grounds pass per pt request. Provided pt with HEP and educated on frequency/duration/technique for the following exercises: - Wheelchair Push-Up (AKA)  - 1 x daily - 7 x weekly - 3 sets - 10 reps - Supine Single Leg Bridge with Sound Leg (AKA)  - 1 x daily - 7 x weekly - 3 sets - 10 reps - Sidelying Hip Abduction with Weight (AKA)  - 1 x daily - 7 x weekly - 3 sets - 10 reps - Prone Hip Extension with Residual Limb (AKA)  - 1 x daily - 7 x weekly - 3 sets - 10 reps Pt transferred WC<>mat stand<>pivot with standard walker and CGA and sit<>sidelying<>prone with supervision. Pt performed the following exercises with supervision and verbal cues for technique: -prone L hip flexor stretch - educated pt on importance of hip flexor stretching to prevent contracture and prepare for prosthetic fitting. Also educated pt on  prosthetic fitting timeline of 6-8 weeks and importance of desensitization (tapping/rubbing/massage). -prone L hip extension 3x10 -prone on elbows<>prone on elbows in extension 2x10 with 3-5 second isometric hold -supine single leg bridge x8 with cues for breathing techniques Pt limited by phantom pain and "numbness in buttocks". Pt transferred supine<>sidelying<>sitting EOM with supervision and squat<>pivot mat<>WC with CGA. Pt transported back to room in [facility] dependently and requested to return to bed. WC<>bed squat<>pivot with CGA and cues for hand placement/technique. Doffed shoe with max A and concluded session with pt sitting EOB, needs within reach, and bed alarm on.   Therapy Documentation Precautions:  Precautions Precautions: Fall Restrictions Weight Bearing Restrictions: Yes LLE Weight Bearing: Non weight bearing  Therapy/Group: Individual Therapy Alfonse Alpers PT, DPT  08/11/2021, 6:56 AM

## 2021-08-11 NOTE — Progress Notes (Signed)
Occupational Therapy Session Note  Patient Details  Name: Roger Solis MRN: [id number] Date of Birth: [birth year]  Today's Date: 08/11/2021 OT Individual Time: 1300-1358 OT Individual Time Calculation (min): 58 min    Short Term Goals: Week 1:  OT Short Term Goal 1 (Week 1): Patient will transfer to commode with min assist OT Short Term Goal 2 (Week 1): Patient will transfer to tub transfer bench with min asssist following instruction OT Short Term Goal 3 (Week 1): Patient will dress lower body with min assist OT Short Term Goal 4 (Week 1): Patient will complete toilet hygiene and clothing management with min assist  Skilled Therapeutic Interventions/Progress Updates:  Skilled OT intervention completed with focus on functional endurance, ADL retraining, transfers. Pt received upright in bed, agreeable to session, minimal un-rated pain reported initially in residual, however increased pain with positional movements therefore therapist offered rest breaks and repositioning for pain reduction. Completed bed mobility with supervision, then squat pivot > L to w/c with min A. Dependent transfer to shower chair, then semi-stand/squat pivot to shower chair > L using bilateral hands on grab bars with heavy min A. Pt able to stand using grab bars with min A for balance for shower tasks including doffing shorts. Pt stating the shrinker is too painful to remove therefore left donned and covered residual limb with water proof cover. Completed all bathing excluding pericare per preference with supervision. Donned shirt with set up, and LB with mod A at the sit > stand level with grab bars. Pt with noticeable 2 skin tears on R knuckle, with pt reporting he gets these all time and when he bumps them they bleed. Therapist applied medipore bandage to knuckle area, with nurse notified of skin status. Completed semi-stand pivot > R using grab bars with mod A, with pt stating "I'm gonna fall!" Therapist providing  education on more efficient method to limit falls, as well as OT purpose with practicing transfers however therapist always being present for safety and assist. Seated at sink, pt completed grooming with set up A. Squat pivot > R from w/c > EOB with min A, then supervision bed mobility. Therapist attempted to locate amputee support/education packet however unable to locate. Would benefit from one when able to locate. Pt was left upright in bed, with nurse in room, bed alarm on and all needs in reach at end of session.    Therapy Documentation Precautions:  Precautions Precautions: Fall Restrictions Weight Bearing Restrictions: Yes LLE Weight Bearing: Non weight bearing    Therapy/Group: Individual Therapy  Blase Mess, MS, OTR/L  08/11/2021, 7:42 AM

## 2021-08-12 DIAGNOSIS — K5901 Slow transit constipation: Secondary | ICD-10-CM

## 2021-08-12 DIAGNOSIS — D62 Acute posthemorrhagic anemia: Secondary | ICD-10-CM

## 2021-08-12 DIAGNOSIS — G546 Phantom limb syndrome with pain: Secondary | ICD-10-CM

## 2021-08-12 DIAGNOSIS — I739 Peripheral vascular disease, unspecified: Secondary | ICD-10-CM

## 2021-08-12 MED ORDER — GABAPENTIN 300 MG PO CAPS
300.0000 mg | ORAL_CAPSULE | Freq: Three times a day (TID) | ORAL | Status: DC
  Administered 2021-08-12 – 2021-08-20 (×24): 300 mg via ORAL
  Filled 2021-08-12 (×24): qty 1

## 2021-08-12 MED ORDER — SORBITOL 70 % SOLN
60.0000 mL | Status: AC
  Administered 2021-08-12: 60 mL via ORAL
  Filled 2021-08-12: qty 60

## 2021-08-12 NOTE — Progress Notes (Signed)
This nurse went to do daily dressing change to pts stump, upon assessment there was no dressing on stump only shrinker. Pt stated he was told he no longer needed a daily dressing change. Spoke with Dr. Naaman Plummer regarding dressing change orders. New order to d/c dressing change placed. Pt stump is clean dry no redness bleeding or swelling around incision or staples.   Dayna Ramus

## 2021-08-12 NOTE — Progress Notes (Signed)
Physical Therapy Session Note  Patient Details  Name: Roger Solis MRN: [id number] Date of Birth: [birth year]  Today's Date: 08/12/2021 PT Individual Time: 0800-0900; [id number] PT Individual Time Calculation (min): 60 min , 45 min  Short Term Goals: Week 1:  PT Short Term Goal 1 (Week 1): STG=LTG due to LOS  Skilled Therapeutic Interventions/Progress Updates:  Tx 1:  Pt resting in bed.  He reported pain anterior residual limb, 7/10.  Pt reported concern about circulation in RLE; blood pools in heel when he is in bed.  PT notified Nsg of this, and for admininstration of meds during session.  Pt's shrinker sock was loose, and ACE wrap had come off of residual limb.  PT educated pt about donning shrinker sock, and doffed ACE.  Pt donned sock with min assist, rolling L/R with supervision as needed.  Supine> sit with extra time and use of L raling.  Pt had LOb when sitting up; he recovered without assistance.  Pt donned R sock and shoe in sitting.  Stand pivot transfer using SW, CGA to wc to L.  Wc propulsion using bil UEs, supervison and cues for efficiency, x 150' x 2. Pt placed and removed R footrest with extra time and mod cues, after demo.  Wc propulsion over carpet, and backwards through doorway in congested area, and opening door using R hand, all with supervision.  Strengthening for RLE using Kinetron in sitting in wc, at level 30 cm/sec, x 20 cycles x 2 targeting quadriceps, x 20 cycles x 1 targeting gluteals. Abdominal crunches, modified, performed in wc, x 15 x 2.  At end of session, pt resting in wc with needs at hand.  Discussed safety; pt agreed to call for staff if he needed to transfer out of wc.  Tx 2:  Pt seated in wc.  He reported pain 7/10 in L lateral hip; ice pack on hip.  He reported that this hip joint "acts up" every so often. He is tender over L trochanter.   With significant discussion about home entry, he reported that he has used a wc outside the home for months.   His dtr manages his wc on the steps ; pt finds this scary and has "trust issues" about the process.  Also, he reported ambulation using a RW once when the RW scooted out from him.  He fell face -first in is driveway; that is why he is reluctant to attempt ambulation.  PT provided emotional support.   Using hand out, pt performed HEP : 10 x 2 each: seated in wc boost ups pressing with UEs to lift hips; in supine, unilateal RLE bridging; in R side lying, L hip abduction (pt assisted pt in holding hip in neutral); in prone , 10 x 1 each- forearm push ups, L hip extension.  Also static prone x 2 minutes.  Bed mobility with extra time and supervision.   At end of session, pt seated in wc with needs at hand.  Dtr and SIL present.  They confirmed that pt uses wc in community.     Therapy Documentation Precautions:  Precautions Precautions: Fall Restrictions Weight Bearing Restrictions: No LLE Weight Bearing: Non weight bearing      Therapy/Group: Individual Therapy  Mj Willis 08/12/2021, 9:03 AM

## 2021-08-12 NOTE — Progress Notes (Signed)
PROGRESS NOTE   Subjective/Complaints: Generally doing well. Still has stump and phantom limb paine  ROS: Patient denies fever, rash, sore throat, blurred vision, dizziness, nausea, vomiting, diarrhea, cough, shortness of breath or chest pain,   back/neck pain, headache, or mood change.    Objective:   No results found. Recent Labs    08/10/21 0533  WBC 10.8*  HGB 11.5*  HCT 34.9*  PLT 231   Recent Labs    08/10/21 0533  NA 133*  K 3.5  CL 98  CO2 24  GLUCOSE 107*  BUN 11  CREATININE 0.64  CALCIUM 8.5*    Intake/Output Summary (Last 24 hours) at 08/12/2021 0932 Last data filed at 08/12/2021 0700 Gross per 24 hour  Intake 720 ml  Output 1325 ml  Net -605 ml        Physical Exam: Vital Signs Blood pressure 132/74, pulse 84, temperature 98.3 F (36.8 C), temperature source Oral, resp. rate 18, height 6' (1.829 m), weight 74 kg, SpO2 95 %. Constitutional: No distress . Vital signs reviewed. HEENT: NCAT, EOMI, oral membranes moist Neck: supple Cardiovascular: RRR without murmur. No JVD    Respiratory/Chest: CTA Bilaterally without wheezes or rales. Normal effort    GI/Abdomen: BS +, non-tender, non-distended Ext: no clubbing, cyanosis, or edema Psych: pleasant and cooperative  Skin: right aka cdi, abrasion left knee.  Neuro:  Alert and oriented x3, CN 2-12 intact other than mild facial weakness, follows commands, speech and language appears to be intact, mild L facial weakness noted(chronic), BL UE strength 5/5, RLE 4+/5 Hip flex, knee extension 5/5, Ankle PF and DF 4+/5, not able to lift L residual limb, sensation decreased in stocking glove distribution RLE, mild intention tremor noted Musculoskeletal:  No joint swelling or tenderness noted, R AKA  remains tender to palpation around incision   Assessment/Plan: 1. Functional deficits which require 3+ hours per day of interdisciplinary therapy in a  comprehensive inpatient rehab setting. Physiatrist is providing close team supervision and 24 hour management of active medical problems listed below. Physiatrist and rehab team continue to assess barriers to discharge/monitor patient progress toward functional and medical goals  Care Tool:  Bathing    Body parts bathed by patient: Right arm, Left arm, Chest, Abdomen, Right upper leg, Right lower leg, Left upper leg, Face   Body parts bathed by helper: Right arm, Left arm, Chest, Abdomen, Front perineal area, Buttocks, Right upper leg, Left upper leg, Right lower leg, Left lower leg Body parts n/a: Left lower leg (amputee)   Bathing assist Assist Level: Supervision/Verbal cueing     Upper Body Dressing/Undressing Upper body dressing   What is the patient wearing?: Pull over shirt    Upper body assist Assist Level: Set up assist    Lower Body Dressing/Undressing Lower body dressing      What is the patient wearing?: Pants     Lower body assist Assist for lower body dressing: Moderate Assistance - Patient 50 - 74%     Toileting Toileting Toileting Activity did not occur (Clothing management and hygiene only): N/A (no void or bm) (patient reports using "pee jug" prior to this session)  Toileting assist  Assist for toileting: Moderate Assistance - Patient 50 - 74%     Transfers Chair/bed transfer  Transfers assist     Chair/bed transfer assist level: Minimal Assistance - Patient > 75% (stand<>pivot)     Locomotion Ambulation   Ambulation assist   Ambulation activity did not occur: Refused  Assist level: Set up assist       Walk 10 feet activity   Assist  Walk 10 feet activity did not occur: Refused  Assist level: Set up assist     Walk 50 feet activity   Assist Walk 50 feet with 2 turns activity did not occur: Refused  Assist level: Set up assist      Walk 150 feet activity   Assist Walk 150 feet activity did not occur: Refused          Walk 10 feet on uneven surface  activity   Assist Walk 10 feet on uneven surfaces activity did not occur: Refused         Wheelchair     Assist Is the patient using a wheelchair?: Yes Type of Wheelchair: Manual    Wheelchair assist level: Supervision/Verbal cueing Max wheelchair distance: 171ft    Wheelchair 50 feet with 2 turns activity    Assist        Assist Level: Supervision/Verbal cueing   Wheelchair 150 feet activity     Assist      Assist Level: Supervision/Verbal cueing   Blood pressure 132/74, pulse 84, temperature 98.3 F (36.8 C), temperature source Oral, resp. rate 18, height 6' (1.829 m), weight 74 kg, SpO2 95 %.  Medical Problem List and Plan: 1. 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  Antithrombotics: -DVT/anticoagulation:  Pharmaceutical: Heparin             -antiplatelet therapy: N/A 3. Phantom limb pain:   hydrocodone as needed  -6/10 adjust gabapentin to 300mg  bid  -discussed massage/tactile feedback to stump, visual therapy to help with pain 4. Mood: Emotional support             -antipsychotic agents: N/A 5. Neuropsych: This patient is capable of making decisions on his own behalf. 6. Skin/Wound Care: Routine skin check 7. Fluids/Electrolytes/Nutrition: Routine in and outs with follow-up chemistries 8.  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  Status post stereotactic brain biopsy 05/23/2021.  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 Follow-up with Dr. Cecil Cobbs.  Decadron therapy as indicated, this is being tapered 9.  Seizure prophylaxis.  Keppra 500 mg twice daily. Denies  recent seizure 10.  Hyperlipidemia.  Lipitor 11.  Tobacco abuse.  NicoDerm patch.  Counseling 12.  CAD/CABG.  No chest pain or shortness of breath 13. Hx of hypertension. He has renal artery narrowing. No home medications. Continue to monitor trend 14. History of CVA 15. Constipation             -on senokot,   miralax, prn dulcolax supp  6/10 no bm in a week, sorbitol + SSE today  16. Leukocytosis  8/9 improved down to 10.8 yesterday 17. Severe vitamin D deficiency level 14: started ergocalciferol 50,000U once per week for 7 weeks.  18. Acute blood loss anemia, mild  -HGB stable at 11.5 19. Hypoalbuminemia albumin 2.2  -Encourage good nutrition, he appears to be most his meals      LOS: 3 days A FACE TO FACE EVALUATION WAS PERFORMED  Meredith Staggers 08/12/2021, 9:32 AM

## 2021-08-12 NOTE — IPOC Note (Signed)
Overall Plan of Care [facility]) Patient Details Name: Roger Solis MRN: [id number] DOB: 1955-08-13  Admitting Diagnosis: Left above-knee amputee [facility])  Hospital Problems: Principal Problem:   Left above-knee amputee [facility])     Functional Problem List: Nursing Bowel, Edema, Endurance, Medication Management, Motor, Pain, Perception, Safety, Sensory, Skin Integrity  PT Balance, Edema, Endurance, Motor, Nutrition, Pain, Perception, Sensory, Skin Integrity  OT Balance, Pain, Cognition, Safety, Sensory, Endurance, Skin Integrity, Motor  SLP    TR         Basic ADL's: OT Grooming, Bathing, Dressing, Toileting     Advanced  ADL's: OT       Transfers: PT Bed Mobility, Bed to Chair, Car, Manufacturing systems engineer, Metallurgist: PT Ambulation, Emergency planning/management officer, Stairs     Additional Impairments: OT    SLP        TR      Anticipated Outcomes Item Anticipated Outcome  Self Feeding independent  Swallowing      Basic self-care  Mod Independent  Toileting  Mod Independent   Bathroom Transfers Mod Independent  Bowel/Bladder  supervision  Transfers  supervision with LRAD  Locomotion  supervision with LRAD  Communication     Cognition     Pain  < 4  Safety/Judgment  supervision   Therapy Plan: PT Intensity: Minimum of 1-2 x/day ,45 to 90 minutes PT Frequency: 5 out of 7 days PT Duration Estimated Length of Stay: 7-10 days OT Intensity: Minimum of 1-2 x/day, 45 to 90 minutes OT Frequency: 5 out of 7 days OT Duration/Estimated Length of Stay: 7-10 days     Team Interventions: Nursing Interventions Patient/Family Education, Bowel Management, Disease Management/Prevention, Pain Management, Medication Management, Skin Care/Wound Management, Discharge Planning, Psychosocial Support  PT interventions Ambulation/gait training, Discharge planning, Functional mobility training, Psychosocial support, Therapeutic Activities, Visual/perceptual  remediation/compensation, Balance/vestibular training, Disease management/prevention, Neuromuscular re-education, Skin care/wound management, Therapeutic Exercise, Wheelchair propulsion/positioning, Cognitive remediation/compensation, DME/adaptive equipment instruction, Pain management, Splinting/orthotics, UE/LE Strength taining/ROM, Community reintegration, Barrister's clerk education, IT trainer, UE/LE Coordination activities  OT Interventions Training and development officer, Discharge planning, Pain management, Self Care/advanced ADL retraining, Therapeutic Activities, UE/LE Coordination activities, Cognitive remediation/compensation, Disease mangement/prevention, Functional mobility training, Patient/family education, Skin care/wound managment, Therapeutic Exercise, DME/adaptive equipment instruction, Community reintegration, UE/LE Strength taining/ROM, Wheelchair propulsion/positioning  SLP Interventions    TR Interventions    SW/CM Interventions Discharge Planning, Psychosocial Support, Patient/Family Education, Disease Management/Prevention   Barriers to Discharge MD  Medical stability  Nursing Decreased caregiver support, Incontinence, Wound Care, Lack of/limited family support, Weight bearing restrictions 1 level, 1 & 1 step, no rails. Lives with daughter who can provide assist at discharge.  PT Inaccessible home environment, Home environment access/layout, Wound Care, Weight bearing restrictions, Other (comments) pain, NWB LLE, 2 STE with no railings  OT Pending chemo/radiation needs further tx for CA  SLP      SW       Team Discharge Planning: Destination: PT-Home ,OT- Home , SLP-  Projected Follow-up: PT-Home health PT, OT-  None, SLP-  Projected Equipment Needs: PT-To be determined, OT- Tub/shower bench, 3 in 1 bedside comode, SLP-  Equipment Details: PT-has standard walker and transport chair, OT-  Patient/family involved in discharge planning: PT- Patient, Family  member/caregiver,  OT-Patient, Family member/caregiver, SLP-   MD ELOS: 7-10 days Medical Rehab Prognosis:  Excellent Assessment: The patient has been admitted for CIR therapies with the diagnosis of left AKA. The team will be addressing functional mobility, strength, stamina, balance, safety,  adaptive techniques and equipment, self-care, bowel and bladder mgt, patient and caregiver education, pre-prosthetic education, wound/stump care, ego support, community reentry. Goals have been set at mod I to supervision. Anticipated discharge destination is home.        See Team Conference Notes for weekly updates to the plan of care

## 2021-08-12 NOTE — Progress Notes (Signed)
Occupational Therapy Session Note  Patient Details  Name: Roger Solis MRN: [id number] Date of Birth: 05/28/1955  Today's Date: 08/12/2021 OT Individual Time: 3159-4585 & [id number] OT Individual Time Calculation (min): 25 min & 58 min   Short Term Goals: Week 1:  OT Short Term Goal 1 (Week 1): Patient will transfer to commode with min assist OT Short Term Goal 2 (Week 1): Patient will transfer to tub transfer bench with min asssist following instruction OT Short Term Goal 3 (Week 1): Patient will dress lower body with min assist OT Short Term Goal 4 (Week 1): Patient will complete toilet hygiene and clothing management with min assist  Skilled Therapeutic Interventions/Progress Updates:  Session 1 Skilled OT intervention completed with focus on BUE exercises to promote strength/endurance needed for functional transfers and ADL tasks. Pt received seated in w/c, 8/10 pain in L hip/thigh, with nurse in room administering meds, with therapist providing ice and seated exercises until pain subsides for pain reduction. Seated in w/c pt completed the following:  (With red theraband) 10 reps Horizontal abduction Self-anchored shoulder flexion each arm Self-anchored bicep flexion each arm Therapist-anchored scapular retraction each arm Shoulder external rotation Shoulder extension  Cues needed for form and technique throughout. Pt was left seated in w/c, with chair pad alarm on and all needs in reach at end of session.  Session 2 Skilled OT intervention completed with focus on dynamic balance with unilateral UE support, functional transfers. Pt received semi-supine in bed, asleep, requiring multimodal cues to waken. No c/o pain during session. Completed myofascial release to L hip flexors and lumbar region to promote pain relief and increased mobility needed for future prosthetic use. Completed bed mobility with use of bed features with supervision, then CGA semi-squat/stand pivot to w/c.  Self-propelled in w/c > gym with supervision using BUE. Education provided about the steps to complete to position w/c and lift w/c arm rest if planning to complete squat pivot. Completed squat pivot with CGA from w/c >EOM. To promote dynamic balance needed for LB clothing management and toileting tasks, pt participated in corn hole game on each UE, with CGA, up to min A needed for balance with RW. Pt with poor balance and stability when tossing on L hand, with education provided about how to promote stability with a LE amputation, as well as how it relates to standing ADLs. Pt reporting urge to have BM after laxative given by nursing, with stand pivot using RW with min A to w/c. Transported dependently in w/c back to room, then using both grab bars, pt squat pivot from w/c <> BBSC with min A. Pt able to BUE push up on rails for therapist to doff shorts. Pt was continent of B and B. Min A needed for sit > stand using RW from commode, then CGA for balance during pericare, CGA for donning pants over hips in stance with cues needed for using alternating BUE to donn just like treatment activity. Pt was left upright in bed, with bed alarm on and all needs in reach at end of session.   Therapy Documentation Precautions:  Precautions Precautions: Fall Restrictions Weight Bearing Restrictions: No LLE Weight Bearing: Non weight bearing    Therapy/Group: Individual Therapy  Blase Mess, MS, OTR/L  08/12/2021, 7:49 AM

## 2021-08-13 NOTE — Progress Notes (Signed)
PROGRESS NOTE   Subjective/Complaints: No new complaints. Stump/phantom pain a little better today  ROS: Patient denies fever, rash, sore throat, blurred vision, dizziness, nausea, vomiting, diarrhea, cough, shortness of breath or chest pain, joint or back/neck pain, headache, or mood change.    Objective:   No results found. No results for input(s): "WBC", "HGB", "HCT", "PLT" in the last 72 hours.  No results for input(s): "NA", "K", "CL", "CO2", "GLUCOSE", "BUN", "CREATININE", "CALCIUM" in the last 72 hours.   Intake/Output Summary (Last 24 hours) at 08/13/2021 0919 Last data filed at 08/13/2021 0700 Gross per 24 hour  Intake 478 ml  Output 950 ml  Net -472 ml        Physical Exam: Vital Signs Blood pressure 138/67, pulse 81, temperature 98 F (36.7 C), temperature source Oral, resp. rate 18, height 6' (1.829 m), weight 74 kg, SpO2 95 %. Constitutional: No distress . Vital signs reviewed. HEENT: NCAT, EOMI, oral membranes moist Neck: supple Cardiovascular: RRR without murmur. No JVD    Respiratory/Chest: CTA Bilaterally without wheezes or rales. Normal effort    GI/Abdomen: BS +, non-tender, non-distended Ext: no clubbing, cyanosis, or edema Psych: pleasant and cooperative  Skin: right aka cdi, abrasion left knee.  Neuro:  Alert and oriented x3, CN 2-12 intact other than mild facial weakness, follows commands, speech and language appears to be intact, mild L facial weakness noted(chronic), BL UE strength 5/5, RLE 4+/5 Hip flex, knee extension 5/5, Ankle PF and DF 4+/5, not able to lift L residual limb, sensation decreased in stocking glove distribution RLE, mild tremor Musculoskeletal:  No joint swelling or tenderness noted, R AKA  remains sl tender to palpation around incision, decreased swelling, good shape   Assessment/Plan: 1. Functional deficits which require 3+ hours per day of interdisciplinary therapy in a  comprehensive inpatient rehab setting. Physiatrist is providing close team supervision and 24 hour management of active medical problems listed below. Physiatrist and rehab team continue to assess barriers to discharge/monitor patient progress toward functional and medical goals  Care Tool:  Bathing    Body parts bathed by patient: Right arm, Left arm, Chest, Abdomen, Right upper leg, Right lower leg, Left upper leg, Face   Body parts bathed by helper: Right arm, Left arm, Chest, Abdomen, Front perineal area, Buttocks, Right upper leg, Left upper leg, Right lower leg, Left lower leg Body parts n/a: Left lower leg (amputee)   Bathing assist Assist Level: Supervision/Verbal cueing     Upper Body Dressing/Undressing Upper body dressing   What is the patient wearing?: Pull over shirt    Upper body assist Assist Level: Set up assist    Lower Body Dressing/Undressing Lower body dressing      What is the patient wearing?: Pants     Lower body assist Assist for lower body dressing: Moderate Assistance - Patient 50 - 74%     Toileting Toileting Toileting Activity did not occur (Clothing management and hygiene only): N/A (no void or bm) (patient reports using "pee jug" prior to this session)  Toileting assist Assist for toileting: Contact Guard/Touching assist     Transfers Chair/bed transfer  Transfers assist  Chair/bed transfer assist level: Contact Guard/Touching assist     Locomotion Ambulation   Ambulation assist   Ambulation activity did not occur: Refused  Assist level: Set up assist       Walk 10 feet activity   Assist  Walk 10 feet activity did not occur: Refused  Assist level: Set up assist     Walk 50 feet activity   Assist Walk 50 feet with 2 turns activity did not occur: Refused  Assist level: Set up assist      Walk 150 feet activity   Assist Walk 150 feet activity did not occur: Refused         Walk 10 feet on uneven  surface  activity   Assist Walk 10 feet on uneven surfaces activity did not occur: Refused         Wheelchair     Assist Is the patient using a wheelchair?: Yes Type of Wheelchair: Manual    Wheelchair assist level: Supervision/Verbal cueing Max wheelchair distance: 150    Wheelchair 50 feet with 2 turns activity    Assist        Assist Level: Supervision/Verbal cueing   Wheelchair 150 feet activity     Assist      Assist Level: Supervision/Verbal cueing   Blood pressure 138/67, pulse 81, temperature 98 F (36.7 C), temperature source Oral, resp. rate 18, height 6' (1.829 m), weight 74 kg, SpO2 95 %.  Medical Problem List and Plan: 1. 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  Antithrombotics: -DVT/anticoagulation:  Pharmaceutical: Heparin             -antiplatelet therapy: N/A 3. Phantom limb pain:   hydrocodone as needed  -6/10 adjusted gabapentin to 300mg  bid  -continue massage/tactile feedback to stump, visual therapy to help with pain 4. Mood: Emotional support             -antipsychotic agents: N/A 5. Neuropsych: This patient is capable of making decisions on his own behalf. 6. Skin/Wound Care: Routine skin check 7. Fluids/Electrolytes/Nutrition: Routine in and outs with follow-up chemistries 8.  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  Status post stereotactic brain biopsy 05/23/2021.  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 Follow-up with Dr. Cecil Cobbs.  Decadron therapy as indicated, this is being tapered 9.  Seizure prophylaxis.  Keppra 500 mg twice daily. Denies recent seizure 10.   Hyperlipidemia.  Lipitor 11.  Tobacco abuse.  NicoDerm patch.  Counseling 12.  CAD/CABG.  No chest pain or shortness of breath 13. Hx of hypertension. He has renal artery narrowing. No home medications. Continue to monitor trend 14. History of CVA 15. Constipation             -on senokot,   miralax, prn dulcolax supp  6/11 multiple bm's yesterday after sorbitol + SSE   16. Leukocytosis  8/9 improved down to 10.8 yesterday 17. Severe vitamin D deficiency level 14: started ergocalciferol 50,000U once per week for 7 weeks.  18. Acute  blood loss anemia, mild  -HGB stable at 11.5 19. Hypoalbuminemia albumin 2.2  -Encouraging good nutrition, he appears to be most his meals      LOS: 4 days A FACE TO FACE EVALUATION WAS PERFORMED  Meredith Staggers 08/13/2021, 9:19 AM

## 2021-08-14 ENCOUNTER — Other Ambulatory Visit: Payer: Self-pay | Admitting: Physician Assistant

## 2021-08-14 ENCOUNTER — Inpatient Hospital Stay (HOSPITAL_COMMUNITY): Payer: No Typology Code available for payment source

## 2021-08-14 DIAGNOSIS — I10 Essential (primary) hypertension: Secondary | ICD-10-CM

## 2021-08-14 LAB — COMPREHENSIVE METABOLIC PANEL
ALT: 40 U/L (ref 0–44)
AST: 30 U/L (ref 15–41)
Albumin: 2.8 g/dL — ABNORMAL LOW (ref 3.5–5.0)
Alkaline Phosphatase: 72 U/L (ref 38–126)
Anion gap: 9 (ref 5–15)
BUN: 15 mg/dL (ref 8–23)
CO2: 27 mmol/L (ref 22–32)
Calcium: 9.2 mg/dL (ref 8.9–10.3)
Chloride: 100 mmol/L (ref 98–111)
Creatinine, Ser: 0.79 mg/dL (ref 0.61–1.24)
GFR, Estimated: >60 mL/min (ref >60)
Glucose, Bld: 104 mg/dL — ABNORMAL HIGH (ref 70–99)
Potassium: 3.9 mmol/L (ref 3.5–5.1)
Sodium: 136 mmol/L (ref 135–145)
Total Bilirubin: 0.3 mg/dL (ref 0.3–1.2)
Total Protein: 6.6 g/dL (ref 6.5–8.1)

## 2021-08-14 IMAGING — CT CT HEAD W/O CM
3 of 5 series · 14 of 47 positions shown, 16 images · non-contrast
Comparison: CT head [DATE].  MRI head [DATE].

CLINICAL DATA: Neuro deficit, acute, stroke suspected



[Series 5: head without cor · coronal · non-contrast · 0.36mm/px · 3 of 69 slices shown]
[im 23/69  brain]
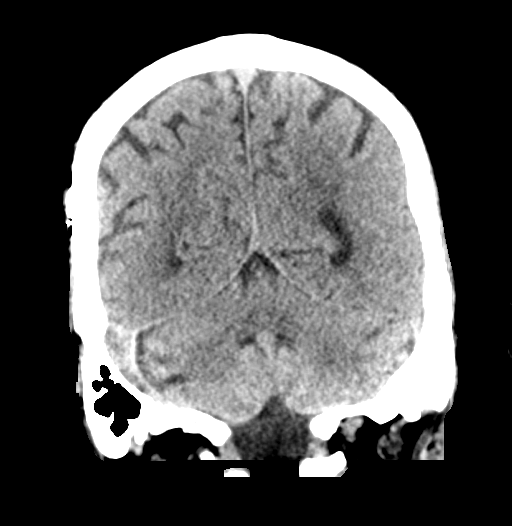
[im 31/69  brain]
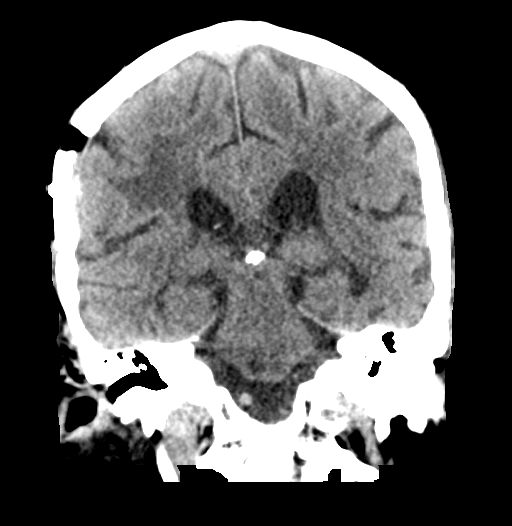
[im 38/69  brain]
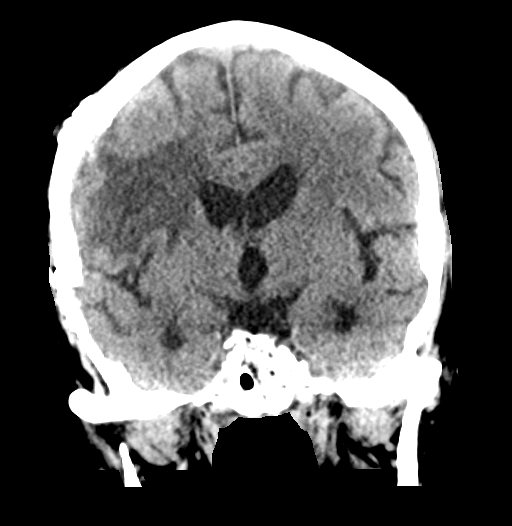

[Series 6: head without sag · sagittal · non-contrast · 0.36mm/px · 3 of 58 slices shown]
[im 20/58  brain]
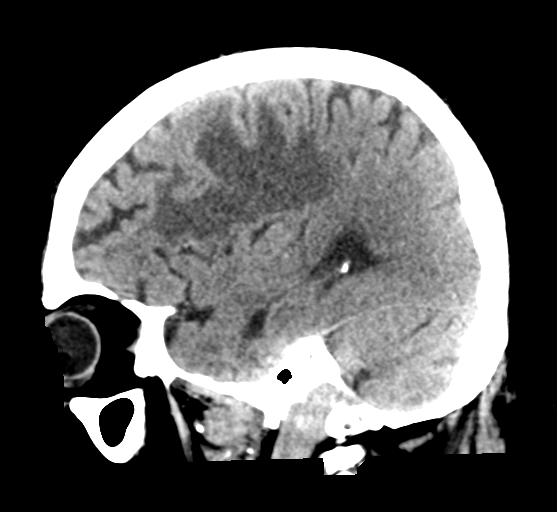
[im 29/58  brain]
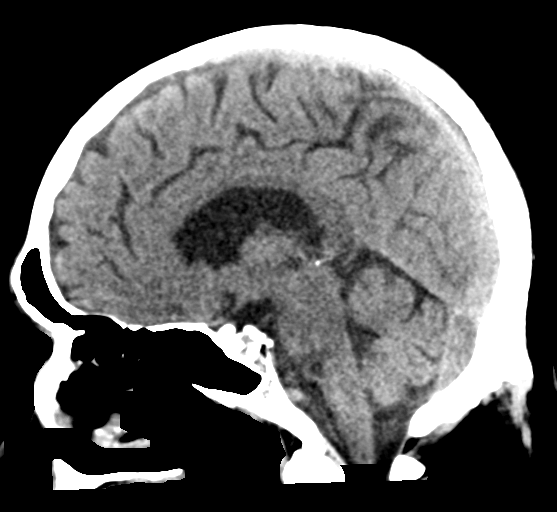
[im 38/58  brain]
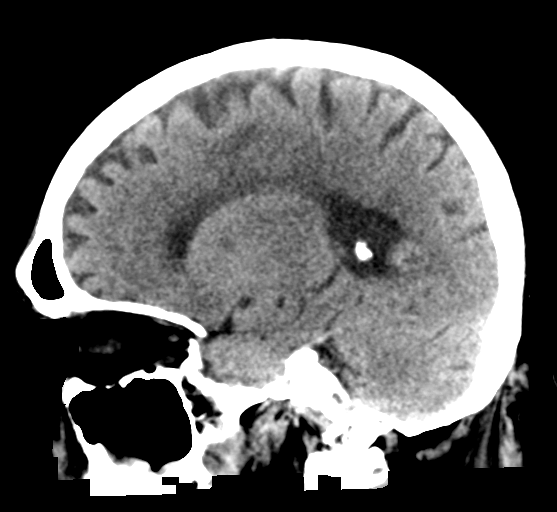

[Series 7: true axial · axial · 0.35mm/px · z∈[-102,+41]mm · 8 of 65 slices shown, 10 images]
[im 8/65  brain]
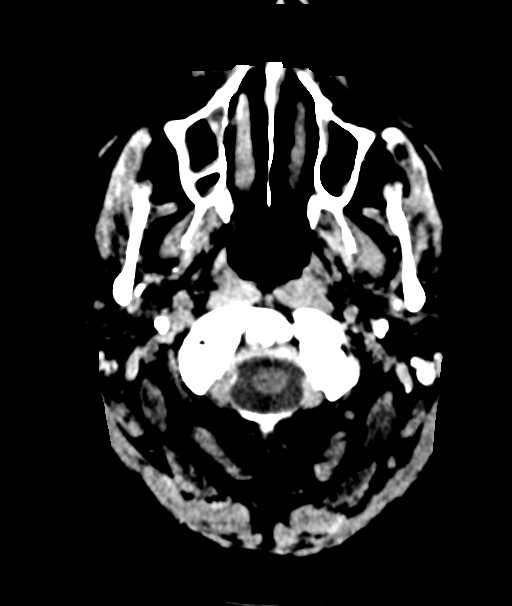
[im 8/65  bone]
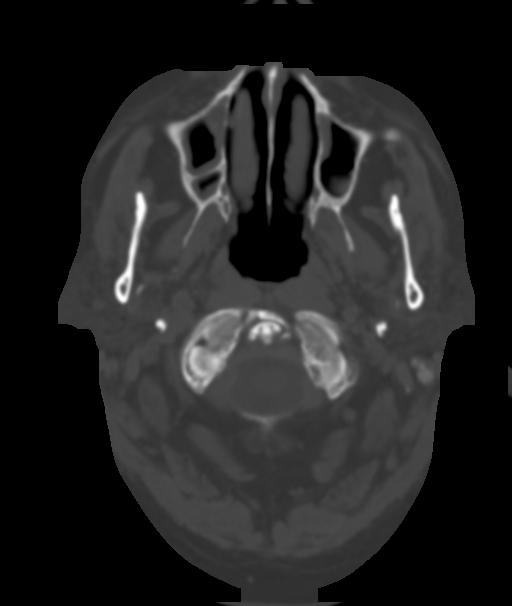
[im 15/65  brain]
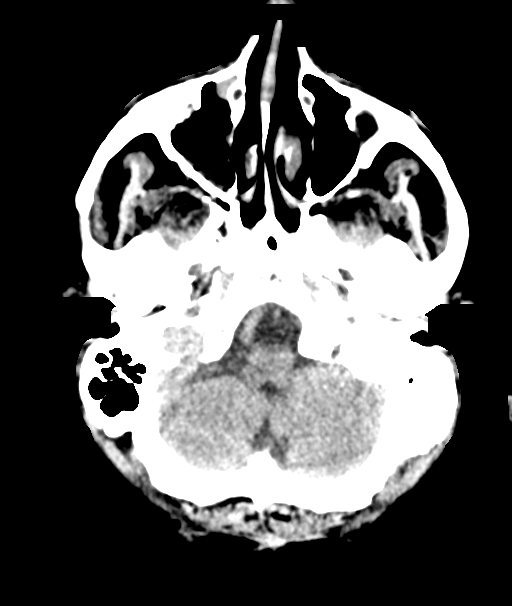
[im 22/65  brain]
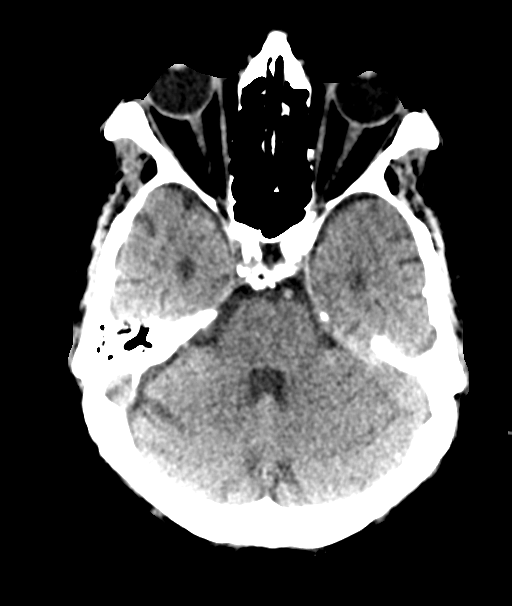
[im 29/65  brain]
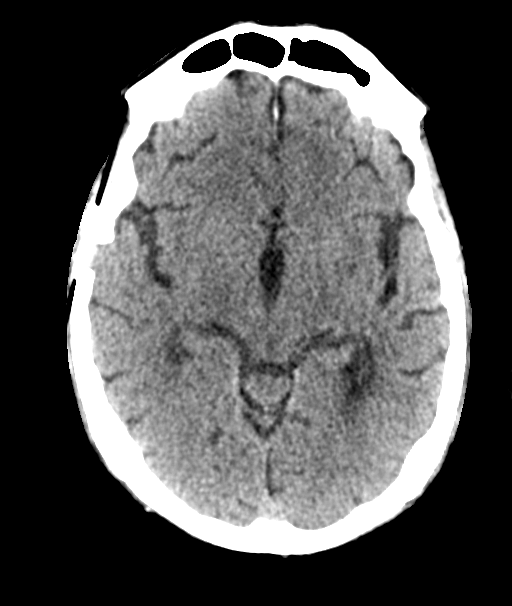
[im 36/65  brain]
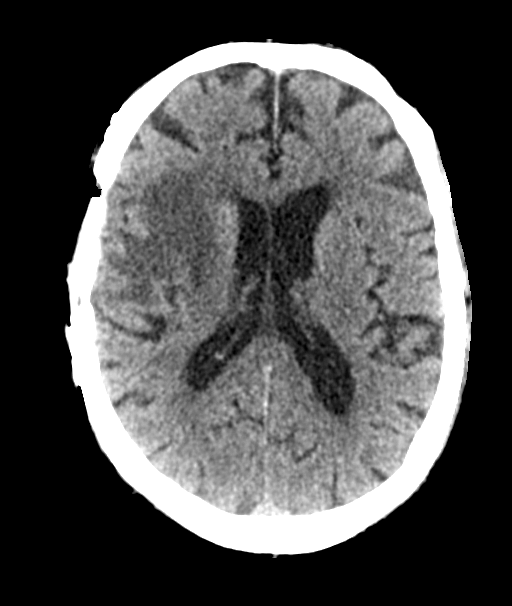
[im 36/65  bone]
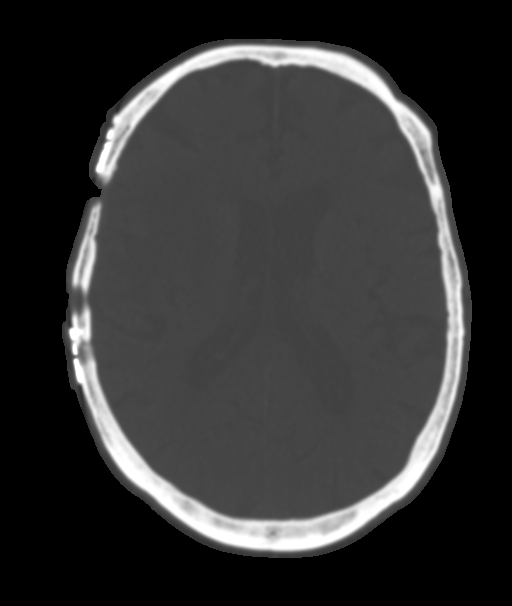
[im 43/65  brain]
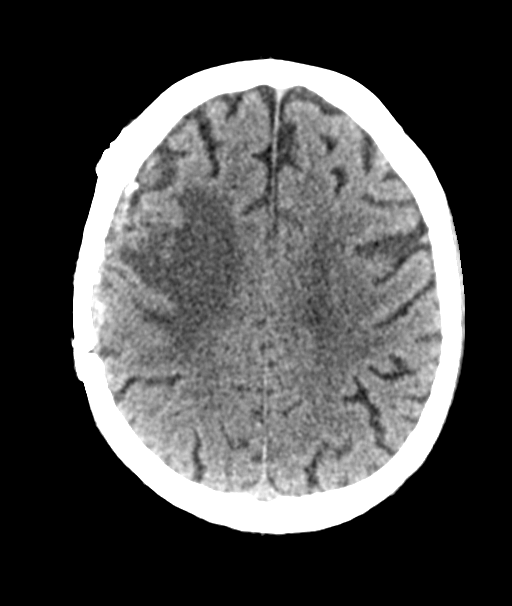
[im 50/65  brain]
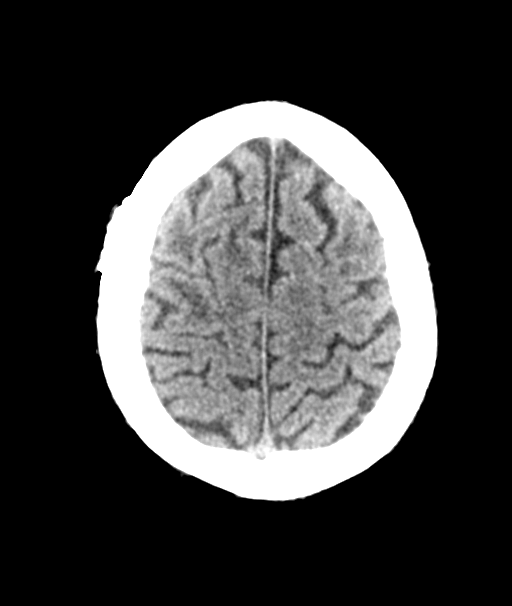
[im 57/65  brain]
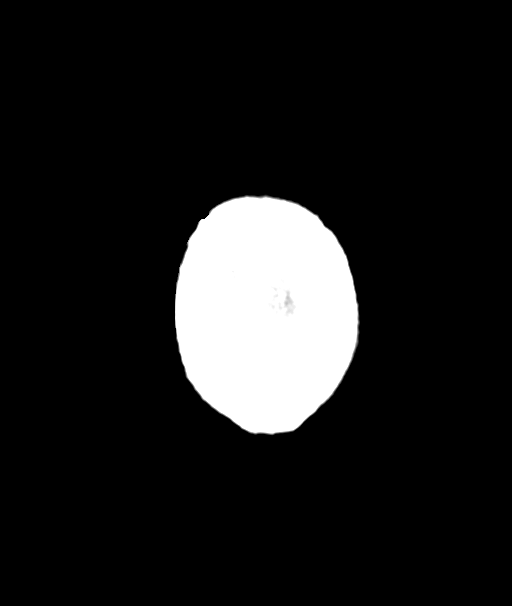

[14 of 47 positions shown; findings below may reference images not displayed]

FINDINGS: Brain: Relative to CT head from [DATE], mild increase in left
frontal vasogenic edema with similar right frontal vasogenic edema
and 7 mm focus of hyperdensity. Similar overlying dural
calcifications. No evidence of acute large vascular territory
infarct, midline shift, hydrocephalus or new/interval acute
hemorrhage.

Vascular: No hyperdense vessel identified.

Skull: Right pterional craniotomy.  No acute fracture.

Sinuses/Orbits: Scattered paranasal sinus mucosal thickening. No
acute orbital findings.

Other: No mastoid effusions.
IMPRESSION: Relative to CT head from [DATE], mild increase in left frontal
vasogenic edema with similar right frontal vasogenic edema and 7 mm
focus of hyperdensity. Findings were better characterized on recent
MRI head from [DATE].

## 2021-08-14 MED ORDER — DEXAMETHASONE 2 MG PO TABS
1.0000 mg | ORAL_TABLET | Freq: Every day | ORAL | Status: DC
  Administered 2021-08-14 – 2021-08-20 (×7): 1 mg via ORAL
  Filled 2021-08-14 (×7): qty 1

## 2021-08-14 NOTE — Progress Notes (Signed)
PROGRESS NOTE   Subjective/Complaints: No new complaints this AM.  Reports he had large BM yesterday.   ROS: No fever, chills, CP, SOB, HA.   Objective:   No results found. No results for input(s): "WBC", "HGB", "HCT", "PLT" in the last 72 hours.  No results for input(s): "NA", "K", "CL", "CO2", "GLUCOSE", "BUN", "CREATININE", "CALCIUM" in the last 72 hours.   Intake/Output Summary (Last 24 hours) at 08/14/2021 0750 Last data filed at 08/14/2021 0245 Gross per 24 hour  Intake 236 ml  Output 1250 ml  Net -1014 ml         Physical Exam: Vital Signs Blood pressure (!) 162/67, pulse 83, temperature 97.6 F (36.4 C), temperature source Oral, resp. rate 18, height 6' (1.829 m), weight 74 kg, SpO2 95 %.  Constitutional: No distress . Vital signs reviewed. HEENT: NCAT, conjugate gaze, oral membranes moist Neck: supple Cardiovascular: RRR without murmur.  Respiratory/Chest: CTA Bilaterally without wheezes or rales. Normal effort    GI/Abdomen: BS +, non-tender, non-distended Ext: no clubbing, cyanosis, or edema Psych: pleasant and cooperative  Skin: right aka cdi, abrasion left knee.  Neuro:  Alert and oriented x3, CN 2-12 intact other than mild facial weakness, follows commands, speech and language appears to be intact, mild L facial weakness noted(chronic), BL UE strength 5/5, RLE 4+/5 Hip flex, knee extension 5/5, Ankle PF and DF 4+/5, not able to lift L residual limb, sensation decreased in stocking glove distribution RLE, mild tremor Musculoskeletal:  No joint swelling or tenderness noted, R AKA  remains sl tender to palpation around incision, decreased swelling, good shape   Assessment/Plan: 1. Functional deficits which require 3+ hours per day of interdisciplinary therapy in a comprehensive inpatient rehab setting. Physiatrist is providing close team supervision and 24 hour management of active medical problems  listed below. Physiatrist and rehab team continue to assess barriers to discharge/monitor patient progress toward functional and medical goals  Care Tool:  Bathing    Body parts bathed by patient: Right arm, Left arm, Chest, Abdomen, Right upper leg, Right lower leg, Left upper leg, Face   Body parts bathed by helper: Right arm, Left arm, Chest, Abdomen, Front perineal area, Buttocks, Right upper leg, Left upper leg, Right lower leg, Left lower leg Body parts n/a: Left lower leg (amputee)   Bathing assist Assist Level: Supervision/Verbal cueing     Upper Body Dressing/Undressing Upper body dressing   What is the patient wearing?: Pull over shirt    Upper body assist Assist Level: Set up assist    Lower Body Dressing/Undressing Lower body dressing      What is the patient wearing?: Pants     Lower body assist Assist for lower body dressing: Moderate Assistance - Patient 50 - 74%     Toileting Toileting Toileting Activity did not occur (Clothing management and hygiene only): N/A (no void or bm) (patient reports using "pee jug" prior to this session)  Toileting assist Assist for toileting: Contact Guard/Touching assist     Transfers Chair/bed transfer  Transfers assist     Chair/bed transfer assist level: Contact Guard/Touching assist     Locomotion Ambulation   Ambulation assist  Ambulation activity did not occur: Refused  Assist level: Set up assist       Walk 10 feet activity   Assist  Walk 10 feet activity did not occur: Refused  Assist level: Set up assist     Walk 50 feet activity   Assist Walk 50 feet with 2 turns activity did not occur: Refused  Assist level: Set up assist      Walk 150 feet activity   Assist Walk 150 feet activity did not occur: Refused         Walk 10 feet on uneven surface  activity   Assist Walk 10 feet on uneven surfaces activity did not occur: Refused         Wheelchair     Assist Is the  patient using a wheelchair?: Yes Type of Wheelchair: Manual    Wheelchair assist level: Supervision/Verbal cueing Max wheelchair distance: 150    Wheelchair 50 feet with 2 turns activity    Assist        Assist Level: Supervision/Verbal cueing   Wheelchair 150 feet activity     Assist      Assist Level: Supervision/Verbal cueing   Blood pressure (!) 162/67, pulse 83, temperature 97.6 F (36.4 C), temperature source Oral, resp. rate 18, height 6' (1.829 m), weight 74 kg, SpO2 95 %.  Medical Problem List and Plan: 1. 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  Antithrombotics: -DVT/anticoagulation:  Pharmaceutical: Heparin             -antiplatelet therapy: N/A 3. Phantom limb pain:   hydrocodone as needed  -6/10 adjusted gabapentin to 300mg  bid  -continue massage/tactile feedback to stump, visual therapy to help with pain 4. Mood: Emotional support             -antipsychotic agents: N/A 5. Neuropsych: This patient is capable of making decisions on his own behalf. 6. Skin/Wound Care: Routine skin check 7. Fluids/Electrolytes/Nutrition: Routine in and outs with follow-up chemistries 8.  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  Status post stereotactic brain biopsy 05/23/2021.  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 Follow-up with Dr. Cecil Cobbs.  Decadron therapy as indicated, this is being tapered 9.  Seizure prophylaxis.  Keppra 500 mg twice daily. Denies recent seizure 10.  Hyperlipidemia.  Lipitor 11.  Tobacco abuse.  NicoDerm patch.  Counseling 12.  CAD/CABG.  No chest pain or shortness of  breath 13. Hx of hypertension. He has renal artery narrowing. No home medications. Continue to monitor trend -Few elevated BP but most in good range, continue to monitor trend 14. History of CVA 15. Constipation             -on senokot,   miralax, prn dulcolax supp  6/11 multiple bm's yesterday after sorbitol + SSE    6/12 improved after BM yesterday, continue to follow 16. Leukocytosis  8/9 improved down to 10.8 yesterday 17. Severe vitamin D deficiency level 14: started ergocalciferol 50,000U once per week for  7 weeks.  18. Acute blood loss anemia, mild  -HGB stable at 11.5 19. Hypoalbuminemia albumin 2.2  -6/12 appears he is eating most meals, continue to monitor      LOS: 5 days A FACE TO FACE EVALUATION WAS PERFORMED  Jennye Boroughs 08/14/2021, 7:50 AM

## 2021-08-14 NOTE — Progress Notes (Signed)
Occupational Therapy Session Note  Patient Details  Name: Roger Solis MRN: [id number] Date of Birth: [birth year]  Today's Date: 08/14/2021 OT Individual Time: 0093-8182 OT Individual Time Calculation (min): 57 min  OT missed time: 75 mins Missed time reason: CT scan for neurological change   Short Term Goals: Week 1:  OT Short Term Goal 1 (Week 1): Patient will transfer to commode with min assist OT Short Term Goal 2 (Week 1): Patient will transfer to tub transfer bench with min asssist following instruction OT Short Term Goal 3 (Week 1): Patient will dress lower body with min assist OT Short Term Goal 4 (Week 1): Patient will complete toilet hygiene and clothing management with min assist  Skilled Therapeutic Interventions/Progress Updates:  Session 1 Skilled OT intervention completed with focus on tub/shower transfers/education, dynamic standing balance, L hip stretches for contracture prevention. Pt received upright in bed, c/o 8/10 pain in L hip, pre-medicated, with therapist offering rest breaks and re-positioning for pain reduction. Completed bed mobility with mod I to EOB, then squat pivot > L with supervision. Self-propelled in w/c with supervision using BUE > ADL apartment > ortho gym, then dependent transfer back to room 2/2 fatigue. Education provided on DME available such as tub bench that can be purchased for use at home, suggested use of [facility] for ease of bathing, shower curtain management to prevent water spillage, minimizing stands via lateral leans for pericare, use of grab bars/installation as well as effective safety strategies for exiting the shower to eliminate falls. Pt able to position w/c with supervision/cues, lateral scoot <> bench and manage RLE over threshold with supervision. Discussed using lateral scoot method vs stand pivot with RW to maximize independence vs having to carry walker in addition to managing w/c positioning. Standing at [facility], pt completed the  following to promote dynamic balance for standing ADL tasks: dot speed reaction test with R hand at 1.62 sec reaction speed and L hand at 2.21 sec reaction speed. Pt required supervision for standing balance with standard walker with R hand, however up to min A for balance when using L. Education provided about further practice needed for unilateral practice with using L hand to increase stability as the L is the amputation side. Completed squat pivot > L with supervision to EOM, then able to lay back with supervision into supine. Pt completed x10 L hip extension stretches with 10 sec hold, with therapist lightly providing pressure as pt with limitations of back of thigh reaching mat table. Transitioned to prone with supervision, then completed x10 leg raises with 3 sec hold with therapist facilitating at quad and glute muscles to promote further lift off. Supervision A from prone to EOM then same assist as above for squat pivot to w/c. Pt was left seated in w/c in room, with chair alarm on and all needs in reach at end of session.   Session 2 Attempted to see pt for OT intervention however pt was not in room. Family and nursing both reported pt was gone to CT scan to rule out neurological changes as pt was "disoriented and not like himself." Pt missed 75 mins of OT intervention 2/2 CT scan.   Therapy Documentation Precautions:  Precautions Precautions: Fall Restrictions Weight Bearing Restrictions: No LLE Weight Bearing: Non weight bearing    Therapy/Group: Individual Therapy  Blase Mess, MS, OTR/L  08/14/2021, 7:32 AM

## 2021-08-14 NOTE — Progress Notes (Signed)
Orthopedic Tech Progress Note Patient Details:  Roger Solis 26-Jan-1956 [id number]  Called in order to [employer] for another AKA SHRINKER   Patient ID: Roger Solis, male   DOB: Mar 24, 1955, 66 y.o.   MRN: [id number]  Roger Solis 08/14/2021, 10:31 AM

## 2021-08-14 NOTE — Progress Notes (Signed)
Family reported increased confusion, slurred speech and left facial droop. Pt is alert and oriented to person, place, and situation. Vitals are at baseline. Linna Hoff, PA made aware. New orders in place.    Gerald Stabs, RN

## 2021-08-14 NOTE — Progress Notes (Signed)
Physical Therapy Session Note  Patient Details  Name: Roger Solis MRN: [id number] Date of Birth: 1956/01/10  Today's Date: 08/14/2021 PT Individual Time: 1030-1125 PT Individual Time Calculation (min): 55 min   Short Term Goals: Week 1:  PT Short Term Goal 1 (Week 1): STG=LTG due to LOS  Skilled Therapeutic Interventions/Progress Updates:  Received pt sitting in WC, [name], and reported pain 9/10 in L residual limb - RN notified to administer pain medication but did not arrive until end of session. Session with emphasis on functional mobility/transfers, generalized strengthening and endurance, and dynamic standing balance/coordination. Discussed home equipment and confirmed that pt only has a transport chair; therefore will require manual WC upon discharge. Pt transferred sit<>stand with RW and CGA and therapist' placed pillow under L hip for comfort. Pt performed WC mobility 111ft using BUE and supervision to dayroom and transferred WC<>mat squat<>pivot to L with CGA. Pt transferred sit<>supine with supervision and performed the following exercises with supervision and verbal cues for technique with emphasis on LE strength, ROM, and L hip flexor stretching: -R single leg bridges 2x10 -LLE abduction 2x10 -R sidelying L hip abduction 2x10 -R sidelying L hip extension 2x10 Pt unable to tolerate prone this morning due to high levels of pain. Provided pt with heat packs and pt laid flat in supine to stretch L hip flexors/allow pain to subside. Pt transferred supine<>long sitting<>sitting EOM with supervision and transferred mat<>WC squat<>pivot with CGA. Pt transported back to room dependently and requested to return to bed. Pt transferred WC<>bed squat<>pivot with CGA. Concluded session with pt sitting EOB, needs within reach, and bed alarm on. Provided pt with drink and assisted with positioning heat packs along L hip for pain relief.   Therapy Documentation Precautions:   Precautions Precautions: Fall Restrictions Weight Bearing Restrictions: No LLE Weight Bearing: Non weight bearing  Therapy/Group: Individual Therapy Alfonse Alpers PT, DPT  08/14/2021, 7:05 AM

## 2021-08-14 NOTE — Progress Notes (Signed)
CT scan today 08/14/2021 as compared to April 15 shows mild increase left frontal vasogenic edema with similar right frontal vasogenic edema 7 mm focus of hyperdensity.  With these noted findings will resume low-dose Decadron and plan to discuss with medical oncology Dr. Mickeal Skinner as needed

## 2021-08-15 ENCOUNTER — Inpatient Hospital Stay (HOSPITAL_COMMUNITY): Payer: No Typology Code available for payment source

## 2021-08-15 DIAGNOSIS — R569 Unspecified convulsions: Secondary | ICD-10-CM

## 2021-08-15 MED ORDER — LEVETIRACETAM 750 MG PO TABS
750.0000 mg | ORAL_TABLET | Freq: Two times a day (BID) | ORAL | Status: DC
  Administered 2021-08-15 – 2021-08-20 (×10): 750 mg via ORAL
  Filled 2021-08-15 (×10): qty 1

## 2021-08-15 MED ORDER — NYSTATIN 100000 UNIT/GM EX POWD
Freq: Three times a day (TID) | CUTANEOUS | Status: DC
  Filled 2021-08-15 (×2): qty 15

## 2021-08-15 MED ORDER — AMLODIPINE BESYLATE 2.5 MG PO TABS
2.5000 mg | ORAL_TABLET | Freq: Every day | ORAL | Status: DC
Start: 2021-08-15 — End: 2021-08-18
  Administered 2021-08-15 – 2021-08-18 (×4): 2.5 mg via ORAL
  Filled 2021-08-15 (×4): qty 1

## 2021-08-15 MED ORDER — LEVETIRACETAM 250 MG PO TABS
250.0000 mg | ORAL_TABLET | Freq: Once | ORAL | Status: AC
  Administered 2021-08-15: 250 mg via ORAL
  Filled 2021-08-15: qty 1

## 2021-08-15 NOTE — Plan of Care (Signed)
  Problem: RH Ambulation Goal: LTG Patient will ambulate in controlled environment (PT) Description: LTG: Patient will ambulate in a controlled environment, # of feet with assistance (PT). Outcome: Not Applicable Flowsheets (Taken 08/15/2021 0706) LTG: Pt will ambulate in controlled environ  assist needed:: (D/C) -- Note: D/C Goal: LTG Patient will ambulate in home environment (PT) Description: LTG: Patient will ambulate in home environment, # of feet with assistance (PT). Outcome: Not Applicable Flowsheets (Taken 08/15/2021 0706) LTG: Pt will ambulate in home environ  assist needed:: (D/C) -- Note: D/C

## 2021-08-15 NOTE — Progress Notes (Signed)
Physical Therapy Session Note  Patient Details  Name: Roger Solis MRN: [id number] Date of Birth: 1955/12/23  Today's Date: 08/15/2021 PT Individual Time: 1002-1018 and 1400-1453 PT Individual Time Calculation (min): 16 min  and 53 min Today's Date: 08/15/2021 PT Missed Time: 59 Minutes Missed Time Reason: Patient fatigue (fatigue from having seizure earlier in morning)  Short Term Goals: Week 1:  PT Short Term Goal 1 (Week 1): STG=LTG due to LOS  Skilled Therapeutic Interventions/Progress Updates:   Treatment Session 1 Received pt semi-reclined in bed with daughter present at bedside. Per OT, pt had seizure this morning and now on seizure precautions - noticed mild L facial drooping and flushed cheeks but pt reported feeling better. RN arrived to administer medications and assess vitals BP: 180/86 and HR 81bpm - improved since earlier this morning. Pt politely declined participating in morning PT session but agreed to attempt PM session. Discussed stair navigation and daughter verbalized confidence bumping pt up/down steps in [facility] (as she has been doing this with transport chair). Confirmed with daughter that pt will receive 16x16 manual WC and reviewed how to fold up chair and don/doff legrests/armrests. Discussed recommendation for HHPT and notified CSW to discuss in further detail. Concluded session with pt semi-reclined in bed, needs within reach, and bed alarm on. Daughter present at bedside. 59 minutes missed of skilled physical therapy due to fatigue from seizure earlier this morning.   Treatment Session 2 Received pt sitting EOB reporting feeling much better after this morning's incident. [name] and reported pain 6/10 in L residual limb - RN notified and present to administer pain medication. Session with emphasis on functional mobility/transfers, generalized strengthening and endurance, dynamic standing balance/coordination. Pt transferred bed<>WC squat<>pivot with  CGA and performed WC mobility 160ft using BUE and supervision to dayroom. Pt transferred on/off Nustep squat<>pivot with CGA/close supervision and performed BUE/RLE strengthening on Nustep at workload 5 for 8 minutes for a total of 258 steps with emphasis on cardiovascular endurance. PA, Dan, arrived during session to check on pt. Pt transferred sit<>stand with standard walker and close supervision x 7 trials and performed the following exercises with BUE support and close supervision with emphasis on balance, strength, and ROM: -L hip flexion 2x15 -L hip extension 2x15 -R single leg heel raises 2x10 -L hip abduction 2x15 -seated WC pushups 2x10 Pt transported back to room in [facility] dependently and requested to return to bed. Pt transferred WC<>bed squat<>pivot with close supervision, doffed shoe with total A, and transferred sit<>supine with supervision/mod I. Concluded session with pt semi-reclined in bed, needs within reach, and bed alarm on. Provided pt with ice.   Therapy Documentation Precautions:  Precautions Precautions: Fall Restrictions Weight Bearing Restrictions: No LLE Weight Bearing: Non weight bearing  Therapy/Group: Individual Therapy Alfonse Alpers PT, DPT  08/15/2021, 7:02 AM

## 2021-08-15 NOTE — Progress Notes (Addendum)
Patient ID: Roger Solis, male   DOB: 06-29-1955, 66 y.o.   MRN: [id number]  Sw spoke with patient daughter and informed her of current recommendations for patient and the process of obtaining services from [place]. No additional questions or concerns, sw will continue to follow up.

## 2021-08-15 NOTE — Procedures (Signed)
Patient Name: Roger Solis  MRN: [id number]  Epilepsy Attending: [name]  Referring Physician/Provider: [name], LPN  Date: 2/94/7654 Duration: 27.03 mins  Patient history: 66 year old male with history of brain mets, seizures noted to have seizure like episode.  EEG to evaluate for seizure.  Level of alertness: Awake  AEDs during EEG study: LEV, GBP  Technical aspects: This EEG study was done with scalp electrodes positioned according to the 10-20 International system of electrode placement. Electrical activity was acquired at a sampling rate of 500Hz  and reviewed with a high frequency filter of 70Hz  and a low frequency filter of 1Hz . EEG data were recorded continuously and digitally stored.   Description: The posterior dominant rhythm consists of 8 Hz activity of moderate voltage (25-35 uV) seen predominantly in posterior head regions, symmetric and reactive to eye opening and eye closing. EEG showed intermittent sharply contoured 3 to 6 Hz 11 delta slowing admixed with 15 to 18 Hz beta activity in her right centro-parietal region consistent with breach artifact.  Hyperventilation and photic stimulation were not performed.     ABNORMALITY -Breach artifact, right centro-parietal region   IMPRESSION: This study is suggestive of cortical dysfunction arising from right centro-parietal region likely secondary to underlying craniotomy. No seizures or epileptiform discharges were seen throughout the recording.  [name]

## 2021-08-15 NOTE — Progress Notes (Signed)
Occupational Therapy Session Note  Patient Details  Name: Roger Solis MRN: [id number] Date of Birth: 1955/05/20  Today's Date: 08/15/2021 OT Individual Time: 0804-0900 OT Individual Time Calculation (min): 56 min    Short Term Goals: Week 1:  OT Short Term Goal 1 (Week 1): Patient will transfer to commode with min assist OT Short Term Goal 2 (Week 1): Patient will transfer to tub transfer bench with min asssist following instruction OT Short Term Goal 3 (Week 1): Patient will dress lower body with min assist OT Short Term Goal 4 (Week 1): Patient will complete toilet hygiene and clothing management with min assist  Skilled Therapeutic Interventions/Progress Updates:  Skilled OT intervention completed with focus on functional endurance, ADL retraining, family education. Pt received EOB with daughter in room, pt with no c/o pain. Pt reported feeling better this session, requesting to take shower. Education provided to pt's daughter throughout about pt's assist level of supervision for transfers and ADL tasks, recommended DME (drop arm BSC for squat pivot), water proof covering method for residual limb for showers if staples still present, and ways to maximize safety at home. Completed squat pivot from EOB > w/c with supervision, then dependent transfer to shower chair, with semi-stand pivot with use of grab bars with CGA from w/c <> shower chair. Doffed clothing at the sit > stand level with supervision. Completed bathing at the supervision level with residual limb covered 2/2 staples. Nurse in room to donn new sacral pad 2/2 sacral pressure sores with pt able to maintain stance position for >5 mins using BUE on grab bars with supervision A. Nurse made aware of peri-area moist powder build up and itch per pt report. Donned clothing at the supervision level for sit > stand.   Seated in w/c, therapist attempted donning new shrinker, however pt with immediate slurring of speech, inability to respond  to therapist's questions, eyes with rapid eye movement and pt moaning. Daughter reporting that the symptoms appeared exactly like his seizures with pt on seizure precautions. Pt was in safe position seated in w/c, with daughter in front of pt to prevent forward fall, conscious, with therapist alerting nursing and MD about pt status. Therapist billed for rest of session for advising care team on events, as well as assisting pt to bed. Seizure duration was about 3 mins in length, with pt coming to baseline orientation after it subsided with care team present, however presentable L facial droop. Pt reportedly able to squat pivot himself, with pt able to at [facility] to get to bed for safe position in case of another seizure. Total A placement of size small shrinker (with better fit), with education provided about method of donning. Pt was left upright in bed with bed alarm on and all needs in reach, and daughter at bedside at end of session.   Therapy Documentation Precautions:  Precautions Precautions: Fall Restrictions Weight Bearing Restrictions: No LLE Weight Bearing: Non weight bearing    Therapy/Group: Individual Therapy  Blase Mess, MS, OTR/L  08/15/2021, 7:25 AM

## 2021-08-15 NOTE — Progress Notes (Signed)
EEG complete - results pending 

## 2021-08-15 NOTE — Progress Notes (Signed)
PROGRESS NOTE   Subjective/Complaints: He reported this AM he is feeling better today and he feels back to normal. He had CT head yesterday as family reported his speech and mentation was altered from current baseline. Later in the morning he had a seizure.  He has had multiple seizures like this in the past.   ROS: No fever, chills, CP, SOB, HA, Abd pain, Nausea   Objective:   CT HEAD WO CONTRAST (5MM)  Result Date: 08/14/2021 CLINICAL DATA:  Neuro deficit, acute, stroke suspected EXAM: CT HEAD WITHOUT CONTRAST TECHNIQUE: Contiguous axial images were obtained from the base of the skull through the vertex without intravenous contrast. RADIATION DOSE REDUCTION: This exam was performed according to the departmental dose-optimization program which includes automated exposure control, adjustment of the mA and/or kV according to patient size and/or use of iterative reconstruction technique. COMPARISON:  CT head June 17, 2021.  MRI head Jul 14, 2021. FINDINGS: Brain: Relative to CT head from April 15, mild increase in left frontal vasogenic edema with similar right frontal vasogenic edema and 7 mm focus of hyperdensity. Similar overlying dural calcifications. No evidence of acute large vascular territory infarct, midline shift, hydrocephalus or new/interval acute hemorrhage. Vascular: No hyperdense vessel identified. Skull: Right pterional craniotomy.  No acute fracture. Sinuses/Orbits: Scattered paranasal sinus mucosal thickening. No acute orbital findings. Other: No mastoid effusions. IMPRESSION: Relative to CT head from April 15, mild increase in left frontal vasogenic edema with similar right frontal vasogenic edema and 7 mm focus of hyperdensity. Findings were better characterized on recent MRI head from May 12. Electronically Signed   By: Margaretha Sheffield M.D.   On: 08/14/2021 14:51   No results for input(s): "WBC", "HGB", "HCT", "PLT" in the  last 72 hours.  Recent Labs    08/14/21 1509  NA 136  K 3.9  CL 100  CO2 27  GLUCOSE 104*  BUN 15  CREATININE 0.79  CALCIUM 9.2     Intake/Output Summary (Last 24 hours) at 08/15/2021 1205 Last data filed at 08/15/2021 0753 Gross per 24 hour  Intake 460 ml  Output 1150 ml  Net -690 ml      Pressure Injury 08/09/21 Buttocks Left Stage 2 -  Partial thickness loss of dermis presenting as a shallow open injury with a red, pink wound bed without slough. Top wound (Active)  08/09/21 1830  Location: Buttocks  Location Orientation: Left  Staging: Stage 2 -  Partial thickness loss of dermis presenting as a shallow open injury with a red, pink wound bed without slough.  Wound Description (Comments): Top wound  Present on Admission: Yes     Pressure Injury 08/09/21 Buttocks Left Stage 2 -  Partial thickness loss of dermis presenting as a shallow open injury with a red, pink wound bed without slough. Middle wound (Active)  08/09/21 1830  Location: Buttocks  Location Orientation: Left  Staging: Stage 2 -  Partial thickness loss of dermis presenting as a shallow open injury with a red, pink wound bed without slough.  Wound Description (Comments): Middle wound  Present on Admission: Yes     Pressure Injury 08/09/21 Buttocks Left Stage 2 -  Partial  thickness loss of dermis presenting as a shallow open injury with a red, pink wound bed without slough. Bottom wound (Active)  08/09/21 1830  Location: Buttocks  Location Orientation: Left  Staging: Stage 2 -  Partial thickness loss of dermis presenting as a shallow open injury with a red, pink wound bed without slough.  Wound Description (Comments): Bottom wound  Present on Admission: Yes    Physical Exam: Vital Signs Blood pressure (!) 180/86, pulse 81, temperature 98.2 F (36.8 C), temperature source Oral, resp. rate 18, height 6' (1.829 m), weight 74 kg, SpO2 100 %.  Constitutional: No distress . Vital signs reviewed. Working with  therapy HEENT: NCAT, conjugate gaze, oral membranes moist Neck: supple Cardiovascular: RRR without murmur.  Respiratory/Chest: CTA Bilaterally without wheezes or rales. Normal effort    GI/Abdomen: BS +, non-tender, non-distended Ext: no clubbing, cyanosis, or edema Psych: pleasant and cooperative  Skin: right aka cdi, abrasion left knee. Warm and dry Neuro:  Alert and oriented x3, CN 2-12 intact other than mild facial weakness, follows commands, speech and language appears to be intact, mild L facial weakness noted(chronic), BL UE strength 5/5, RLE 4+/5 Hip flex, knee extension 5/5, Ankle PF and DF 4+/5, not able to lift L residual limb, sensation decreased in stocking glove distribution RLE, mild tremor Musculoskeletal:  R AKA  remains sl tender to palpation around incision, decreased swelling, good shape   Assessment/Plan: 1. Functional deficits which require 3+ hours per day of interdisciplinary therapy in a comprehensive inpatient rehab setting. Physiatrist is providing close team supervision and 24 hour management of active medical problems listed below. Physiatrist and rehab team continue to assess barriers to discharge/monitor patient progress toward functional and medical goals  Care Tool:  Bathing    Body parts bathed by patient: Right arm, Left arm, Chest, Abdomen, Right upper leg, Right lower leg, Left upper leg, Face, Front perineal area, Buttocks   Body parts bathed by helper: Right arm, Left arm, Chest, Abdomen, Front perineal area, Buttocks, Right upper leg, Left upper leg, Right lower leg, Left lower leg Body parts n/a: Left lower leg   Bathing assist Assist Level: Supervision/Verbal cueing     Upper Body Dressing/Undressing Upper body dressing   What is the patient wearing?: Pull over shirt    Upper body assist Assist Level: Independent    Lower Body Dressing/Undressing Lower body dressing      What is the patient wearing?: Pants     Lower body assist  Assist for lower body dressing: Contact Guard/Touching assist     Toileting Toileting Toileting Activity did not occur (Clothing management and hygiene only): N/A (no void or bm) (patient reports using "pee jug" prior to this session)  Toileting assist Assist for toileting: Contact Guard/Touching assist     Transfers Chair/bed transfer  Transfers assist     Chair/bed transfer assist level: Contact Guard/Touching assist     Locomotion Ambulation   Ambulation assist   Ambulation activity did not occur: Refused  Assist level: Set up assist       Walk 10 feet activity   Assist  Walk 10 feet activity did not occur: Refused  Assist level: Set up assist     Walk 50 feet activity   Assist Walk 50 feet with 2 turns activity did not occur: Refused  Assist level: Set up assist      Walk 150 feet activity   Assist Walk 150 feet activity did not occur: Refused  Walk 10 feet on uneven surface  activity   Assist Walk 10 feet on uneven surfaces activity did not occur: Refused         Wheelchair     Assist Is the patient using a wheelchair?: Yes Type of Wheelchair: Manual    Wheelchair assist level: Supervision/Verbal cueing Max wheelchair distance: 150    Wheelchair 50 feet with 2 turns activity    Assist        Assist Level: Supervision/Verbal cueing   Wheelchair 150 feet activity     Assist      Assist Level: Supervision/Verbal cueing   Blood pressure (!) 180/86, pulse 81, temperature 98.2 F (36.8 C), temperature source Oral, resp. rate 18, height 6' (1.829 m), weight 74 kg, SpO2 100 %.  Medical Problem List and Plan: 1. 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  Antithrombotics: -DVT/anticoagulation:  Pharmaceutical: Heparin             -antiplatelet therapy: N/A 3. Phantom limb pain:   hydrocodone as needed  -6/10 adjusted gabapentin to 300mg  bid  -continue massage/tactile feedback to stump, visual therapy to help with pain 4. Mood: Emotional support             -antipsychotic agents: N/A 5. Neuropsych: This patient is capable of making decisions on his own behalf. 6. Skin/Wound Care: Routine skin check 7. Fluids/Electrolytes/Nutrition: Routine in and outs with follow-up chemistries 8.  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  Status post stereotactic brain biopsy 05/23/2021.  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 Follow-up with Dr. Cecil Cobbs.  Decadron therapy as indicated, this is being tapered -6/13 CT head yesterday with  mild increase left frontal vasogenic edema, low dose decadron resumed  9.  Seizure prophylaxis.  Keppra 500 mg twice daily. Denies recent seizure -6/13 seizure today, no LOC, facial and left arm movements noted, EEG ordered, Keppra dose increased to 750mg  BID, IV line to be started 10.  Hyperlipidemia.  Lipitor 11.  Tobacco abuse.  NicoDerm patch.  Counseling 12.  CAD/CABG.  No chest pain or shortness of breath 13. Hx of hypertension. He has renal artery narrowing. No home medications. Continue to monitor trend -Will add low dose amlodipine 2.5mg  14. History of CVA 15. Constipation             -on senokot,   miralax, prn dulcolax supp  6/11 multiple bm's yesterday after sorbitol + SSE    6/12 improved after BM yesterday, continue to follow 16. Leukocytosis  8/9 improved down to 10.8 yesterday 17. Severe vitamin D deficiency level 14: started ergocalciferol 50,000U once per week for 7 weeks.  18. Acute blood loss anemia,  mild  -HGB stable at 11.5 19. Hypoalbuminemia albumin 2.2  -6/12 appears he is eating most meals, continue to monitor  -6/13 Improved to 2.8, continue to monitor    LOS: 6 days A FACE  TO FACE EVALUATION WAS PERFORMED  Jennye Boroughs 08/15/2021, 12:05 PM

## 2021-08-15 NOTE — Progress Notes (Signed)
Pt experienced seizure like activity during OT session. Activity started at 0846 and ended at 16. No other activity noted. No loss of conscious noted. Vitals obtained. BP elevated. Broadus John and MD Marciano Sequin at bedside. No complications noted. New orders received. [name], LPN

## 2021-08-16 DIAGNOSIS — L8990 Pressure ulcer of unspecified site, unspecified stage: Secondary | ICD-10-CM | POA: Insufficient documentation

## 2021-08-16 DIAGNOSIS — Z9889 Other specified postprocedural states: Secondary | ICD-10-CM

## 2021-08-16 NOTE — Progress Notes (Signed)
Patient ID: Roger Solis, male   DOB: [birth year], 66 y.o.   MRN: [id number]  Sw left VM for patient covering [facility], Vladimir Creeks in reference to discharge recommendations. Sw waiting on follow up of status.

## 2021-08-16 NOTE — Progress Notes (Signed)
Occupational Therapy Discharge Summary  Patient Details  Name: Generoso Cropper MRN: [id number] Date of Birth: 06-04-1955  Patient has met 10 of 12 long term goals due to improved activity tolerance, improved balance, ability to compensate for deficits, improved awareness, and improved coordination.  Patient to discharge at overall Modified Independent to supervision level for ADLs and functional transfers. Patient's care partner is independent to provide the necessary physical assistance at discharge and has been present for family education training on 6/13 and 6/14 with adequate supervision assist provided.  Reasons goals not met: Patient still requires supervision for sit<>stands and standing balance for safety.  Recommendation:  No follow up OT, HEP provided  Equipment: Already has TTB and BSC  Reasons for discharge: treatment goals met  Patient/family agrees with progress made and goals achieved: Yes  OT Discharge Precautions/Restrictions  Precautions Precautions: Fall Restrictions Weight Bearing Restrictions: Yes LLE Weight Bearing: Non weight bearing ADL ADL Eating: Independent Where Assessed-Eating: Wheelchair Grooming: Modified independent Where Assessed-Grooming: Sitting at sink Upper Body Bathing: Modified independent Where Assessed-Upper Body Bathing: Shower Lower Body Bathing: Supervision/safety Where Assessed-Lower Body Bathing: Shower Upper Body Dressing: Modified independent (Device) Where Assessed-Upper Body Dressing: Edge of bed Lower Body Dressing: Supervision/safety Where Assessed-Lower Body Dressing: Sitting at sink, Standing at sink Toileting: Modified independent (simulated d/t pt did not need to go) Where Assessed-Toileting: Glass blower/designer: Modified independent (secondary to hip pain) Toilet Transfer Method: Engineer, water: Grab bars Tub/Shower Transfer: Close supervison Clinical cytogeneticist Method: Child psychotherapist: Radio broadcast assistant, Energy manager: Close supervision Social research officer, government Method: Education officer, environmental: Civil engineer, contracting with back, Engineer, drilling: Within Functional Limits Praxis Praxis: Intact Cognition Cognition Overall Cognitive Status: Within Functional Limits for tasks assessed Arousal/Alertness: Awake/alert Memory: Appears intact Memory Impairment: Retrieval deficit Attention: Sustained Sustained Attention: Appears intact Awareness: Appears intact Awareness Impairment: Anticipatory impairment Problem Solving: Appears intact Problem Solving Impairment: Functional basic Safety/Judgment: Appears intact Comments: pt is slightly HOH Brief Interview for Mental Status (BIMS) Repetition of Three Words (First Attempt): 3 Temporal Orientation: Year: Correct Temporal Orientation: Month: Accurate within 5 days Temporal Orientation: Day: Correct Recall: "Sock": Yes, no cue required Recall: "Blue": Yes, no cue required Recall: "Bed": Yes, no cue required BIMS Summary Score: 15 Sensation Sensation Light Touch: Impaired Detail Proprioception: Appears Intact Additional Comments: hx of neuropathy. Pt reports phantom pain along L residual limb Coordination Gross Motor Movements are Fluid and Coordinated: Yes Fine Motor Movements are Fluid and Coordinated: Yes Coordination and Movement Description: pain, altered balance strategies 2/2 L AKA, and generalized weakness/deconditioning Finger Nose Finger Test: [facility] but slow Heel Shin Test: unable to perform on LLE due to AKA Mobility  Bed Mobility Bed Mobility: Rolling Right;Rolling Left;Sit to Supine;Supine to Sit Rolling Right: Independent with assistive device Rolling Left: Independent with assistive device Supine to Sit: Independent with assistive device Sit to Supine: Independent with assistive device Transfers Sit to Stand: Supervision/Verbal  cueing Stand to Sit: Supervision/Verbal cueing  Trunk/Postural Assessment  Cervical Assessment Cervical Assessment: Within Functional Limits Thoracic Assessment Thoracic Assessment: Exceptions to [facility] (thoracic rounding) Lumbar Assessment Lumbar Assessment: Exceptions to [facility] (posterior pelvic tilt) Postural Control Postural Control: Within Functional Limits Protective Responses: delayed due to L AKA  Balance Balance Balance Assessed: Yes Static Sitting Balance Static Sitting - Balance Support: Feet supported;Bilateral upper extremity supported Static Sitting - Level of Assistance: 7: Independent Dynamic Sitting Balance Dynamic Sitting - Balance Support: Feet supported;No  upper extremity supported Dynamic Sitting - Level of Assistance: 6: Modified independent (Device/Increase time) Dynamic Sitting Balance - Compensations: leans backward Dynamic Sitting - Balance Activities: Lateral lean/weight shifting;Forward lean/weight shifting Sitting balance - Comments: able to reach to foot when seated in wheelchair - used one hand as counterbalance Static Standing Balance Static Standing - Balance Support: Bilateral upper extremity supported (standard walker) Static Standing - Level of Assistance: 5: Stand by assistance (supervision) Dynamic Standing Balance Dynamic Standing - Balance Support: Bilateral upper extremity supported (standard walker) Dynamic Standing - Level of Assistance: 5: Stand by assistance (close supervision) Extremity/Trunk Assessment RUE Assessment RUE Assessment: Within Functional Limits LUE Assessment LUE Assessment: Within Functional Limits   Hope E Kluttz, MS, OTR/L  08/19/2021, 1:49 PM

## 2021-08-16 NOTE — Patient Care Conference (Signed)
Inpatient RehabilitationTeam Conference and Plan of Care Update Date: 08/16/2021   Time: 11:35 AM    Patient Name: Roger Solis      Medical Record Number: [id number]  Date of Birth: May 24, 1955 Sex: Male         Room/Bed: 3N36R/4E31V-40 Payor Info: Payor: [employer] / Plan: [place] / Product Type: *No Product type* /    Admit Date/Time:  08/09/2021  3:25 PM  Primary Diagnosis:  Left above-knee amputee [facility])  Hospital Problems: Principal Problem:   Left above-knee amputee [facility]) Active Problems:   Pressure injury of skin    Expected Discharge Date: Expected Discharge Date: 08/20/21  Team Members Present: Physician leading conference: Dr. Leeroy Cha Social Worker Present: Erlene Quan, BSW Nurse Present: Dorthula Nettles, RN PT Present: Becky Sax, PT OT Present: Jennefer Bravo, OT PPS Coordinator present : Gunnar Fusi, SLP     Current Status/Progress Goal Weekly Team Focus  Bowel/Bladder   Pt is continent of bowel/bladder  Pt will remain continent of bowel/bladder  Will assess qshift and PRN   Swallow/Nutrition/ Hydration             ADL's   Supervision bathing, Set up A UB dressing, Min A LB dressing, min A toileting, CGA-supvision squat pivot toilet transfers  supervision, mod I  functional endurance, unilateral UE support dynamic balance, d/c planning, HEP   Mobility   bed mobility supervision, squat<>pivot and sit<>stand transfers with/without standard walker CGA, pt has no interest in ambulation/"hopping"  supervision, mod I WC mobility  functional mobility/transfers, generalized strengthening and endurance, dynamic standing balance/coordination, amputee education, D/C planning, and pain management.   Communication             Safety/Cognition/ Behavioral Observations            Pain   Pt's pain is 5/10  Pt's pain will become 0/10  Will assess qshift and PRN   Skin   Pt's incision on left AKA is healing  Pt's  incision will continue to heal  Will assess qshift and PRN     Discharge Planning:  discharging home with daughter   Team Discussion: Seizure like episode but not a seizure. Medications adjusted. Continent B/B, reports pain to left leg. Stage 2 ;left buttock, leg incision. Discharging home with daughter. All equipment ordered by family. Family has been present for education.  Patient on target to meet rehab goals: yes, supervision to mod I goals. Mod I WC mobility. Currently supervision bathing, lower body, toileting. Supervision bed mobility, squat pivot transfers. No ambulatory goals.   *See Care Plan and progress notes for long and short-term goals.   Revisions to Treatment Plan:  Adjusting medications   Teaching Needs: Family education, medication/pain management, skin/wound care, transfer training, etc.   Current Barriers to Discharge: Wound care and Weight bearing restrictions  Possible Resolutions to Barriers: Family education Follow-up therapy Order recommended DME     Medical Summary Current Status: residual limb and phantom limb pain, seizure like activity, left BKA  Barriers to Discharge: Medical stability;Wound care  Barriers to Discharge Comments: residual limb and phantom limb pain, seizure like activity, left BKA Possible Resolutions to [employer] Focus: continue Gabapentin, increased Keppra to 750mg  BID, discussed EEG results with him, continue daily wound care, increaed decadron   Continued Need for Acute Rehabilitation Level of Care: The patient requires daily medical management by a physician with specialized training in physical medicine and rehabilitation for the following reasons: Direction of a multidisciplinary physical  rehabilitation program to maximize functional independence : Yes Medical management of patient stability for increased activity during participation in an intensive rehabilitation regime.: Yes Analysis of laboratory values and/or  radiology reports with any subsequent need for medication adjustment and/or medical intervention. : Yes   I attest that I was present, lead the team conference, and concur with the assessment and plan of the team.   Cristi Loron 08/16/2021, 3:07 PM

## 2021-08-16 NOTE — Progress Notes (Signed)
PROGRESS NOTE   Subjective/Complaints: Discussed seizure-like activity yesterday and EEG results commenting on cortical dysfunction, but not seizure activity, discussed increase in decadron and Keppra Says pain is well controlled  ROS: No fever, chills, CP, SOB, HA, Abd pain, Nausea, pain is well controleld   Objective:   EEG adult  Result Date: 08/15/2021 [name], MD     08/15/2021  2:18 PM Patient Name: Roger Solis MRN: [id number] Epilepsy Attending: [name] Referring Physician/Provider: [name], LPN Date: 03/10/2692 Duration: 27.03 mins Patient history: 66 year old male with history of brain mets, seizures noted to have seizure like episode.  EEG to evaluate for seizure. Level of alertness: Awake AEDs during EEG study: LEV, GBP Technical aspects: This EEG study was done with scalp electrodes positioned according to the 10-20 International system of electrode placement. Electrical activity was acquired at a sampling rate of 500Hz  and reviewed with a high frequency filter of 70Hz  and a low frequency filter of 1Hz . EEG data were recorded continuously and digitally stored. Description: The posterior dominant rhythm consists of 8 Hz activity of moderate voltage (25-35 uV) seen predominantly in posterior head regions, symmetric and reactive to eye opening and eye closing. EEG showed intermittent sharply contoured 3 to 6 Hz 11 delta slowing admixed with 15 to 18 Hz beta activity in her right centro-parietal region consistent with breach artifact.  Hyperventilation and photic stimulation were not performed.   ABNORMALITY -Breach artifact, right centro-parietal region IMPRESSION: This study is suggestive of cortical dysfunction arising from right centro-parietal region likely secondary to underlying craniotomy. No seizures or epileptiform discharges were seen throughout the recording. [place]   CT HEAD WO  CONTRAST (5MM)  Result Date: 08/14/2021 CLINICAL DATA:  Neuro deficit, acute, stroke suspected EXAM: CT HEAD WITHOUT CONTRAST TECHNIQUE: Contiguous axial images were obtained from the base of the skull through the vertex without intravenous contrast. RADIATION DOSE REDUCTION: This exam was performed according to the departmental dose-optimization program which includes automated exposure control, adjustment of the mA and/or kV according to patient size and/or use of iterative reconstruction technique. COMPARISON:  CT head June 17, 2021.  MRI head Jul 14, 2021. FINDINGS: Brain: Relative to CT head from April 15, mild increase in left frontal vasogenic edema with similar right frontal vasogenic edema and 7 mm focus of hyperdensity. Similar overlying dural calcifications. No evidence of acute large vascular territory infarct, midline shift, hydrocephalus or new/interval acute hemorrhage. Vascular: No hyperdense vessel identified. Skull: Right pterional craniotomy.  No acute fracture. Sinuses/Orbits: Scattered paranasal sinus mucosal thickening. No acute orbital findings. Other: No mastoid effusions. IMPRESSION: Relative to CT head from April 15, mild increase in left frontal vasogenic edema with similar right frontal vasogenic edema and 7 mm focus of hyperdensity. Findings were better characterized on recent MRI head from May 12. Electronically Signed   By: Margaretha Sheffield M.D.   On: 08/14/2021 14:51   No results for input(s): "WBC", "HGB", "HCT", "PLT" in the last 72 hours.  Recent Labs    08/14/21 1509  NA 136  K 3.9  CL 100  CO2 27  GLUCOSE 104*  BUN 15  CREATININE 0.79  CALCIUM  9.2     Intake/Output Summary (Last 24 hours) at 08/16/2021 1136 Last data filed at 08/16/2021 0724 Gross per 24 hour  Intake 597 ml  Output 2650 ml  Net -2053 ml     Pressure Injury 08/09/21 Buttocks Left Stage 2 -  Partial thickness loss of dermis presenting as a shallow open injury with a red, pink wound bed  without slough. Top wound (Active)  08/09/21 1830  Location: Buttocks  Location Orientation: Left  Staging: Stage 2 -  Partial thickness loss of dermis presenting as a shallow open injury with a red, pink wound bed without slough.  Wound Description (Comments): Top wound  Present on Admission: Yes     Pressure Injury 08/09/21 Buttocks Left Stage 2 -  Partial thickness loss of dermis presenting as a shallow open injury with a red, pink wound bed without slough. Middle wound (Active)  08/09/21 1830  Location: Buttocks  Location Orientation: Left  Staging: Stage 2 -  Partial thickness loss of dermis presenting as a shallow open injury with a red, pink wound bed without slough.  Wound Description (Comments): Middle wound  Present on Admission: Yes     Pressure Injury 08/09/21 Buttocks Left Stage 2 -  Partial thickness loss of dermis presenting as a shallow open injury with a red, pink wound bed without slough. Bottom wound (Active)  08/09/21 1830  Location: Buttocks  Location Orientation: Left  Staging: Stage 2 -  Partial thickness loss of dermis presenting as a shallow open injury with a red, pink wound bed without slough.  Wound Description (Comments): Bottom wound  Present on Admission: Yes    Physical Exam: Vital Signs Blood pressure 137/67, pulse 76, temperature 98.3 F (36.8 C), resp. rate 15, height 6' (1.829 m), weight 74 kg, SpO2 95 %.  Constitutional: No distress . Vital signs reviewed. Working with therapy, BMI 22.13 HEENT: NCAT, conjugate gaze, oral membranes moist Neck: supple Cardiovascular: RRR without murmur.  Respiratory/Chest: CTA Bilaterally without wheezes or rales. Normal effort    GI/Abdomen: BS +, non-tender, non-distended Ext: no clubbing, cyanosis, or edema Psych: pleasant and cooperative  Skin: right aka cdi, abrasion left knee. Warm and dry Neuro:  Alert and oriented x3, CN 2-12 intact other than mild facial weakness, follows commands, speech and language  appears to be intact, mild L facial weakness noted(chronic), BL UE strength 5/5, RLE 4+/5 Hip flex, knee extension 5/5, Ankle PF and DF 4+/5, not able to lift L residual limb, sensation decreased in stocking glove distribution RLE, mild tremor Musculoskeletal:  R AKA  remains sl tender to palpation around incision, decreased swelling, good shape   Assessment/Plan: 1. Functional deficits which require 3+ hours per day of interdisciplinary therapy in a comprehensive inpatient rehab setting. Physiatrist is providing close team supervision and 24 hour management of active medical problems listed below. Physiatrist and rehab team continue to assess barriers to discharge/monitor patient progress toward functional and medical goals  Care Tool:  Bathing    Body parts bathed by patient: Right arm, Left arm, Chest, Abdomen, Right upper leg, Right lower leg, Left upper leg, Face, Front perineal area, Buttocks   Body parts bathed by helper: Right arm, Left arm, Chest, Abdomen, Front perineal area, Buttocks, Right upper leg, Left upper leg, Right lower leg, Left lower leg Body parts n/a: Left lower leg   Bathing assist Assist Level: Supervision/Verbal cueing     Upper Body Dressing/Undressing Upper body dressing   What is the patient wearing?: Pull over  shirt    Upper body assist Assist Level: Independent    Lower Body Dressing/Undressing Lower body dressing      What is the patient wearing?: Ace wrap/stump shrinker     Lower body assist Assist for lower body dressing: Minimal Assistance - Patient > 75%     Toileting Toileting Toileting Activity did not occur Landscape architect and hygiene only): N/A (no void or bm) (patient reports using "pee jug" prior to this session)  Toileting assist Assist for toileting: Contact Guard/Touching assist     Transfers Chair/bed transfer  Transfers assist     Chair/bed transfer assist level: Supervision/Verbal cueing      Locomotion Ambulation   Ambulation assist   Ambulation activity did not occur: Refused  Assist level: Set up assist       Walk 10 feet activity   Assist  Walk 10 feet activity did not occur: Refused  Assist level: Set up assist     Walk 50 feet activity   Assist Walk 50 feet with 2 turns activity did not occur: Refused  Assist level: Set up assist      Walk 150 feet activity   Assist Walk 150 feet activity did not occur: Refused         Walk 10 feet on uneven surface  activity   Assist Walk 10 feet on uneven surfaces activity did not occur: Refused         Wheelchair     Assist Is the patient using a wheelchair?: Yes Type of Wheelchair: Manual    Wheelchair assist level: Supervision/Verbal cueing Max wheelchair distance: 128ft    Wheelchair 50 feet with 2 turns activity    Assist        Assist Level: Supervision/Verbal cueing   Wheelchair 150 feet activity     Assist      Assist Level: Supervision/Verbal cueing   Blood pressure 137/67, pulse 76, temperature 98.3 F (36.8 C), resp. rate 15, height 6' (1.829 m), weight 74 kg, SpO2 95 %.  Medical Problem List and Plan: 1. 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  Antithrombotics: -DVT/anticoagulation:  Pharmaceutical: Heparin             -antiplatelet therapy: N/A 3. Phantom limb pain:   hydrocodone as needed  -6/10 adjusted gabapentin to 300mg  bid  -continue massage/tactile feedback to stump, visual therapy to help with pain, discussed mirror therapy 4. Mood: Emotional support             -antipsychotic agents: N/A 5.  Neuropsych: This patient is capable of making decisions on his own behalf. 6. Skin/Wound Care: Routine skin check 7. Fluids/Electrolytes/Nutrition: Routine in and outs with follow-up chemistries 8.  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  Status post stereotactic brain biopsy 05/23/2021.  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 Follow-up with Dr. Cecil Cobbs.  Decadron therapy as indicated, this is being tapered -6/13 CT head yesterday with  mild increase left frontal vasogenic edema,  low dose decadron resumed  9.  Seizure prophylaxis.  Keppra dose increased to 750mg  BID, EEG ordered for seizure like activity and shows cortical dysfunctin 10.  Hyperlipidemia.  Lipitor 11.  Tobacco abuse.  NicoDerm patch.  Counseling 12.  CAD/CABG.  No chest pain or shortness of breath 13. Hx of hypertension. He has renal artery narrowing. No home medications. Continue to monitor trend -Will add low dose amlodipine 2.5mg  14. History of CVA 15. Constipation             -on senokot,   miralax, prn dulcolax supp  6/11 multiple bm's yesterday after sorbitol + SSE    6/12 improved after BM yesterday, continue to follow 16. Leukocytosis  8/9 improved down to 10.8 yesterday 17. Severe vitamin D deficiency level 14: started ergocalciferol 50,000U once per week for 7 weeks.  18. Acute blood loss anemia, mild  -HGB stable at 11.5 19. Hypoalbuminemia albumin 2.2  -6/12 appears he is eating most meals, continue to monitor  -6/13 Improved to 2.8, continue to monitor 20. Post-craniotomy edema: decadron increased    LOS: 7 days A FACE TO FACE EVALUATION WAS PERFORMED  Clide Deutscher Gretel Cantu 08/16/2021, 11:36 AM

## 2021-08-16 NOTE — Progress Notes (Signed)
Physical Therapy Session Note  Patient Details  Name: Roger Solis MRN: [id number] Date of Birth: 08/02/1955  Today's Date: 08/16/2021 PT Individual Time: 2992-4268 and 1300-1355  PT Individual Time Calculation (min): 56 min and 55 min  Short Term Goals: Week 1:  PT Short Term Goal 1 (Week 1): STG=LTG due to LOS  Skilled Therapeutic Interventions/Progress Updates:   Treatment Session 1 Received pt semi-reclined in bed, [name], and reported pain 8/10 in L hip - RN notified and present to administer pain medication. Session with emphasis on functional mobility/transfers, generalized strengthening and endurance, and dynamic standing balance/coordination. Pt transferred semi-reclined<>sitting EOB with HOB elevated and use of bedrails with supervision/mod I. Pt transferred bed<>WC stand/squat<>pivot with close supervision and donned R legrest with supervision. Pt performed WC mobility 136ft x 2 trials using BUE and supervision to/from dayroom and able to set up transfers with supervision throughout session. Pt transferred on/off mat squat<>pivot with supervision and transferred sit<>prone with supervision. Pt performed the following exercises with supervision and verbal cues for technique with emphasis on hip flexor stretching and LE strength/ROM: -prone R hamstring curls with 1.5lb ankle weight 2x15 -prone L hip extension 2x10 - limited ROM due to pain -prone<>prone on extended elbows x10 with 5 second hold at end range -supine SLR 2x10 bilaterally -single leg bridge with RLE on physioball 2x10 Pt transferred supine<>sitting EOM with supervision and stood with standard walker and CGA and worked on dynamic standing balance playing connect four x 3 trials with CGA/light min A for balance. Returned to room and pt requested to return to bed. WC<>bed squat<>pivot with supervision. Concluded session with pt sitting EOB, needs within reach, and bed alarm on.   Treatment Session  2 Received pt semi-reclined in bed, [name], and reported pain 6/10 in L residual limb (premedicated). Session with emphasis on functional mobility/transfers and generalized strengthening and endurance. Pt transferred semi-reclined<>sitting EOB with HOB elevated and use of bedrails with mod I and transferred bed<>WC squat<>pivot with close supervision. Pt performed WC mobility 133ft x 2 trials using BUE and supervision to/from dayroom. Pt performed seated RLE strengthening on Kinetron at 20 cm/sec for 1 minute x 4 trials with therapist providing manual counter resistance with emphasis on glute/quad strength. Pt transferred on/off mat via squat<>pivot with supervision and performed the following exercises with supervision and verbal cues for technique with emphasis on core and UE/LE strength: -seated trunk rotations with 4lb medicine ball 2x10 bilaterally -bicep curls with 12lb dowel 2x12 -tricep extensions on yoga blocks 3x8 -horizontal chest press with 5lb dowel 2x12 -overhead chest press with 6.5lb dowel 2x15 -overheat chest press with 3lb dumbbells 2x10 -single arm overhead tricep extensions with 3lb dumbbell 2x10 bilaterally  -RLE LAQ 2x15 with 3lb ankle weight  -RLE hip flexion 2x15 with 3lb ankle weight Returned to room and pt requested to return to bed. Squat<>pivot WC<>bed with supervision and concluded session with pt sitting EOB, needs within reach, and bed alarm on.   Therapy Documentation Precautions:  Precautions Precautions: Fall Restrictions Weight Bearing Restrictions: No LLE Weight Bearing: Non weight bearing  Therapy/Group: Individual Therapy Alfonse Alpers PT, DPT  08/16/2021, 7:01 AM

## 2021-08-16 NOTE — Progress Notes (Signed)
Occupational Therapy Session Note  Patient Details  Name: Roger Solis MRN: [id number] Date of Birth: Dec 02, 1955  Today's Date: 08/16/2021 OT Individual Time: 3875-6433 & 1502-1530 OT Individual Time Calculation (min): 55 min & 28 min   Short Term Goals: Week 1:  OT Short Term Goal 1 (Week 1): Patient will transfer to commode with min assist OT Short Term Goal 2 (Week 1): Patient will transfer to tub transfer bench with min asssist following instruction OT Short Term Goal 3 (Week 1): Patient will dress lower body with min assist OT Short Term Goal 4 (Week 1): Patient will complete toilet hygiene and clothing management with min assist  Skilled Therapeutic Interventions/Progress Updates:  Session 1 Skilled OT intervention completed with focus on shrinker donning/residual limb skin care education, d/c planning and dynamic standing tolerance. Pt received upright in bed, with family present, no c/o pain. Discussed d/c plans at start of session including DME and no follow up OT for mod I level reasons. Completed bed mobility with mod I then squat pivot from EOB to w/c with supervision. Therapist noted pt's shrinker still being donned from previous day, with education provided about cleaning methods, and importance of skin inspection/changing of shrinker. Pt doffed shrinker with mod I, then washed it at the sink with mod I. With education provided about technique to pt and family, pt donned clean shrinker with min A. Self-propelled in w/c > day room with mod I. At high low table, pt participated in clip activity and card sorting activity with numbers/colors and higher level strategies, with alternating UE for dynamic balance, while using RW and supervision A for balance. MD at pt side to discuss seizure activity yesterday and EEG results. Transported pt dependently back to room 2/2 time, then squat pivot with supervision to EOB. Pt was left upright in bed, with bed alarm on and all needs in reach at end  of session.   Session 2 Skilled OT intervention completed with focus on BUE exercises to promote strength/endurance needed for functional transfers and ADL tasks. Pt received upright in bed 8/10 pain in buttock region from nursing managing pressure sores prior to session, with nurse aware and pt pre-medicated. Pt declining OOB therapy this session 2/2 pain with pt offering bed level exercises for minimizing pain. Upright in bed pt completed the following:   (With red theraband) 10 reps Horizontal abduction Self-anchored shoulder flexion each arm Self-anchored bicep flexion each arm Therapist-anchored scapular retraction each arm Shoulder external rotation   Cues needed for form and technique throughout. Issued pt an HEP for BUE strengthening upon home. Pt was left upright in bed, with bed alarm on and all needs in reach at end of session.  Therapy Documentation Precautions:  Precautions Precautions: Fall Restrictions Weight Bearing Restrictions: No LLE Weight Bearing: Non weight bearing    Therapy/Group: Individual Therapy  Blase Mess, MS, OTR/L  08/16/2021, 7:35 AM

## 2021-08-16 NOTE — Progress Notes (Signed)
Patient ID: Roger Solis, male   DOB: [birth year], 66 y.o.   MRN: [id number]   Patient orders sent to [facility] [place].

## 2021-08-17 MED ORDER — MAGNESIUM GLUCONATE 500 MG PO TABS
250.0000 mg | ORAL_TABLET | Freq: Every day | ORAL | Status: DC
  Administered 2021-08-17 – 2021-08-19 (×3): 250 mg via ORAL
  Filled 2021-08-17 (×3): qty 1

## 2021-08-17 NOTE — Progress Notes (Signed)
Patient ID: Roger Solis, male   DOB: 1955/04/06, 66 y.o.   MRN: [id number]  SW received notification that patient DME will be delivered tomorrow to patient home from the [place].

## 2021-08-17 NOTE — Progress Notes (Addendum)
Occupational Therapy Session Note  Patient Details  Name: Roger Solis MRN: [id number] Date of Birth: [birth year]  Today's Date: 08/17/2021 OT Individual Time: 1002-1055 OT Individual Time Calculation (min): 53 min    Short Term Goals: Week 1:  OT Short Term Goal 1 (Week 1): Patient will transfer to commode with min assist OT Short Term Goal 2 (Week 1): Patient will transfer to tub transfer bench with min asssist following instruction OT Short Term Goal 3 (Week 1): Patient will dress lower body with min assist OT Short Term Goal 4 (Week 1): Patient will complete toilet hygiene and clothing management with min assist  Skilled Therapeutic Interventions/Progress Updates:    Patient received semi seated at edge of bed with ice pack on his left hip reporting pain afer exercise this morning.  Skilled OT intervention with emphasis on improving independence with ADL.  Patient transferred to tub transfer bench in ADL apartment with supervision - patient able to set up/ break down chair without cueing.  Discussed set up in daughter's home - lateral leaning for clothing, setting up walker to drape clothes and towels.  Recommend daughter be present initally when showering to set routine at home.  Also discussed optimal use of BSC in home setting. Patient's bathroom has toilet which he would need to approach straight on.  Patient prefers squat pivot transfer from wheelchair. Discussed benefit of either stand step with standard walker, or BSC used in bedroom as safest option as he does not have adequate space for lateral transfer in bathroom.  Patient demonstrated understanding.  Patient shows very good safety awareness.   Returned to room and worked to improve UE strengthening and balance.  Exercise with 2 lb weighted bar to address shoulder flexion overhead, chest press, reaching to floor, and rotation.     Therapy Documentation Precautions:  Precautions Precautions: Fall Precaution Comments:  seizures Restrictions Weight Bearing Restrictions: Yes LLE Weight Bearing: Non weight bearing   Pain: Pain Assessment Pain Scale: 0-10 Pain Score: 5 - left hip - applied ice     Therapy/Group: Individual Therapy  Mariah Milling 08/17/2021, 10:56 AM

## 2021-08-17 NOTE — Progress Notes (Signed)
Patient ID: Roger Solis, male   DOB: October 06, 1955, 66 y.o.   MRN: [id number]  Team Conference Report to Patient/Family  Team Conference discussion was reviewed with the patient and caregiver, including goals, any changes in plan of care and target discharge date.  Patient and caregiver express understanding and are in agreement.  The patient has a target discharge date of 08/20/21.  Sw spoke with patient daughter to provide team conference updates. Patient daughter inquiring about patient recommendations. Sw informed patient daughter of recommendations and informed her that these orders will be fulfilled by the [place]. Sw will attempt to have DME delivered by Friday.   Dyanne Iha 08/17/2021, 12:39 PM

## 2021-08-17 NOTE — Progress Notes (Signed)
Physical Therapy Session Note  Patient Details  Name: Roger Solis MRN: [id number] Date of Birth: 1955-03-14  Today's Date: 08/17/2021 PT Individual Time: 0800-0913 PT Individual Time Calculation (min): 73 min   Short Term Goals: Week 1:  PT Short Term Goal 1 (Week 1): STG=LTG due to LOS Week 2:    Week 3:     Skilled Therapeutic Interventions/Progress Updates:   PAIN - rates 7/10, states pain meds not due until after session, treatment to tolerance.  Squat pivot bed to wc w/set up and close supervision.   Wc propulsion > 134ft mod I, chair pulls to L. Squat pivot to mat w/supervision.  Therapist added 5lb wt to base of wc on L to decrease veering to L  Sit to prone w/supervision.  Therex: Prone hip extension w/R sidelying bias x 15 Sidelying hip ext + abd x 15 Prone lying x 10 min for hip flexor stretch Education provided regarding importance of maintaining hip extension ROM to ensure prosthetic success.  Pt c/o nausea, prone to sit w/supervision, emesis bag provided. No vomiting occurred.  Squat pivot to wc w/supervision.   Wc propulsion mod I to wc room for cushion change due to odor.  Provided w/roho to prevent recurrence of mildew type odor.  Sit to stand to hi lo table w/supervision.  Worked on dynamic standing balance and standing tolerance via crossbody reaching and overhead reaching via transferring horseshoes L/R and overehead to hook on top of giant checkerboard.  Performs w/cga including transfeering hands.  Repeated Sit to stand from wc to table 2 sets of 5 for quad/glut power training.  Standing hip extension x 15  Added addl 4lbs to wc for improved alignment. Wc propulsion>160ft mod I.  Wc to bed w/supervision as above. Pt left seated on edge of bed w/rails up x 3, alarm set, bed in lowest position, and needs in reach.      Therapy Documentation Precautions:  Precautions Precautions: Fall Precaution Comments: seizures Restrictions Weight  Bearing Restrictions: No LLE Weight Bearing: Non weight bearing    Therapy/Group: Individual Therapy  Jerrilyn Cairo 08/17/2021, 9:14 AM

## 2021-08-17 NOTE — Discharge Summary (Signed)
Physician Discharge Summary  Patient ID: Roger Solis MRN: [id number] DOB/AGE: 1955/12/13 66 y.o.  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  EEG to evaluate for seizure. Level of alertness: Awake AEDs during EEG study: LEV, GBP Technical aspects: This EEG study was done with scalp electrodes positioned according to the 10-20 International system of electrode placement. Electrical activity was acquired at a sampling rate of 500Hz  and reviewed with a high frequency filter of 70Hz  and a low frequency filter of 1Hz . EEG data were recorded continuously and digitally stored. Description: The posterior dominant rhythm consists of 8 Hz activity of moderate voltage (25-35 uV) seen predominantly in posterior head regions, symmetric and reactive to eye opening and eye closing. EEG showed intermittent sharply contoured 3 to 6 Hz 11 delta slowing admixed with 15 to 18 Hz beta activity in her right centro-parietal region consistent with breach artifact.  Hyperventilation and photic stimulation were not performed.   ABNORMALITY -Breach artifact, right centro-parietal region IMPRESSION: This study is suggestive of cortical dysfunction arising from right centro-parietal region likely secondary to underlying  craniotomy. No seizures or epileptiform discharges were seen throughout the recording. Roger Solis   CT HEAD WO CONTRAST (5MM)  Result Date: 08/14/2021 CLINICAL DATA:  Neuro deficit, acute, stroke suspected EXAM: CT HEAD WITHOUT CONTRAST TECHNIQUE: Contiguous axial images were obtained from the base of the skull through the vertex without intravenous contrast. RADIATION DOSE REDUCTION: This exam was performed according to the departmental dose-optimization program which includes automated exposure control, adjustment of the mA and/or kV according to patient size and/or use of iterative reconstruction technique. COMPARISON:  CT head June 17, 2021.  MRI head Jul 14, 2021. FINDINGS: Brain: Relative to CT head from April 15, mild increase in left frontal vasogenic edema with similar right frontal vasogenic edema and 7 mm focus of hyperdensity. Similar overlying dural calcifications. No evidence of acute large vascular territory infarct, midline shift, hydrocephalus or new/interval acute hemorrhage. Vascular: No hyperdense vessel identified. Skull: Right pterional craniotomy.  No acute fracture. Sinuses/Orbits: Scattered paranasal sinus mucosal thickening. No acute orbital findings. Other: No mastoid effusions. IMPRESSION: Relative to CT head from April 15, mild increase in left frontal vasogenic edema with similar right frontal vasogenic edema and 7 mm focus of hyperdensity. Findings were better characterized on recent MRI head from May 12. Electronically Signed   By: Roger Solis M.D.   On: 08/14/2021 14:51    Labs:  Basic Metabolic Panel: Recent Labs  Lab 08/14/21 1509  NA 136  K 3.9  CL 100  CO2 27  GLUCOSE 104*  BUN 15  CREATININE 0.79  CALCIUM 9.2    CBC: No results for input(s): "WBC", "NEUTROABS", "HGB", "HCT", "MCV", "PLT" in the last 168 hours.  CBG: No results for input(s): "GLUCAP" in the last 168 hours.  Family history.  Mother with CAD.  Denies any colon cancer  esophageal cancer or rectal cancer  Brief HPI:   Roger Solis is a 66 y.o. 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 Ellene Solis followed by Dr. 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  He was noted to have left-sided weakness that patient reports had improved with dexamethasone.  Per chart review lives with his daughter.  Daughter does assist with some ADLs.  Presented 08/05/2021 after recent angiogram showed severe multilevel arterial occlusive disease of the left lower extremity as well as gangrenous changes.  Limb was not felt to be salvageable.  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 Deitra Solis.  Hospital course pain management.  Subcutaneous heparin added for DVT prophylaxis.  Therapy evaluations completed due to patient's decreased functional mobility was admitted for a comprehensive rehab program.   Hospital Course: Roger Solis was admitted to rehab 08/09/2021 for inpatient therapies to consist of PT, ST and OT at least three hours five days a week. Past admission physiatrist, therapy team and rehab RN have worked together to provide customized collaborative inpatient rehab.  Pertaining to patient's left AKA 08/06/2021 followed by vascular surgery.  Pain managed with use of gabapentin adjusted as needed as well as hydrocodone for breakthrough pain.  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 Cecil Solis.  Decadron therapy as indicated.  CT of the head 08/15/2021 showed some mild increase left frontal vasogenic  edema and would continue with low-dose Decadron till follow-up with medical oncology.  Keppra for seizure prophylaxis with noted history of seizure Keppra adjusted to 750 mg twice daily EEG showed no signs of seizure.  Lipitor for hyperlipidemia.  History of tobacco abuse receiving counts regards to cessation of nicotine products.  He did have a history of CAD with CABG no chest pain or shortness of breath.  Blood pressure soft and monitored and would follow-up outpatient.  Bouts of constipation resolved with laxative assistance.   Blood pressures were monitored on TID basis and soft and monitored     Rehab course: During patient's stay in rehab weekly team conferences were held to monitor patient's progress, set goals and discuss barriers to discharge. At admission, patient required minimal assist step pivot transfers minimal guard sit to supine  Physical exam.  Blood pressure 132/92 pulse 88 temperature 98.1 respirations 13 oxygen saturations 94% room air Constitutional.  No acute distress HEENT.  Right cranial incision well-healed Eyes.  Pupils round and reactive to light no discharge without nystagmus Neck.  Supple nontender no JVD without thyromegaly Cardiac regular rate and rhythm without any extra sounds or murmur heard Abdomen.  Soft nontender positive bowel sounds without rebound Respiratory effort normal no respiratory distress without wheeze Skin.  Left AKA site dressed appropriately tender Neuro.  Alert mood somewhat flat but appropriate.  Mild facial weakness.  Bilateral upper extremities 5/5 right lower extremity 4+/5 hip flexors.  He/She  has had improvement in activity tolerance, balance, postural control as well as ability to compensate for deficits. He/She has had improvement in functional use RUE/LUE  and RLE/LLE as well as improvement in awareness.  Sessions focused on functional mobility transfers generalized strengthening.  Patient transferred semireclined sitting edge of  bed head of bed elevated and use of bed rails with supervision modified independent.  Wheelchair mobility 125 feet x 2 using bilateral upper extremities and supervision.  Patient transferred on and off mat squat pivot with supervision and transferred sit to prone with supervision.  Completed bed mobility modified independent and squat pivot from edge of bed to wheelchair with supervision.  Full family teaching completed plan discharge to home       Disposition: Discharge to home    Diet: Regular  Special Instructions: No driving smoking or alcohol  Medications at discharge 1.  Tylenol as needed 2.  Norvasc 2.5 mg p.o. daily 3.  Lipitor 80 mg p.o. daily 4.  Decadron 1 mg p.o. daily 5.  Colace 100 mg p.o. daily 6.  Neurontin 300 mg p.o. 3 times daily 7.  Hydrocodone 1 to 2 tablets every 4 hours as needed pain 8.  Keppra 750 mg p.o. twice daily 9.  NicoDerm patch taper as directed 10.  Protonix 40 mg p.o. daily 11.  MiraLAX daily hold for loose stools 12.  Vitamin D 50,000 units every 7 days 13.  Albuterol inhaler 1 puff every 6 hours as needed 14.  Magnesium gluconate 250 mg daily  30-35 minutes were spent completing discharge summary and discharge planning  Discharge Instructions     Ambulatory referral to Physical Medicine Rehab   Complete by: As directed    Moderate complexity follow-up 1 to 2 weeks left AKA        Follow-up Information     Raulkar, Clide Deutscher, MD Follow up.   Specialty: Physical Medicine and Rehabilitation Why: Office to call for appointment Contact information: [address] [place] [address] [phone]         Angelia Mould, MD Follow up.   Specialties: Vascular Surgery, Cardiology Why: Call for appointment Contact information: [place] [address] [phone]         Ventura Sellers, MD Follow up.   Specialties: Psychiatry, Neurology, Oncology Why: Call for appointment Contact  information: [place] [address] [phone]                 Signed: Cathlyn Parsons 08/18/2021, 5:53 AM

## 2021-08-17 NOTE — Progress Notes (Signed)
Physical Therapy Session Note  Patient Details  Name: Roger Solis MRN: [id number] Date of Birth: [birth year]  Today's Date: 08/17/2021 PT Individual Time: 1017-5102 PT Individual Time Calculation (min): 54 min   Short Term Goals: Week 1:  PT Short Term Goal 1 (Week 1): STG=LTG due to LOS  Skilled Therapeutic Interventions/Progress Updates:   Received pt semi-reclined in bed, [name], and reported pain 4/10 in L residual limb (premedicated). Session with emphasis on functional mobility/transfers, generalized strengthening and endurance, and dynamic standing balance/coordination. Pt transferred semi-reclined<>sitting EOB with HOB elevated and use of bedrails with supervision/mod I. Pt performed squat<>pivot transfers x 4 trials throughout session with close supervision - pt also able to set up transfers with supervision. Pt performed WC mobility >3103ft using BUE and supervision with increased time to ortho gym. Pt performed simulated car transfer with standard walker and CGA then performed WC mobility on ramp 1ft x 2 trials with supervision (trial 1 ascending forward using BUEs and trial 2 ascending backwards using RLE and BUEs). Pt performed WC mobility additional 123ft using BUE and supervision to main therapy gym. Worked on sit<>stands on Airex with BUE support and CGA x10 reps with emphasis on quad strength. Transitioned to tricep extensions standing with standard walker 2x5 with CGA - emphasis on clearing RLE without excessive force of "hopping". Lastly, worked on dynamic standing balance clipping/unclipping clothespins using RUE with CGA for 3 minutes while standing on Airex - to fatigue. Pt performed WC mobility 165ft using BUE and supervision back to room. Pt requested to return to bed and concluded session with pt sitting EOB, needs within reach, and bed alarm on. Provided pt with fresh drink.   Therapy Documentation Precautions:  Precautions Precautions:  Fall Precaution Comments: seizures Restrictions Weight Bearing Restrictions: No LLE Weight Bearing: Non weight bearing  Therapy/Group: Individual Therapy Alfonse Alpers PT, DPT  08/17/2021, 7:17 AM

## 2021-08-17 NOTE — Progress Notes (Signed)
PROGRESS NOTE   Subjective/Complaints: No new complaints this morning Happy with d/c date Working with Pamala Hurry Pain is stable  ROS: Denies fever, chills, CP, SOB, HA, Abd pain, Nausea, pain is well controleld   Objective:   EEG adult  Result Date: 08/15/2021 [name], MD     08/15/2021  2:18 PM Patient Name: Roger Solis MRN: [id number] Epilepsy Attending: [name] Referring Physician/Provider: [name], LPN Date: 11/25/3005 Duration: 27.03 mins Patient history: 66 year old male with history of brain mets, seizures noted to have seizure like episode.  EEG to evaluate for seizure. Level of alertness: Awake AEDs during EEG study: LEV, GBP Technical aspects: This EEG study was done with scalp electrodes positioned according to the 10-20 International system of electrode placement. Electrical activity was acquired at a sampling rate of 500Hz  and reviewed with a high frequency filter of 70Hz  and a low frequency filter of 1Hz . EEG data were recorded continuously and digitally stored. Description: The posterior dominant rhythm consists of 8 Hz activity of moderate voltage (25-35 uV) seen predominantly in posterior head regions, symmetric and reactive to eye opening and eye closing. EEG showed intermittent sharply contoured 3 to 6 Hz 11 delta slowing admixed with 15 to 18 Hz beta activity in her right centro-parietal region consistent with breach artifact.  Hyperventilation and photic stimulation were not performed.   ABNORMALITY -Breach artifact, right centro-parietal region IMPRESSION: This study is suggestive of cortical dysfunction arising from right centro-parietal region likely secondary to underlying craniotomy. No seizures or epileptiform discharges were seen throughout the recording. Priyanka Barbra Sarks   No results for input(s): "WBC", "HGB", "HCT", "PLT" in the last 72 hours.  Recent Labs    08/14/21 1509  NA 136   K 3.9  CL 100  CO2 27  GLUCOSE 104*  BUN 15  CREATININE 0.79  CALCIUM 9.2     Intake/Output Summary (Last 24 hours) at 08/17/2021 1013 Last data filed at 08/17/2021 0932 Gross per 24 hour  Intake 577 ml  Output 1500 ml  Net -923 ml     Pressure Injury 08/09/21 Buttocks Left Stage 2 -  Partial thickness loss of dermis presenting as a shallow open injury with a red, pink wound bed without slough. Top wound (Active)  08/09/21 1830  Location: Buttocks  Location Orientation: Left  Staging: Stage 2 -  Partial thickness loss of dermis presenting as a shallow open injury with a red, pink wound bed without slough.  Wound Description (Comments): Top wound  Present on Admission: Yes     Pressure Injury 08/09/21 Buttocks Left Stage 2 -  Partial thickness loss of dermis presenting as a shallow open injury with a red, pink wound bed without slough. Middle wound (Active)  08/09/21 1830  Location: Buttocks  Location Orientation: Left  Staging: Stage 2 -  Partial thickness loss of dermis presenting as a shallow open injury with a red, pink wound bed without slough.  Wound Description (Comments): Middle wound  Present on Admission: Yes     Pressure Injury 08/09/21 Buttocks Left Stage 2 -  Partial thickness loss of dermis presenting as a shallow open injury with a red, pink wound  bed without slough. Bottom wound (Active)  08/09/21 1830  Location: Buttocks  Location Orientation: Left  Staging: Stage 2 -  Partial thickness loss of dermis presenting as a shallow open injury with a red, pink wound bed without slough.  Wound Description (Comments): Bottom wound  Present on Admission: Yes     Pressure Injury 08/16/21 Buttocks Right Stage 2 -  Partial thickness loss of dermis presenting as a shallow open injury with a red, pink wound bed without slough. (Active)  08/16/21 1452  Location: Buttocks  Location Orientation: Right  Staging: Stage 2 -  Partial thickness loss of dermis presenting as a  shallow open injury with a red, pink wound bed without slough.  Wound Description (Comments):   Present on Admission: No    Physical Exam: Vital Signs Blood pressure (!) 148/72, pulse 74, temperature 98 F (36.7 C), resp. rate 15, height 6' (1.829 m), weight 74 kg, SpO2 95 %.  Constitutional: No distress . Vital signs reviewed. Working with therapy, BMI 22.13 HEENT: NCAT, conjugate gaze, oral membranes moist Neck: supple Cardiovascular: RRR without murmur.  Respiratory/Chest: CTA Bilaterally without wheezes or rales. Normal effort    GI/Abdomen: BS +, non-tender, non-distended Ext: no clubbing, cyanosis, or edema Psych: pleasant and cooperative  Skin: right aka cdi, abrasion left knee. Warm and dry Neuro:  Alert and oriented x3, CN 2-12 intact other than mild facial weakness, follows commands, speech and language appears to be intact, mild L facial weakness noted(chronic), BL UE strength 5/5, RLE 4+/5 Hip flex, knee extension 5/5, Ankle PF and DF 4+/5, not able to lift L residual limb, sensation decreased in stocking glove distribution RLE, mild tremor Musculoskeletal:  R AKA  remains sl tender to palpation around incision, decreased swelling, good shape. Functional mobility: sit to stand with S   Assessment/Plan: 1. Functional deficits which require 3+ hours per day of interdisciplinary therapy in a comprehensive inpatient rehab setting. Physiatrist is providing close team supervision and 24 hour management of active medical problems listed below. Physiatrist and rehab team continue to assess barriers to discharge/monitor patient progress toward functional and medical goals  Care Tool:  Bathing    Body parts bathed by patient: Right arm, Left arm, Chest, Abdomen, Right upper leg, Right lower leg, Left upper leg, Face, Front perineal area, Buttocks   Body parts bathed by helper: Right arm, Left arm, Chest, Abdomen, Front perineal area, Buttocks, Right upper leg, Left upper leg,  Right lower leg, Left lower leg Body parts n/a: Left lower leg   Bathing assist Assist Level: Supervision/Verbal cueing     Upper Body Dressing/Undressing Upper body dressing   What is the patient wearing?: Pull over shirt    Upper body assist Assist Level: Independent    Lower Body Dressing/Undressing Lower body dressing      What is the patient wearing?: Ace wrap/stump shrinker     Lower body assist Assist for lower body dressing: Minimal Assistance - Patient > 75%     Toileting Toileting Toileting Activity did not occur Landscape architect and hygiene only): N/A (no void or bm) (patient reports using "pee jug" prior to this session)  Toileting assist Assist for toileting: Contact Guard/Touching assist     Transfers Chair/bed transfer  Transfers assist     Chair/bed transfer assist level: Supervision/Verbal cueing     Locomotion Ambulation   Ambulation assist   Ambulation activity did not occur: Refused  Assist level: Set up assist       Walk  10 feet activity   Assist  Walk 10 feet activity did not occur: Refused  Assist level: Set up assist     Walk 50 feet activity   Assist Walk 50 feet with 2 turns activity did not occur: Refused  Assist level: Set up assist      Walk 150 feet activity   Assist Walk 150 feet activity did not occur: Refused         Walk 10 feet on uneven surface  activity   Assist Walk 10 feet on uneven surfaces activity did not occur: Refused         Wheelchair     Assist Is the patient using a wheelchair?: Yes Type of Wheelchair: Manual    Wheelchair assist level: Supervision/Verbal cueing Max wheelchair distance: 136ft    Wheelchair 50 feet with 2 turns activity    Assist        Assist Level: Supervision/Verbal cueing   Wheelchair 150 feet activity     Assist      Assist Level: Supervision/Verbal cueing   Blood pressure (!) 148/72, pulse 74, temperature 98 F (36.7 C),  resp. rate 15, height 6' (1.829 m), weight 74 kg, SpO2 95 %.  Medical Problem List and Plan: 1. Functional deficits secondary to left AKA 08/06/2021 secondary to multilevel arterial occlusive disease with ischemic gangrenous changes             -patient may shower, cover incision             -ELOS/Goals: 10-12 Mod I to Supervision             -F/U with Vascular in 1 month  - grounds pass prn  -Continue CIR therapies including PT, OT  2.  Antithrombotics: -DVT/anticoagulation:  Pharmaceutical: Heparin             -antiplatelet therapy: N/A 3. Phantom limb pain:   hydrocodone as needed  Continue gabapentin to 300mg  bid  -continue massage/tactile feedback to stump, visual therapy to help with pain, discussed mirror therapy 4. Mood: Emotional support             -antipsychotic agents: N/A 5. Neuropsych: This patient is capable of making decisions on his own behalf. 6. Skin/Wound Care: Routine skin check 7. Fluids/Electrolytes/Nutrition: Routine in and outs with follow-up chemistries 8.  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  Status post stereotactic brain biopsy 05/23/2021.  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 Follow-up with Dr. Cecil Cobbs.  Decadron therapy as indicated, this is being tapered -6/13 CT head yesterday with  mild increase left frontal vasogenic edema, low dose decadron resumed  9.  Seizure prophylaxis.  Keppra dose increased to 750mg  BID, EEG ordered for seizure like activity and shows cortical dysfunctin 10.  Hyperlipidemia.  Continue Lipitor 11.  Tobacco abuse.  NicoDerm patch.  Counseling 12.  CAD/CABG.  No chest pain or shortness of breath 13. Hx of hypertension. He has renal artery narrowing. No home medications. Continue to monitor trend -Will add low dose amlodipine 2.5mg  14. History of CVA 15. Constipation             -on senokot,   miralax, prn dulcolax supp. Add magnesium gluconate HS 16. Leukocytosis  8/9  improved down to 10.8 yesterday 17. Severe vitamin D deficiency level 14: started ergocalciferol 50,000U once per week for 7 weeks.  18. Acute blood loss anemia, mild  -HGB stable at 11.5 19. Hypoalbuminemia albumin 2.2  -6/12  appears he is eating most meals, continue to monitor  -6/13 Improved to 2.8, continue to monitor 20. Post-craniotomy edema: decadron increased    LOS: 8 days A FACE TO FACE EVALUATION WAS PERFORMED  Martha Clan P Allora Bains 08/17/2021, 10:13 AM

## 2021-08-17 NOTE — Progress Notes (Signed)
Patient ID: Roger Solis, male   DOB: 1955-07-28, 66 y.o.   MRN: [id number]  Patient [facility] referral sent to [facility]

## 2021-08-18 MED ORDER — ACETAMINOPHEN 325 MG PO TABS: 325.0000 mg | ORAL_TABLET | Freq: Four times a day (QID) | ORAL | Status: DC | PRN

## 2021-08-18 MED ORDER — NICOTINE 21 MG/24HR TD PT24
MEDICATED_PATCH | TRANSDERMAL | 0 refills | Status: DC
Start: 2021-08-18 — End: 2021-09-15

## 2021-08-18 MED ORDER — HYDROCODONE-ACETAMINOPHEN 5-325 MG PO TABS: 1.0000 | ORAL_TABLET | ORAL | 0 refills | Status: DC | PRN

## 2021-08-18 MED ORDER — DOCUSATE SODIUM 100 MG PO CAPS
100.0000 mg | ORAL_CAPSULE | Freq: Every day | ORAL | 0 refills | Status: DC
Start: 2021-08-18 — End: 2023-04-26

## 2021-08-18 MED ORDER — ALBUTEROL SULFATE HFA 108 (90 BASE) MCG/ACT IN AERS: 1.0000 | INHALATION_SPRAY | Freq: Four times a day (QID) | RESPIRATORY_TRACT | 0 refills | Status: DC | PRN

## 2021-08-18 MED ORDER — DEXAMETHASONE 1 MG PO TABS: 1.0000 mg | ORAL_TABLET | Freq: Every day | ORAL | 0 refills | Status: DC

## 2021-08-18 MED ORDER — LEVETIRACETAM 750 MG PO TABS
750.0000 mg | ORAL_TABLET | Freq: Two times a day (BID) | ORAL | 0 refills | Status: DC
Start: 2021-08-18 — End: 2021-09-01

## 2021-08-18 MED ORDER — VITAMIN D (ERGOCALCIFEROL) 1.25 MG (50000 UNIT) PO CAPS: 50000.0000 [IU] | ORAL_CAPSULE | ORAL | 0 refills | Status: DC

## 2021-08-18 MED ORDER — GABAPENTIN 300 MG PO CAPS: 300.0000 mg | ORAL_CAPSULE | Freq: Three times a day (TID) | ORAL | 0 refills | Status: DC

## 2021-08-18 MED ORDER — PANTOPRAZOLE SODIUM 40 MG PO TBEC: 40.0000 mg | DELAYED_RELEASE_TABLET | Freq: Every day | ORAL | 0 refills | Status: DC

## 2021-08-18 MED ORDER — AMLODIPINE BESYLATE 2.5 MG PO TABS: 2.5000 mg | ORAL_TABLET | Freq: Every day | ORAL | 0 refills | Status: DC

## 2021-08-18 MED ORDER — AMLODIPINE BESYLATE 5 MG PO TABS
5.0000 mg | ORAL_TABLET | Freq: Every day | ORAL | Status: DC
  Administered 2021-08-19 – 2021-08-20 (×2): 5 mg via ORAL
  Filled 2021-08-18 (×2): qty 1

## 2021-08-18 MED ORDER — ATORVASTATIN CALCIUM 80 MG PO TABS
80.0000 mg | ORAL_TABLET | Freq: Every day | ORAL | 0 refills | Status: DC
Start: 2021-08-18 — End: 2021-09-15

## 2021-08-18 MED ORDER — MAGNESIUM GLUCONATE 500 MG PO TABS: 250.0000 mg | ORAL_TABLET | Freq: Every day | ORAL | 0 refills | Status: DC

## 2021-08-18 MED ORDER — POLYETHYLENE GLYCOL 3350 17 G PO PACK: 17.0000 g | PACK | Freq: Every day | ORAL | 0 refills | Status: DC

## 2021-08-18 NOTE — Progress Notes (Signed)
Inpatient Rehabilitation Discharge Medication Review by a Pharmacist  A complete drug regimen review was completed for this patient to identify any potential clinically significant medication issues.  High Risk Drug Classes Is patient taking? Indication by Medication  Antipsychotic No   Anticoagulant No   Antibiotic No   Opioid Yes Norco: PRN pain  Antiplatelet No   Hypoglycemics/insulin No   Vasoactive Medication Yes Norvasc- hypertension  Chemotherapy No   Other Yes Atorvastatin: HLD Dexamethasone: steroid Docusate, miralax: constipation Gabapentin: neuropathy/pain Levetiracetam: seizure ppx Nicotine patch: tobacco cessation  Pantoprazole: GERD ppx Ergocalciferol- vitamin D deficiency Magnesium- supplementation Albuterol- sob     Type of Medication Issue Identified Description of Issue Recommendation(s)  Drug Interaction(s) (clinically significant)     Duplicate Therapy     Allergy     No Medication Administration End Date     Incorrect Dose     Additional Drug Therapy Needed     Significant med changes from prior encounter (inform family/care partners about these prior to discharge).    Other       Clinically significant medication issues were identified that warrant physician communication and completion of prescribed/recommended actions by midnight of the next day:  No   Time spent performing this drug regimen review (minutes): 30   Thank you for allowing pharmacy to be a part of this patient's care.  Vaughan Basta BS, PharmD, BCPS Clinical Pharmacist 08/18/2021 2:55 PM  Contact: [phone] after 3 PM  "Be curious, not judgmental..." -Jamal Maes

## 2021-08-18 NOTE — Progress Notes (Signed)
Occupational Therapy Session Note  Patient Details  Name: Roger Solis MRN: [id number] Date of Birth: [birth year]  Today's Date: 08/18/2021 OT Individual Time: 1638-4536 OT Individual Time Calculation (min): 45 min    Short Term Goals: Week 1:  OT Short Term Goal 1 (Week 1): Patient will transfer to commode with min assist OT Short Term Goal 2 (Week 1): Patient will transfer to tub transfer bench with min asssist following instruction OT Short Term Goal 3 (Week 1): Patient will dress lower body with min assist OT Short Term Goal 4 (Week 1): Patient will complete toilet hygiene and clothing management with min assist  Skilled Therapeutic Interventions/Progress Updates:    S: Patient and Daughter in room upon therapy arrival. Patient requesting to take a shower.    Patient participated in skilled OT session focusing on improving overall independence, safety, and judgement with self care tasks while taking a shower seated on shower chair. Pt was able to manage and maneuver w/c in order to transfer with proper technique from bed to w/c and then to shower chair and off once finished. Bathing was completed seated utilizing lateral leans right and left to reach all areas. No supervision was required during bathing and pt completed at Mod I level. Once finished in shower, patient transferred back to bed to complete upper and lower body dressing. Pt was provided set-up of clothes and completed while demonstrating good safety awareness and utilizing compensatory techniques.       Therapy Documentation Precautions:  Precautions Precautions: Fall Precaution Comments: seizure-like activity Restrictions Weight Bearing Restrictions: Yes LLE Weight Bearing: Non weight bearing  Pain: 0/10 pain reported   Therapy/Group: Individual Therapy   Ailene Ravel, OTR/L,CBIS  Supplemental OT - [facility]  08/18/2021, 7:53 AM

## 2021-08-18 NOTE — Progress Notes (Signed)
Physical Therapy Discharge Summary  Patient Details  Name: Roger Solis MRN: [id number] Date of Birth: 11/27/1955  Patient has met {NUMBERS 0-12:18577} of 7 long term goals due to improved activity tolerance, improved balance, improved postural control, increased strength, increased range of motion, decreased pain, ability to compensate for deficits, improved awareness, and improved coordination.  Patient to discharge at a wheelchair level Modified Independent. Patient's care partner is independent to provide the necessary physical assistance at discharge.  Reasons goals not met: ***  Recommendation:  Patient will benefit from ongoing skilled PT services in home health setting to continue to advance safe functional mobility, address ongoing impairments in transfers, generalized strengthening and endurance, dynamic standing balance/coordination, amputee education, and to minimize fall risk.  Equipment: 16x16 manual WC with standard legrests, already has standard walker  Reasons for discharge: treatment goals met  Patient/family agrees with progress made and goals achieved: Yes  PT Discharge Precautions/Restrictions Precautions Precautions: Fall Precaution Comments: seizure-like activity Restrictions Weight Bearing Restrictions: Yes LLE Weight Bearing: Non weight bearing Pain Interference Pain Interference Pain Effect on Sleep: 2. Occasionally Pain Interference with Therapy Activities: 2. Occasionally Pain Interference with Day-to-Day Activities: 2. Occasionally Cognition Overall Cognitive Status: Within Functional Limits for tasks assessed Arousal/Alertness: Awake/alert Orientation Level: Oriented X4 Memory: Appears intact Awareness: Appears intact Problem Solving: Appears intact Safety/Judgment: Appears intact Comments: pt is slightly HOH Sensation   Motor  Motor Motor: Abnormal postural alignment and control Motor - Skilled Clinical Observations: altered balance  strategies 2/2 L AKA and generalized weakness/deconditioning  Mobility Bed Mobility Bed Mobility: Rolling Right;Rolling Left;Sit to Supine;Supine to Sit Rolling Right: Independent with assistive device Rolling Left: Independent with assistive device Supine to Sit: Independent with assistive device Sit to Supine: Independent with assistive device Transfers Transfers: Sit to Stand;Stand to Sit;Stand Pivot Transfers;Squat Pivot Transfers Sit to Stand: Supervision/Verbal cueing Stand to Sit: Supervision/Verbal cueing Stand Pivot Transfers: Supervision/Verbal cueing Stand Pivot Transfer Details: Verbal cues for safe use of DME/AE Stand Pivot Transfer Details (indicate cue type and reason): occasional cues for walker safety Squat Pivot Transfers: Independent with assistive device Transfer (Assistive device): Standard walker Locomotion  Gait Ambulation: No Gait Gait: No Stairs / Additional Locomotion Stairs: No Wheelchair Mobility Wheelchair Mobility: Yes Wheelchair Assistance: Independent with Camera operator: Both upper extremities Wheelchair Parts Management: Independent Distance: >11f  Trunk/Postural Assessment  Cervical Assessment Cervical Assessment: Within Functional Limits Thoracic Assessment Thoracic Assessment: [facility]) Lumbar Assessment Lumbar Assessment: Ex[facility]) Postural Control Postural Control: Within Functional Limits  Balance Balance Balance Assessed: Yes Static Sitting Balance Static Sitting - Balance Support: Feet supported;Bilateral upper extremity supported Static Sitting - Level of Assistance: 7: Independent Dynamic Sitting Balance Dynamic Sitting - Balance Support: Feet supported;No upper extremity supported Dynamic Sitting - Level of Assistance: 6: Modified independent (Device/Increase time) Static Standing Balance Static Standing - Balance Support: Bilateral upper  extremity supported (standard walker) Static Standing - Level of Assistance: 5: Stand by assistance (supervision) Dynamic Standing Balance Dynamic Standing - Balance Support: Bilateral upper extremity supported (standard walker) Dynamic Standing - Level of Assistance: 5: Stand by assistance (close supervision) Dynamic Standing - Comments: with transfers Extremity Assessment       AAlfonse AlpersPT, DPT  08/18/2021, 7:01 AM

## 2021-08-18 NOTE — Progress Notes (Signed)
Physical Therapy Session Note  Patient Details  Name: Roger Solis MRN: [id number] Date of Birth: [birth year]  Today's Date: 08/18/2021 PT Individual Time: 8315-1761 and 6073-7106 PT Individual Time Calculation (min): 68 min and 58 min  Short Term Goals: Week 1:  PT Short Term Goal 1 (Week 1): STG=LTG due to LOS  Skilled Therapeutic Interventions/Progress Updates:   Treatment Session 1 Received pt semi-reclined in bed with daughter present at bedside. [name] and reported pain 4/10 in L residual limb (premedicated). Session with emphasis on functional mobility/transfers, generalized strengthening and endurance, and WC mobility. Pt transferred semi-reclined<>sitting EOB with HOB elevated and supervision. Pt performed x 8 squat<>pivot transfers with supervision throughout session and able to set up transfers with supervision/mod I, demonstrating good safety awareness. Pt performed WC mobility >165ft using BUE and supervision/mod I to rehab apartment. Pt performed furniture transfers on/off recliner with CGA/close supervision and cues for set up/technique - therapist stabilizing recliner and encouraged having daughter stabilize recliner at home when transferring. Pt performed WC mobility additional >229ft using BUE and supervision to dayroom and transferred on/off Nustep. Pt performed BUE and RLE strengthening on Nustep at workload 5 for 10 minutes for a total of 270 steps with emphasis on cardiovascular endurance. Pt transferred sit<>prone with supervision and performed the following exercises with emphasis on hip flexor stretching and LE strength: -prone L hip extension 2x10 -prone<>prone on extended elbows 10x3 second hold Pt transferred prone<>long sitting<>sitting EOB with supervision and performed WC mobility 178ft using BUE and supervision back to room. Concluded session with pt sitting EOB with all needs within reach.   Treatment Session 2 Received pt sitting in WC in  dayroom - handoff from OT. [name] and reported pain 7/10 in L residual limb - RN notified and present to administer pain medication. Session with emphasis on functional mobility/transfers, generalized strengthening and endurance, WC mobility, and dynamic standing balance/coordination. Worked on [facility] mobility performing obstacle course consisting of weaving in/out of cones x41ft forwards and x32ft backwards with supervision with emphasis on navigating tight turns to simulate home environment. Recommended WC gloves at home to protect hands as pt also has very thin skin and is prone to skin tears. Pt transferred sit<>stand with RW and CGA/close supervision x 2 trials and worked on dynamic standing balance playing Basketball for x1 minute and x1.5 minutes with CGA/close supervision for balance - limited by fatigue in RLE from prolonged standing. Pt transferred on/off mat squat<>pivot with supervision. Pt transferred sit<>supine with supervision and performed the following exercises with supervision and verbal/visual cues for technique with emphasis on UE/LE strength: -supine chest press with 12lb dowel 2x15 -R single leg bridge 2x10 -R sidelying L hip abduction 2x12 -R sidelying L hip extension 2x12 -supine R SLR with 0.75lb ankle weight 2x12 -overhead chest press<>horizontal chest press with 7lb dowel 2x10 Pt performed WC mobility 124ft using BUE and supervision back to room and transferred WC<>bed squat<>pivot with supervision. Concluded session with pt sitting EOB, needs within reach, and bed alarm on. NT present checking vitals and provided pt with ice pack for pain relief.   Therapy Documentation Precautions:  Precautions Precautions: Fall Precaution Comments: seizures Restrictions Weight Bearing Restrictions: Yes LLE Weight Bearing: Non weight bearing  Therapy/Group: Individual Therapy Alfonse Alpers PT, DPT  08/18/2021, 6:55 AM

## 2021-08-18 NOTE — Progress Notes (Signed)
Patient ID: Roger Solis, male   DOB: 1955-10-30, 66 y.o.   MRN: [id number]  SW informed patient spouse of patient current bed offers. If patient and spouse are interested, SW will reach out to AD. SW will wait for patient and family to follow up. Sw informed spouse to review [facility] agency resource list if they are not interested in the facilities.

## 2021-08-18 NOTE — Progress Notes (Signed)
Occupational Therapy Session Note  Patient Details  Name: Roger Solis MRN: [id number] Date of Birth: 10/18/1955  Today's Date: 08/18/2021 OT Individual Time: 1300-1400 OT Individual Time Calculation (min): 60 min    Short Term Goals: Week 1:  OT Short Term Goal 1 (Week 1): Patient will transfer to commode with min assist OT Short Term Goal 2 (Week 1): Patient will transfer to tub transfer bench with min asssist following instruction OT Short Term Goal 3 (Week 1): Patient will dress lower body with min assist OT Short Term Goal 4 (Week 1): Patient will complete toilet hygiene and clothing management with min assist  Skilled Therapeutic Interventions/Progress Updates:     Upon OT arrival, pt semi recumbent in bed. Pt reports pain at a 7/10 to the R hip. Pt agreeable to OT session. Pt completes functional bed mobility with Mod I. Pt completes squat pivot transfer into w/c with Mod I. Pt donns R shoe with Setup assist. Pt propels self in w/c to therapy gym Mod I. Therapy session focused on UE strengthening, endurance, standing tolerance, and balance during functional tasks. Pt sits in w/c at tabletop to replicate 3 graphic designs using plastic pipes and connectors with B UE and 1.5lb wrist weights. Pt able to replicate all designs without requiring rest break but does report min fatigue. Pt completes 3 STS transfers with Supervision and stands at tabletop with UE support and Supervision to flip over and sort quirkle game pieces based on shape and color. Pt able to tolerate standing ~5 minutes for first 2 trials then 3 minutes for last trial. Pt unable to sort all game pieces while standing d/t fatigue to R LE therefore pt continued sorting remaining pieces seated in w/c. Pt returned all game pieces to the bag without difficulty. Pt then propelled himself to the bathroom Mod I via w/c and completes toilet transfer and simulates toileting tasks at the levels listed below. Pt was left in w/c at end of  session with PT present.    Therapy Documentation Precautions:  Precautions Precautions: Fall Precaution Comments: seizure-like activity Restrictions Weight Bearing Restrictions: Yes LLE Weight Bearing: Non weight bearing   ADL: Toileting: Modified independent (simulated d/t pt did not need to go) Where Assessed-Toileting: Glass blower/designer: Close supervision (secondary to hip pain) Toilet Transfer Method: Squat pivot Toilet Transfer Equipment: Grab bars  Therapy/Group: Individual Therapy  Marvetta Gibbons 08/18/2021, 4:35 PM

## 2021-08-18 NOTE — Progress Notes (Signed)
Inpatient Rehabilitation Care Coordinator Discharge Note   Patient Details  Name: Roger Solis MRN: [id number] Date of Birth: 11/23/1955   Discharge location: Home  Length of Stay: 11 Days  Discharge activity level: Sup.Min  Home/community participation: Daughter  Patient response [id number] Literacy - How often do you need to have someone help you when you read instructions, pamphlets, or other written material from your doctor or pharmacy?: Never  Patient response HA:[id number] Isolation - How often do you feel lonely or isolated from those around you?: Never  Services provided included: SW, Pharmacy, TR, CM, RN, SLP, OT, PT, RD, MD  Financial Services:  Financial Services Utilized: Leigh offered to/list presented to: p  Follow-up services arranged:  [place]: [facility]         Patient response to transportation need: Is the patient able to respond to transportation needs?: Yes In the past 12 months, has lack of transportation kept you from medical appointments or from getting medications?: No In the past 12 months, has lack of transportation kept you from meetings, work, or from getting things needed for daily living?: No    Comments (or additional information):  Patient/Family verbalized understanding of follow-up arrangements:  Yes  Individual responsible for coordination of the follow-up plan: [name]  Confirmed correct DME delivered: Dyanne Iha 08/18/2021    Dyanne Iha

## 2021-08-18 NOTE — Progress Notes (Signed)
PROGRESS NOTE   Subjective/Complaints: No new complaints this morning Excited for d/c on Sunday Denies further seizure-like activity Pain is stable  ROS: Denies fever, chills, CP, SOB, HA, Abd pain, Nausea, pain is well controlled, denies further seizure-like activity   Objective:   No results found. No results for input(s): "WBC", "HGB", "HCT", "PLT" in the last 72 hours.  No results for input(s): "NA", "K", "CL", "CO2", "GLUCOSE", "BUN", "CREATININE", "CALCIUM" in the last 72 hours.    Intake/Output Summary (Last 24 hours) at 08/18/2021 1051 Last data filed at 08/18/2021 0935 Gross per 24 hour  Intake 597 ml  Output 1125 ml  Net -528 ml     Pressure Injury 08/09/21 Buttocks Left Stage 2 -  Partial thickness loss of dermis presenting as a shallow open injury with a red, pink wound bed without slough. Top wound (Active)  08/09/21 1830  Location: Buttocks  Location Orientation: Left  Staging: Stage 2 -  Partial thickness loss of dermis presenting as a shallow open injury with a red, pink wound bed without slough.  Wound Description (Comments): Top wound  Present on Admission: Yes     Pressure Injury 08/09/21 Buttocks Left Stage 2 -  Partial thickness loss of dermis presenting as a shallow open injury with a red, pink wound bed without slough. Middle wound (Active)  08/09/21 1830  Location: Buttocks  Location Orientation: Left  Staging: Stage 2 -  Partial thickness loss of dermis presenting as a shallow open injury with a red, pink wound bed without slough.  Wound Description (Comments): Middle wound  Present on Admission: Yes     Pressure Injury 08/09/21 Buttocks Left Stage 2 -  Partial thickness loss of dermis presenting as a shallow open injury with a red, pink wound bed without slough. Bottom wound (Active)  08/09/21 1830  Location: Buttocks  Location Orientation: Left  Staging: Stage 2 -  Partial thickness loss  of dermis presenting as a shallow open injury with a red, pink wound bed without slough.  Wound Description (Comments): Bottom wound  Present on Admission: Yes     Pressure Injury 08/16/21 Buttocks Right Stage 2 -  Partial thickness loss of dermis presenting as a shallow open injury with a red, pink wound bed without slough. (Active)  08/16/21 1452  Location: Buttocks  Location Orientation: Right  Staging: Stage 2 -  Partial thickness loss of dermis presenting as a shallow open injury with a red, pink wound bed without slough.  Wound Description (Comments):   Present on Admission: No    Physical Exam: Vital Signs Blood pressure (!) 144/81, pulse 74, temperature 98.3 F (36.8 C), temperature source Oral, resp. rate 16, height 6' (1.829 m), weight 74 kg, SpO2 95 %.  Constitutional: No distress . Vital signs reviewed. Working with therapy, BMI 22.13 HEENT: NCAT, conjugate gaze, oral membranes moist Neck: supple Cardiovascular: RRR without murmur.  Respiratory/Chest: CTA Bilaterally without wheezes or rales. Normal effort    GI/Abdomen: BS +, non-tender, non-distended Ext: no clubbing, cyanosis, or edema Psych: pleasant and cooperative  Skin: right aka cdi, abrasion left knee. Warm and dry Neuro:  Alert and oriented x3, CN 2-12 intact other than  mild facial weakness, follows commands, speech and language appears to be intact, mild L facial weakness noted(chronic), BL UE strength 5/5, RLE 4+/5 Hip flex, knee extension 5/5, Ankle PF and DF 4+/5, not able to lift L residual limb, sensation decreased in stocking glove distribution RLE, mild tremor Musculoskeletal:  R AKA  remains sl tender to palpation around incision, decreased swelling, good shape. Functional mobility: sit to stand with S, transferring with S   Assessment/Plan: 1. Functional deficits which require 3+ hours per day of interdisciplinary therapy in a comprehensive inpatient rehab setting. Physiatrist is providing close team  supervision and 24 hour management of active medical problems listed below. Physiatrist and rehab team continue to assess barriers to discharge/monitor patient progress toward functional and medical goals  Care Tool:  Bathing    Body parts bathed by patient: Right arm, Left arm, Chest, Abdomen, Right upper leg, Right lower leg, Left upper leg, Face, Front perineal area, Buttocks   Body parts bathed by helper: Right arm, Left arm, Chest, Abdomen, Front perineal area, Buttocks, Right upper leg, Left upper leg, Right lower leg, Left lower leg Body parts n/a: Left lower leg   Bathing assist Assist Level: Supervision/Verbal cueing     Upper Body Dressing/Undressing Upper body dressing   What is the patient wearing?: Pull over shirt    Upper body assist Assist Level: Independent    Lower Body Dressing/Undressing Lower body dressing      What is the patient wearing?: Ace wrap/stump shrinker     Lower body assist Assist for lower body dressing: Minimal Assistance - Patient > 75%     Toileting Toileting Toileting Activity did not occur Landscape architect and hygiene only): N/A (no void or bm) (patient reports using "pee jug" prior to this session)  Toileting assist Assist for toileting: Contact Guard/Touching assist     Transfers Chair/bed transfer  Transfers assist     Chair/bed transfer assist level: Supervision/Verbal cueing     Locomotion Ambulation   Ambulation assist   Ambulation activity did not occur: Refused  Assist level: Set up assist       Walk 10 feet activity   Assist  Walk 10 feet activity did not occur: Refused  Assist level: Set up assist     Walk 50 feet activity   Assist Walk 50 feet with 2 turns activity did not occur: Refused  Assist level: Set up assist      Walk 150 feet activity   Assist Walk 150 feet activity did not occur: Refused         Walk 10 feet on uneven surface  activity   Assist Walk 10 feet on uneven  surfaces activity did not occur: Refused         Wheelchair     Assist Is the patient using a wheelchair?: Yes Type of Wheelchair: Manual    Wheelchair assist level: Supervision/Verbal cueing Max wheelchair distance: >367ft    Wheelchair 50 feet with 2 turns activity    Assist        Assist Level: Supervision/Verbal cueing   Wheelchair 150 feet activity     Assist      Assist Level: Supervision/Verbal cueing   Blood pressure (!) 144/81, pulse 74, temperature 98.3 F (36.8 C), temperature source Oral, resp. rate 16, height 6' (1.829 m), weight 74 kg, SpO2 95 %.  Medical Problem List and Plan: 1. 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  2.  Antithrombotics: -DVT/anticoagulation:  Pharmaceutical: Heparin             -antiplatelet therapy: N/A 3. Phantom limb pain:   continue hydrocodone as needed  Continue gabapentin to 300mg  bid  -continue massage/tactile feedback to stump, visual therapy to help with pain, discussed mirror therapy 4. Mood: Emotional support             -antipsychotic agents: N/A 5. Neuropsych: This patient is capable of making decisions on his own behalf. 6. Skin/Wound Care: Routine skin check 7. Fluids/Electrolytes/Nutrition: Routine in and outs with follow-up chemistries 8.  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  Status post stereotactic brain biopsy 05/23/2021.  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 Follow-up with Dr. Cecil Cobbs.  Decadron therapy as indicated, this is being  tapered -6/13 CT head yesterday with  mild increase left frontal vasogenic edema, low dose decadron resumed  9.  Seizure prophylaxis.  Keppra dose increased to 750mg  BID, EEG ordered for seizure like activity and shows cortical dysfunctin 10.  Hyperlipidemia.  Continue Lipitor 11.  Tobacco abuse.  NicoDerm patch.  Counseling 12.  CAD/CABG.  No chest pain or shortness of breath 13. Hx of hypertension. He has renal artery narrowing. No home medications. Continue to monitor trend Increase amlodipine to 5mg  14. History of CVA 15. Constipation             -on senokot,   miralax, prn dulcolax supp. Add magnesium gluconate HS 16. Leukocytosis  8/9 improved down to 10.8 yesterday 17. Severe vitamin D deficiency level 14: started ergocalciferol 50,000U once per week for 7 weeks.  18. Acute blood loss anemia, mild  -HGB stable at 11.5 19. Hypoalbuminemia albumin 2.2  -6/12 appears he is eating most meals, continue to monitor  -6/13 Improved to 2.8, continue to monitor 20. Post-craniotomy edema: decadron increased    LOS: 9 days A FACE TO FACE EVALUATION WAS PERFORMED  Roger Solis 08/18/2021, 10:51 AM

## 2021-08-19 MED ORDER — SORBITOL 70 % SOLN
60.0000 mL | Freq: Once | Status: AC
Start: 2021-08-19 — End: 2021-08-19
  Administered 2021-08-19: 60 mL via ORAL
  Filled 2021-08-19: qty 60

## 2021-08-19 NOTE — Progress Notes (Signed)
Occupational Therapy Session Note  Patient Details  Name: Roger Solis MRN: [id number] Date of Birth: January 08, 1956  Today's Date: 08/19/2021 OT Individual Time: 1505-6979 OT Individual Time Calculation (min): 22 min    Short Term Goals: Week 1:  OT Short Term Goal 1 (Week 1): Patient will transfer to commode with min assist OT Short Term Goal 2 (Week 1): Patient will transfer to tub transfer bench with min asssist following instruction OT Short Term Goal 3 (Week 1): Patient will dress lower body with min assist OT Short Term Goal 4 (Week 1): Patient will complete toilet hygiene and clothing management with min assist  Skilled Therapeutic Interventions/Progress Updates:    Pt greeted seated on EOB. Pt reported feeling nauseous after lunch as well as nauseated from getting bowel meds. Pt declined need to go to the bathroom at this moment, but felt as though if he moved, he may throw up. OT provided pt with emesis bag. OT reviewed dc plan, OT goals, home modifications, and safe BADL participation in home environment. Discussed residual limb care and pt reported feeling confident in self-care and dc plan. Pt declined further participation in therapy at this time. Pt left seated EOB with alarm on, call bell in reach, and needs met.  Therapy Documentation Precautions:  Precautions Precautions: Fall Precaution Comments: seizure-like activity Restrictions Weight Bearing Restrictions: Yes LLE Weight Bearing: Non weight bearing \ Pain: Pain Assessment Pain Scale: 0-10 Pain Score: 6  Pain Location: Leg Pain Orientation: Left Pain Intervention(s): Repositioned  Therapy/Group: Individual Therapy  Valma Cava 08/19/2021, 1:46 PM

## 2021-08-19 NOTE — Progress Notes (Signed)
Physical Therapy Session Note  Patient Details  Name: Roger Solis MRN: [id number] Date of Birth: 1955-08-26  Today's Date: 08/19/2021 PT Individual Time: 1001-1056 PT Individual Time Calculation (min): 55 min   Short Term Goals: Week 1:  PT Short Term Goal 1 (Week 1): STG=LTG due to LOS  Skilled Therapeutic Interventions/Progress Updates:    Received pt semi-reclined in bed asleep, upon wakening [name], and reported pain 7/10 in L residual limb - RN notified and present to administer pain medication. Pt reported residual limb hurting more this morning due to attempting to sleep on L side for pressure relief on sacral wound. Session with emphasis on discharge planning, functional mobility/transfers, generalized strengthening and endurance, and dynamic standing balance/coordination. Pt performed bed mobility with mod I with HOB elevated and performed x4 squat<>pivot transfers with mod I throughout session. Pt able to set up transfers with supervision/mod I throughout session. Pt voided in urinal with set up assist then performed WC mobility 128ft x 2 trials using BUE and mod I to/from therapy gym - removed weights from [facility] as WC with tendency to steer towards L. Pt transferred sit<>stand with standard walker and supervision x 4 trials and performed the following exercises with BUE support and supervision with emphasis on LE strength/ROM: -LLE extension 2x10 -LLE abduction 2x10 -LLE flexion 2x10 -RLE heel raises 2x10 Pt transferred sit<>prone mod I and performed the following exercises with emphasis on hip flexor stretching: -prone R hamstring curls with 1.5lb ankle weight 2x12 -prone L hip extension 2x10 -prone<>prone on extended elbows x10 Pt transferred prone<>long sitting<>sitting EOM with min HHA due to sudden intense phantom pain spasms. Concluded session with pt sitting EOB, needs within reach, and bed alarm on. Provided pt with ice pack for L hip.   Therapy  Documentation Precautions:  Precautions Precautions: Fall Precaution Comments: seizure-like activity Restrictions Weight Bearing Restrictions: Yes LLE Weight Bearing: Non weight bearing  Therapy/Group: Individual Therapy Alfonse Alpers PT, DPT 08/19/2021, 6:53 AM

## 2021-08-19 NOTE — Plan of Care (Signed)
  Problem: RH Balance Goal: LTG: Patient will maintain dynamic sitting balance (OT) Description: LTG:  Patient will maintain dynamic sitting balance with assistance during activities of daily living (OT) Outcome: Completed/Met Goal: LTG Patient will maintain dynamic standing with ADLs (OT) Description: LTG:  Patient will maintain dynamic standing balance with assist during activities of daily living (OT)  Outcome: Not Met (add Reason) Note: Patient still requires supervision for sit<>stands and standing balance for safety-ESD   Problem: Sit to Stand Goal: LTG:  Patient will perform sit to stand in prep for activites of daily living with assistance level (OT) Description: LTG:  Patient will perform sit to stand in prep for activites of daily living with assistance level (OT) Outcome: Not Met (add Reason) Note: Patient still requires supervision for sit<>stands and standing balance for safety-ESD   Problem: RH Grooming Goal: LTG Patient will perform grooming w/assist,cues/equip (OT) Description: LTG: Patient will perform grooming with assist, with/without cues using equipment (OT) Outcome: Completed/Met   Problem: RH Bathing Goal: LTG Patient will bathe all body parts with assist levels (OT) Description: LTG: Patient will bathe all body parts with assist levels (OT) Outcome: Completed/Met   Problem: RH Dressing Goal: LTG Patient will perform upper body dressing (OT) Description: LTG Patient will perform upper body dressing with assist, with/without cues (OT). Outcome: Completed/Met Goal: LTG Patient will perform lower body dressing w/assist (OT) Description: LTG: Patient will perform lower body dressing with assist, with/without cues in positioning using equipment (OT) Outcome: Completed/Met   Problem: RH Toileting Goal: LTG Patient will perform toileting task (3/3 steps) with assistance level (OT) Description: LTG: Patient will perform toileting task (3/3 steps) with assistance  level (OT)  Outcome: Completed/Met   Problem: RH Toilet Transfers Goal: LTG Patient will perform toilet transfers w/assist (OT) Description: LTG: Patient will perform toilet transfers with assist, with/without cues using equipment (OT) Outcome: Completed/Met   Problem: RH Tub/Shower Transfers Goal: LTG Patient will perform tub/shower transfers w/assist (OT) Description: LTG: Patient will perform tub/shower transfers with assist, with/without cues using equipment (OT) Outcome: Completed/Met   Problem: RH Attention Goal: LTG Patient will demonstrate this level of attention during functional activites (OT) Description: LTG:  Patient will demonstrate this level of attention during functional activites  (OT) Outcome: Completed/Met   Problem: RH Awareness Goal: LTG: Patient will demonstrate awareness during functional activites type of (OT) Description: LTG: Patient will demonstrate awareness during functional activites type of (OT) Outcome: Completed/Met

## 2021-08-19 NOTE — Progress Notes (Signed)
SSE administered. Large stool returned. No complications noted. [name], LPN

## 2021-08-19 NOTE — Progress Notes (Signed)
PROGRESS NOTE   Subjective/Complaints: In bed watching TV. Reports constipation.   ROS: Denies fever, chills, CP, SOB, HA, Abd pain, Nausea, pain is well controlled, denies further seizure-like activity + constipation   Objective:   No results found. No results for input(s): "WBC", "HGB", "HCT", "PLT" in the last 72 hours.  No results for input(s): "NA", "K", "CL", "CO2", "GLUCOSE", "BUN", "CREATININE", "CALCIUM" in the last 72 hours.    Intake/Output Summary (Last 24 hours) at 08/19/2021 1230 Last data filed at 08/19/2021 0505 Gross per 24 hour  Intake 117 ml  Output 1100 ml  Net -983 ml      Pressure Injury 08/09/21 Buttocks Left Stage 2 -  Partial thickness loss of dermis presenting as a shallow open injury with a red, pink wound bed without slough. Top wound (Active)  08/09/21 1830  Location: Buttocks  Location Orientation: Left  Staging: Stage 2 -  Partial thickness loss of dermis presenting as a shallow open injury with a red, pink wound bed without slough.  Wound Description (Comments): Top wound  Present on Admission: Yes     Pressure Injury 08/09/21 Buttocks Left Stage 2 -  Partial thickness loss of dermis presenting as a shallow open injury with a red, pink wound bed without slough. Middle wound (Active)  08/09/21 1830  Location: Buttocks  Location Orientation: Left  Staging: Stage 2 -  Partial thickness loss of dermis presenting as a shallow open injury with a red, pink wound bed without slough.  Wound Description (Comments): Middle wound  Present on Admission: Yes     Pressure Injury 08/09/21 Buttocks Left Stage 2 -  Partial thickness loss of dermis presenting as a shallow open injury with a red, pink wound bed without slough. Bottom wound (Active)  08/09/21 1830  Location: Buttocks  Location Orientation: Left  Staging: Stage 2 -  Partial thickness loss of dermis presenting as a shallow open injury with  a red, pink wound bed without slough.  Wound Description (Comments): Bottom wound  Present on Admission: Yes     Pressure Injury 08/16/21 Buttocks Right Stage 2 -  Partial thickness loss of dermis presenting as a shallow open injury with a red, pink wound bed without slough. (Active)  08/16/21 1452  Location: Buttocks  Location Orientation: Right  Staging: Stage 2 -  Partial thickness loss of dermis presenting as a shallow open injury with a red, pink wound bed without slough.  Wound Description (Comments):   Present on Admission: No    Physical Exam: Vital Signs Blood pressure 117/67, pulse 82, temperature 98.1 F (36.7 C), temperature source Oral, resp. rate 18, height 6' (1.829 m), weight 74 kg, SpO2 96 %.  Constitutional: No distress . Vital signs reviewed. Working with therapy, BMI 22.13 HEENT: NCAT, conjugate gaze, oral membranes moist Neck: supple Cardiovascular: RRR without murmur.  Respiratory/Chest: CTA Bilaterally without wheezes or rales. Normal effort    GI/Abdomen: BS +, non-tender, non-distended, soft Ext: no clubbing, cyanosis, or edema Psych: pleasant and cooperative  Skin: right aka cdi, abrasion left knee. Warm and dry Neuro:  Alert and oriented x3, CN 2-12 intact other than mild facial weakness, follows commands, speech and  language appears to be intact, mild L facial weakness noted(chronic), BL UE strength 5/5, RLE 4+/5 Hip flex, knee extension 5/5, Ankle PF and DF 4+/5, not able to lift L residual limb, sensation decreased in stocking glove distribution RLE, mild tremor Musculoskeletal:  R AKA  remains sl tender to palpation around incision, decreased swelling, good shape. Functional mobility: sit to stand with S, transferring with S   Assessment/Plan: 1. Functional deficits which require 3+ hours per day of interdisciplinary therapy in a comprehensive inpatient rehab setting. Physiatrist is providing close team supervision and 24 hour management of active  medical problems listed below. Physiatrist and rehab team continue to assess barriers to discharge/monitor patient progress toward functional and medical goals  Care Tool:  Bathing    Body parts bathed by patient: Right arm, Left arm, Chest, Abdomen, Right upper leg, Right lower leg, Left upper leg, Face, Front perineal area, Buttocks   Body parts bathed by helper: Right arm, Left arm, Chest, Abdomen, Front perineal area, Buttocks, Right upper leg, Left upper leg, Right lower leg, Left lower leg Body parts n/a: Left lower leg   Bathing assist Assist Level: Supervision/Verbal cueing     Upper Body Dressing/Undressing Upper body dressing   What is the patient wearing?: Pull over shirt    Upper body assist Assist Level: Independent    Lower Body Dressing/Undressing Lower body dressing      What is the patient wearing?: Ace wrap/stump shrinker     Lower body assist Assist for lower body dressing: Minimal Assistance - Patient > 75%     Toileting Toileting Toileting Activity did not occur (Clothing management and hygiene only): N/A (no void or bm) (patient reports using "pee jug" prior to this session)  Toileting assist Assist for toileting: Contact Guard/Touching assist     Transfers Chair/bed transfer  Transfers assist     Chair/bed transfer assist level: Independent with assistive device (squat<>pivot)     Locomotion Ambulation   Ambulation assist   Ambulation activity did not occur: Refused  Assist level: Set up assist       Walk 10 feet activity   Assist  Walk 10 feet activity did not occur: Refused  Assist level: Set up assist     Walk 50 feet activity   Assist Walk 50 feet with 2 turns activity did not occur: Refused  Assist level: Set up assist      Walk 150 feet activity   Assist Walk 150 feet activity did not occur: Refused         Walk 10 feet on uneven surface  activity   Assist Walk 10 feet on uneven surfaces activity did  not occur: Refused         Wheelchair     Assist Is the patient using a wheelchair?: Yes Type of Wheelchair: Manual    Wheelchair assist level: Independent Max wheelchair distance: 142ft    Wheelchair 50 feet with 2 turns activity    Assist        Assist Level: Independent   Wheelchair 150 feet activity     Assist      Assist Level: Independent   Blood pressure 117/67, pulse 82, temperature 98.1 F (36.7 C), temperature source Oral, resp. rate 18, height 6' (1.829 m), weight 74 kg, SpO2 96 %.  Medical Problem List and Plan: 1. 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  2.  Antithrombotics: -DVT/anticoagulation:  Pharmaceutical: Heparin             -antiplatelet therapy: N/A 3. Phantom limb pain:   continue hydrocodone as needed  Continue gabapentin to 300mg  bid  -continue massage/tactile feedback to stump, visual therapy to help with pain, discussed mirror therapy 4. Mood: Emotional support             -antipsychotic agents: N/A 5. Neuropsych: This patient is capable of making decisions on his own behalf. 6. Skin/Wound Care: Routine skin check 7. Fluids/Electrolytes/Nutrition: Routine in and outs with follow-up chemistries 8.  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  Status post stereotactic brain biopsy 05/23/2021.  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 Follow-up with Dr. Cecil Cobbs.  Decadron therapy as indicated, this is being tapered -6/13 CT head yesterday with  mild increase left frontal vasogenic  edema, low dose decadron resumed  9.  Seizure prophylaxis.  Keppra dose increased to 750mg  BID, EEG ordered for seizure like activity and shows cortical dysfunctin -no further seizure activity reported 10.  Hyperlipidemia.  Continue Lipitor 11.  Tobacco abuse.  NicoDerm patch.  Counseling 12.  CAD/CABG.  No chest pain or shortness of breath 13. Hx of hypertension. He has renal artery narrowing. No home medications. Continue to monitor trend Increase amlodipine to 5mg  -6/17 BP well controlled, monitor 14. History of CVA 15. Constipation             -on senokot,   miralax, prn dulcolax supp. Add magnesium gluconate HS  -6/17 order Sorbitol 16. Leukocytosis  8/9 improved down to 10.8 yesterday 17. Severe vitamin D deficiency level 14: started ergocalciferol 50,000U once per week for 7 weeks.  18. Acute blood loss anemia, mild  -HGB stable at 11.5 19. Hypoalbuminemia albumin 2.2  -6/12 appears he is eating most meals, continue to monitor  -6/13 Improved to 2.8, continue to monitor 20. Post-craniotomy edema: decadron increased    LOS: 10 days A FACE TO FACE EVALUATION WAS PERFORMED  Jennye Boroughs 08/19/2021, 12:30 PM

## 2021-08-20 NOTE — Progress Notes (Signed)
PROGRESS NOTE   Subjective/Complaints: Packing up ready to dc home. Reports some continued mild pain at his R AKA.   ROS: Denies fever, chills, CP, SOB, HA, Abd pain, Nausea, pain is well controlled, denies further seizure-like activity + constipation   Objective:   No results found. No results for input(s): "WBC", "HGB", "HCT", "PLT" in the last 72 hours.  No results for input(s): "NA", "K", "CL", "CO2", "GLUCOSE", "BUN", "CREATININE", "CALCIUM" in the last 72 hours.    Intake/Output Summary (Last 24 hours) at 08/20/2021 0817 Last data filed at 08/20/2021 0030 Gross per 24 hour  Intake 640 ml  Output 650 ml  Net -10 ml      Pressure Injury 08/09/21 Buttocks Left Stage 2 -  Partial thickness loss of dermis presenting as a shallow open injury with a red, pink wound bed without slough. Top wound (Active)  08/09/21 1830  Location: Buttocks  Location Orientation: Left  Staging: Stage 2 -  Partial thickness loss of dermis presenting as a shallow open injury with a red, pink wound bed without slough.  Wound Description (Comments): Top wound  Present on Admission: Yes     Pressure Injury 08/09/21 Buttocks Left Stage 2 -  Partial thickness loss of dermis presenting as a shallow open injury with a red, pink wound bed without slough. Middle wound (Active)  08/09/21 1830  Location: Buttocks  Location Orientation: Left  Staging: Stage 2 -  Partial thickness loss of dermis presenting as a shallow open injury with a red, pink wound bed without slough.  Wound Description (Comments): Middle wound  Present on Admission: Yes     Pressure Injury 08/09/21 Buttocks Left Stage 2 -  Partial thickness loss of dermis presenting as a shallow open injury with a red, pink wound bed without slough. Bottom wound (Active)  08/09/21 1830  Location: Buttocks  Location Orientation: Left  Staging: Stage 2 -  Partial thickness loss of dermis  presenting as a shallow open injury with a red, pink wound bed without slough.  Wound Description (Comments): Bottom wound  Present on Admission: Yes     Pressure Injury 08/16/21 Buttocks Right Stage 2 -  Partial thickness loss of dermis presenting as a shallow open injury with a red, pink wound bed without slough. (Active)  08/16/21 1452  Location: Buttocks  Location Orientation: Right  Staging: Stage 2 -  Partial thickness loss of dermis presenting as a shallow open injury with a red, pink wound bed without slough.  Wound Description (Comments):   Present on Admission: No    Physical Exam: Vital Signs Blood pressure 138/75, pulse 78, temperature 98.7 F (37.1 C), resp. rate 17, height 6' (1.829 m), weight 74 kg, SpO2 96 %.  Constitutional: No distress . Vital signs reviewed. Working with therapy, BMI 22.13 HEENT: NCAT, conjugate gaze, oral membranes moist Neck: supple Cardiovascular: RRR without murmur.  Respiratory/Chest: CTA Bilaterally without wheezes or rales. Normal effort    GI/Abdomen: BS +, non-tender, non-distended, soft Ext: no clubbing, cyanosis, or edema Psych: pleasant and cooperative  Skin: right aka cdi, abrasion left knee. Warm and dry Neuro:  Alert and oriented x3, CN 2-12 intact other than mild  facial weakness, follows commands, speech and language appears to be intact, mild L facial weakness noted(chronic), BL UE strength 5/5, RLE 4+/5 Hip flex, knee extension 5/5, Ankle PF and DF 4+/5, not able to lift L residual limb, sensation decreased in stocking glove distribution RLE, mild tremor Musculoskeletal:  R AKA  remains sl tender to palpation around incision, decreased swelling, good shape. No warmth or drainage or signs of infection. Minimal redness where stable enter skin.  Functional mobility: sit to stand with S, transferring with S   Assessment/Plan: 1. Functional deficits which require 3+ hours per day of interdisciplinary therapy in a comprehensive  inpatient rehab setting. Physiatrist is providing close team supervision and 24 hour management of active medical problems listed below. Physiatrist and rehab team continue to assess barriers to discharge/monitor patient progress toward functional and medical goals  Care Tool:  Bathing    Body parts bathed by patient: Right arm, Left arm, Chest, Abdomen, Right upper leg, Right lower leg, Left upper leg, Face, Front perineal area, Buttocks   Body parts bathed by helper: Right arm, Left arm, Chest, Abdomen, Front perineal area, Buttocks, Right upper leg, Left upper leg, Right lower leg, Left lower leg Body parts n/a: Left lower leg   Bathing assist Assist Level: Supervision/Verbal cueing     Upper Body Dressing/Undressing Upper body dressing   What is the patient wearing?: Pull over shirt    Upper body assist Assist Level: Independent    Lower Body Dressing/Undressing Lower body dressing      What is the patient wearing?: Pants     Lower body assist Assist for lower body dressing: Supervision/Verbal cueing     Toileting Toileting Toileting Activity did not occur (Clothing management and hygiene only): N/A (no void or bm) (patient reports using "pee jug" prior to this session)  Toileting assist Assist for toileting: Independent with assistive device     Transfers Chair/bed transfer  Transfers assist     Chair/bed transfer assist level: Independent with assistive device     Locomotion Ambulation   Ambulation assist   Ambulation activity did not occur: Refused  Assist level: Set up assist       Walk 10 feet activity   Assist  Walk 10 feet activity did not occur: Refused  Assist level: Set up assist     Walk 50 feet activity   Assist Walk 50 feet with 2 turns activity did not occur: Refused  Assist level: Set up assist      Walk 150 feet activity   Assist Walk 150 feet activity did not occur: Refused         Walk 10 feet on uneven  surface  activity   Assist Walk 10 feet on uneven surfaces activity did not occur: Refused         Wheelchair     Assist Is the patient using a wheelchair?: Yes Type of Wheelchair: Manual    Wheelchair assist level: Independent Max wheelchair distance: 1109ft    Wheelchair 50 feet with 2 turns activity    Assist        Assist Level: Independent   Wheelchair 150 feet activity     Assist      Assist Level: Independent   Blood pressure 138/75, pulse 78, temperature 98.7 F (37.1 C), resp. rate 17, height 6' (1.829 m), weight 74 kg, SpO2 96 %.  Medical Problem List and Plan: 1. 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   -DC home today  -Advised to Call ortho or go to ER if  redness, drainage, warmth develop or worsen at incision site or he he develops fevers, chills or malaise 2.  Antithrombotics: -DVT/anticoagulation:  Pharmaceutical: Heparin             -antiplatelet therapy: N/A 3. Phantom limb pain:   continue hydrocodone as needed  Continue gabapentin to 300mg  bid  -continue massage/tactile feedback to stump, visual therapy to help with pain, discussed mirror therapy 4. Mood: Emotional support             -antipsychotic agents: N/A 5. Neuropsych: This patient is capable of making decisions on his own behalf. 6. Skin/Wound Care: Routine skin check 7. Fluids/Electrolytes/Nutrition: Routine in and outs with follow-up chemistries 8.  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  Status post stereotactic brain biopsy 05/23/2021.   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 Follow-up with Dr. Cecil Cobbs.  Decadron therapy as indicated, this is being tapered -6/13 CT head yesterday with  mild increase left frontal vasogenic edema, low dose decadron resumed  9.  Seizure prophylaxis.  Keppra dose increased to 750mg  BID, EEG ordered for seizure like activity and shows cortical dysfunctin -no further seizure activity reported 10.  Hyperlipidemia.  Continue Lipitor 11.  Tobacco abuse.  NicoDerm patch.  Counseling 12.  CAD/CABG.  No chest pain or shortness of breath 13. Hx of hypertension. He has renal artery narrowing. No home medications. Continue to monitor trend Increase amlodipine to 5mg  -6/18 Well controlled, follow 14. History of CVA 15. Constipation             -on senokot,   miralax, prn dulcolax supp. Add magnesium gluconate HS  -6/17 order Sorbitol  6/18 BM yesterday 16. Leukocytosis  8/9 improved down to 10.8 yesterday 17. Severe vitamin D deficiency level 14: started ergocalciferol 50,000U once per week for 7 weeks.  18. Acute blood loss anemia, mild  -HGB stable at 11.5 19. Hypoalbuminemia albumin 2.2  -6/12 appears he is eating most meals, continue to monitor  -6/13 Improved to 2.8, continue to monitor 20. Post-craniotomy edema: decadron increased    LOS: 11 days A FACE TO FACE EVALUATION WAS PERFORMED  Jennye Boroughs 08/20/2021, 8:17 AM

## 2021-08-30 ENCOUNTER — Inpatient Hospital Stay (HOSPITAL_COMMUNITY): Payer: No Typology Code available for payment source

## 2021-08-30 ENCOUNTER — Inpatient Hospital Stay (HOSPITAL_COMMUNITY)
Admission: EM | Admit: 2021-08-30 | Discharge: 2021-09-01 | DRG: 100 | Disposition: A | Discharge status: Home / Self Care | Payer: No Typology Code available for payment source | Attending: Internal Medicine | Admitting: Internal Medicine

## 2021-08-30 ENCOUNTER — Emergency Department (HOSPITAL_COMMUNITY): Payer: No Typology Code available for payment source

## 2021-08-30 DIAGNOSIS — R29810 Facial weakness: Secondary | ICD-10-CM | POA: Diagnosis present

## 2021-08-30 DIAGNOSIS — Z89511 Acquired absence of right leg below knee: Secondary | ICD-10-CM

## 2021-08-30 DIAGNOSIS — Z66 Do not resuscitate: Secondary | ICD-10-CM | POA: Diagnosis present

## 2021-08-30 DIAGNOSIS — G936 Cerebral edema: Secondary | ICD-10-CM | POA: Diagnosis present

## 2021-08-30 DIAGNOSIS — I10 Essential (primary) hypertension: Secondary | ICD-10-CM | POA: Diagnosis present

## 2021-08-30 DIAGNOSIS — Z85841 Personal history of malignant neoplasm of brain: Secondary | ICD-10-CM | POA: Diagnosis not present

## 2021-08-30 DIAGNOSIS — Z8249 Family history of ischemic heart disease and other diseases of the circulatory system: Secondary | ICD-10-CM

## 2021-08-30 DIAGNOSIS — Z923 Personal history of irradiation: Secondary | ICD-10-CM

## 2021-08-30 DIAGNOSIS — R569 Unspecified convulsions: Principal | ICD-10-CM

## 2021-08-30 DIAGNOSIS — C7931 Secondary malignant neoplasm of brain: Secondary | ICD-10-CM

## 2021-08-30 DIAGNOSIS — G40901 Epilepsy, unspecified, not intractable, with status epilepticus: Secondary | ICD-10-CM | POA: Diagnosis present

## 2021-08-30 DIAGNOSIS — G9340 Encephalopathy, unspecified: Secondary | ICD-10-CM | POA: Diagnosis not present

## 2021-08-30 DIAGNOSIS — R1310 Dysphagia, unspecified: Secondary | ICD-10-CM | POA: Diagnosis present

## 2021-08-30 DIAGNOSIS — Z8616 Personal history of COVID-19: Secondary | ICD-10-CM | POA: Diagnosis not present

## 2021-08-30 DIAGNOSIS — Z85118 Personal history of other malignant neoplasm of bronchus and lung: Secondary | ICD-10-CM | POA: Diagnosis not present

## 2021-08-30 DIAGNOSIS — L89892 Pressure ulcer of other site, stage 2: Secondary | ICD-10-CM | POA: Diagnosis present

## 2021-08-30 DIAGNOSIS — Z89612 Acquired absence of left leg above knee: Secondary | ICD-10-CM | POA: Diagnosis not present

## 2021-08-30 DIAGNOSIS — Z20822 Contact with and (suspected) exposure to covid-19: Secondary | ICD-10-CM | POA: Diagnosis present

## 2021-08-30 DIAGNOSIS — G40101 Localization-related (focal) (partial) symptomatic epilepsy and epileptic syndromes with simple partial seizures, not intractable, with status epilepticus: Principal | ICD-10-CM | POA: Diagnosis present

## 2021-08-30 DIAGNOSIS — R471 Dysarthria and anarthria: Secondary | ICD-10-CM | POA: Diagnosis present

## 2021-08-30 DIAGNOSIS — C3490 Malignant neoplasm of unspecified part of unspecified bronchus or lung: Secondary | ICD-10-CM | POA: Diagnosis not present

## 2021-08-30 DIAGNOSIS — Z9221 Personal history of antineoplastic chemotherapy: Secondary | ICD-10-CM | POA: Diagnosis not present

## 2021-08-30 DIAGNOSIS — F17210 Nicotine dependence, cigarettes, uncomplicated: Secondary | ICD-10-CM | POA: Diagnosis present

## 2021-08-30 DIAGNOSIS — I739 Peripheral vascular disease, unspecified: Secondary | ICD-10-CM | POA: Diagnosis not present

## 2021-08-30 DIAGNOSIS — Z89512 Acquired absence of left leg below knee: Secondary | ICD-10-CM | POA: Insufficient documentation

## 2021-08-30 DIAGNOSIS — I2510 Atherosclerotic heart disease of native coronary artery without angina pectoris: Secondary | ICD-10-CM | POA: Diagnosis present

## 2021-08-30 DIAGNOSIS — E785 Hyperlipidemia, unspecified: Secondary | ICD-10-CM | POA: Diagnosis present

## 2021-08-30 LAB — CBC
HCT: 39.8 % (ref 39.0–52.0)
Hemoglobin: 12.6 g/dL — ABNORMAL LOW (ref 13.0–17.0)
MCH: 30.3 pg (ref 26.0–34.0)
MCHC: 31.7 g/dL (ref 30.0–36.0)
MCV: 95.7 fL (ref 80.0–100.0)
Platelets: 253 10*3/uL (ref 150–400)
RBC: 4.16 MIL/uL — ABNORMAL LOW (ref 4.22–5.81)
RDW: 15.9 % — ABNORMAL HIGH (ref 11.5–15.5)
WBC: 10.5 10*3/uL (ref 4.0–10.5)
nRBC: 0 % (ref 0.0–0.2)

## 2021-08-30 LAB — COMPREHENSIVE METABOLIC PANEL
ALT: 23 U/L (ref 0–44)
AST: 24 U/L (ref 15–41)
Albumin: 3.1 g/dL — ABNORMAL LOW (ref 3.5–5.0)
Alkaline Phosphatase: 66 U/L (ref 38–126)
Anion gap: 11 (ref 5–15)
BUN: 17 mg/dL (ref 8–23)
CO2: 22 mmol/L (ref 22–32)
Calcium: 9.3 mg/dL (ref 8.9–10.3)
Chloride: 104 mmol/L (ref 98–111)
Creatinine, Ser: 0.69 mg/dL (ref 0.61–1.24)
GFR, Estimated: >60 mL/min (ref >60)
Glucose, Bld: 111 mg/dL — ABNORMAL HIGH (ref 70–99)
Potassium: 4.4 mmol/L (ref 3.5–5.1)
Sodium: 137 mmol/L (ref 135–145)
Total Bilirubin: 0.4 mg/dL (ref 0.3–1.2)
Total Protein: 6.6 g/dL (ref 6.5–8.1)

## 2021-08-30 LAB — I-STAT CHEM 8, ED
BUN: 20 mg/dL (ref 8–23)
Calcium, Ion: 1.03 mmol/L — ABNORMAL LOW (ref 1.15–1.40)
Chloride: 103 mmol/L (ref 98–111)
Creatinine, Ser: 0.5 mg/dL — ABNORMAL LOW (ref 0.61–1.24)
Glucose, Bld: 110 mg/dL — ABNORMAL HIGH (ref 70–99)
HCT: 39 % (ref 39.0–52.0)
Hemoglobin: 13.3 g/dL (ref 13.0–17.0)
Potassium: 4.3 mmol/L (ref 3.5–5.1)
Sodium: 137 mmol/L (ref 135–145)
TCO2: 24 mmol/L (ref 22–32)

## 2021-08-30 LAB — URINALYSIS, ROUTINE W REFLEX MICROSCOPIC
Bilirubin Urine: NEGATIVE
Glucose, UA: NEGATIVE mg/dL
Hgb urine dipstick: NEGATIVE
Ketones, ur: NEGATIVE mg/dL
Leukocytes,Ua: NEGATIVE
Nitrite: NEGATIVE
Protein, ur: NEGATIVE mg/dL
Specific Gravity, Urine: 1.01 (ref 1.005–1.030)
pH: 6 (ref 5.0–8.0)

## 2021-08-30 LAB — CBG MONITORING, ED
Glucose-Capillary: 109 mg/dL — ABNORMAL HIGH (ref 70–99)
Glucose-Capillary: 101 mg/dL — ABNORMAL HIGH (ref 70–99)

## 2021-08-30 LAB — RAPID URINE DRUG SCREEN, HOSP PERFORMED
Amphetamines: NOT DETECTED
Barbiturates: NOT DETECTED
Benzodiazepines: NOT DETECTED
Cocaine: NOT DETECTED
Opiates: POSITIVE — AB
Tetrahydrocannabinol: NOT DETECTED

## 2021-08-30 LAB — ETHANOL: Alcohol, Ethyl (B): <10 mg/dL (ref <10)

## 2021-08-30 LAB — DIFFERENTIAL
Abs Immature Granulocytes: 0.09 10*3/uL — ABNORMAL HIGH (ref 0.00–0.07)
Basophils Absolute: 0 10*3/uL (ref 0.0–0.1)
Basophils Relative: 0 %
Eosinophils Absolute: 0.1 10*3/uL (ref 0.0–0.5)
Eosinophils Relative: 1 %
Immature Granulocytes: 1 %
Lymphocytes Relative: 14 %
Lymphs Abs: 1.5 10*3/uL (ref 0.7–4.0)
Monocytes Absolute: 0.8 10*3/uL (ref 0.1–1.0)
Monocytes Relative: 8 %
Neutro Abs: 8.1 10*3/uL — ABNORMAL HIGH (ref 1.7–7.7)
Neutrophils Relative %: 76 %

## 2021-08-30 LAB — RESP PANEL BY RT-PCR (FLU A&B, COVID) ARPGX2
Influenza A by PCR: NEGATIVE
Influenza B by PCR: NEGATIVE
SARS Coronavirus 2 by RT PCR: NEGATIVE

## 2021-08-30 LAB — PROTIME-INR
INR: 1 (ref 0.8–1.2)
Prothrombin Time: 13.3 seconds (ref 11.4–15.2)

## 2021-08-30 LAB — APTT: aPTT: 28 seconds (ref 24–36)

## 2021-08-30 MED ORDER — LORAZEPAM 2 MG/ML IJ SOLN: 2.0000 mg | Freq: Once | INTRAMUSCULAR | Status: AC

## 2021-08-30 MED ORDER — DOCUSATE SODIUM 100 MG PO CAPS: 100.0000 mg | ORAL_CAPSULE | Freq: Two times a day (BID) | ORAL | Status: DC | PRN

## 2021-08-30 MED ORDER — LORAZEPAM 2 MG/ML IJ SOLN
2.0000 mg | Freq: Once | INTRAMUSCULAR | Status: AC
  Administered 2021-08-30: 2 mg via INTRAVENOUS
  Filled 2021-08-30: qty 1

## 2021-08-30 MED ORDER — HYDRALAZINE HCL 20 MG/ML IJ SOLN: 10.0000 mg | INTRAMUSCULAR | Status: DC | PRN

## 2021-08-30 MED ORDER — LORAZEPAM 2 MG/ML IJ SOLN
2.0000 mg | Freq: Once | INTRAMUSCULAR | Status: AC
Start: 2021-08-30 — End: 2021-08-30
  Filled 2021-08-30: qty 1

## 2021-08-30 MED ORDER — HEPARIN SODIUM (PORCINE) 5000 UNIT/ML IJ SOLN
5000.0000 [IU] | Freq: Three times a day (TID) | INTRAMUSCULAR | Status: DC
  Administered 2021-08-31 – 2021-09-01 (×6): 5000 [IU] via SUBCUTANEOUS
  Filled 2021-08-30 (×6): qty 1

## 2021-08-30 MED ORDER — LEVETIRACETAM IN NACL 1500 MG/100ML IV SOLN
1500.0000 mg | Freq: Two times a day (BID) | INTRAVENOUS | Status: DC
  Administered 2021-08-31 – 2021-09-01 (×3): 1500 mg via INTRAVENOUS
  Filled 2021-08-30 (×3): qty 100

## 2021-08-30 MED ORDER — LACTATED RINGERS IV SOLN
INTRAVENOUS | Status: DC
Start: 2021-08-30 — End: 2021-09-01

## 2021-08-30 MED ORDER — LORAZEPAM 2 MG/ML IJ SOLN
INTRAMUSCULAR | Status: AC
  Administered 2021-08-30: 2 mg
  Filled 2021-08-30: qty 1

## 2021-08-30 MED ORDER — VALPROATE SODIUM 100 MG/ML IV SOLN
1500.0000 mg | Freq: Once | INTRAVENOUS | Status: AC
  Administered 2021-08-31: 1500 mg via INTRAVENOUS
  Filled 2021-08-30: qty 15

## 2021-08-30 MED ORDER — POLYETHYLENE GLYCOL 3350 17 G PO PACK
17.0000 g | PACK | Freq: Every day | ORAL | Status: DC | PRN
Start: 2021-08-30 — End: 2021-09-01

## 2021-08-30 MED ORDER — VALPROATE SODIUM 100 MG/ML IV SOLN
500.0000 mg | Freq: Three times a day (TID) | INTRAVENOUS | Status: DC
  Administered 2021-08-31 – 2021-09-01 (×4): 500 mg via INTRAVENOUS
  Filled 2021-08-30 (×10): qty 5

## 2021-08-30 MED ORDER — DEXAMETHASONE SODIUM PHOSPHATE 4 MG/ML IJ SOLN
4.0000 mg | Freq: Four times a day (QID) | INTRAMUSCULAR | Status: DC
  Administered 2021-08-31 – 2021-09-01 (×6): 4 mg via INTRAVENOUS
  Filled 2021-08-30 (×6): qty 1

## 2021-08-30 MED ORDER — DEXAMETHASONE SODIUM PHOSPHATE 10 MG/ML IJ SOLN
10.0000 mg | Freq: Once | INTRAMUSCULAR | Status: AC
  Administered 2021-08-31: 10 mg via INTRAVENOUS
  Filled 2021-08-30: qty 1

## 2021-08-30 MED ORDER — LEVETIRACETAM IN NACL 1000 MG/100ML IV SOLN
3000.0000 mg | Freq: Once | INTRAVENOUS | Status: AC
  Administered 2021-08-30: 3000 mg via INTRAVENOUS
  Filled 2021-08-30: qty 300

## 2021-08-30 MED ORDER — LORAZEPAM 2 MG/ML IJ SOLN
INTRAMUSCULAR | Status: AC
  Administered 2021-08-30: 2 mg via INTRAMUSCULAR
  Filled 2021-08-30: qty 1

## 2021-08-30 NOTE — ED Notes (Signed)
2L [place] applied on pt as the pt's O2 dropped down to 88% after the ativan. Pt's O2 currently at 97% on 2L [place].

## 2021-08-30 NOTE — ED Notes (Signed)
Pt exhibiting L facial twitching again at this time. MD Lindzen made aware. 2mg  ativan ordered.

## 2021-08-30 NOTE — Progress Notes (Signed)
LTM EEG hooked up and running - no initial skin breakdown - push button tested - neuro notified. [facility] monitoring.  

## 2021-08-30 NOTE — ED Provider Triage Note (Addendum)
Emergency Medicine Provider Triage Evaluation Note  Juris Gosnell , a 66 y.o. male  was evaluated in triage.  Pt complains of seizure. Pt with hx of brain cancer, seizures on keppra who is having facial twitches throughout the day today consistent with his seizure.  Took his keppra today.   Review of Systems  Positive: As above Negative: As above  Physical Exam  BP (!) 165/83   Pulse 87   Temp 98.6 F (37 C) (Oral)   Resp 18   SpO2 98%  Gen:   Awake, L facial twitch Resp:  Normal effort  MSK:   Moves extremities without difficulty  Other:    Medical Decision Making  Medically screening exam initiated at 3:53 PM.  Appropriate orders placed.  [name] was informed that the remainder of the evaluation will be completed by another provider, this initial triage assessment does not replace that evaluation, and the importance of remaining in the ED until their evaluation is complete.  Pt is actively having seizure in the triage room.  Ativan 2mg  IM given.  Work up initiated.    Domenic Moras, PA-C 08/30/21 1554  ADDENDUM: family also report pt experience L face numbness an hr ago prior to the onset of his seizure.  Hx of brain cancer.  Code stroke activated.  Need to r/o hemorrhagic stroke as well. Care discussed with Dr. Matilde Sprang.    Domenic Moras, PA-C 08/30/21 1600

## 2021-08-30 NOTE — ED Notes (Signed)
EEG at bedside.

## 2021-08-30 NOTE — ED Notes (Signed)
Pt daughter, Ishmael Holter, is going home for the night. Would like to be called if pt goes upstairs, number in chart

## 2021-08-30 NOTE — ED Notes (Signed)
Pt started exhibiting R hand and foot twitching. MD Cheral Marker made aware.

## 2021-08-30 NOTE — Progress Notes (Signed)
There were two STAT orders pending when I came in. I asked Lindzen which STAT order he wanted done first. Will place the STAT routine before I place the LTM STAT.

## 2021-08-30 NOTE — ED Notes (Signed)
Pt is too lethargic to attempt swallow screen at this time

## 2021-08-30 NOTE — Consult Note (Addendum)
NEURO HOSPITALIST CONSULT NOTE   Requestig physician: Dr. Armandina Gemma  Reason for Consult: Focal status epilepticus  History obtained from:  Daughter and Chart     HPI:                                                                                                                                          Roger Solis is an 66 y.o. 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   Per his daughter, who he lives with, he developed left facial "swelling" this morning, but was otherwise at his neurological baseline with fluent and nondysarthric speech. LKN was 1400 regarding neurological function. At 1530 daughter found patient to be choking. He was able to state that he was having difficulty swallowing. She then noted that the left side of his mouth would not move normally. Soon afterwards, he had onset of left face and arm twitching. Per daughter, when she said she was going to take him to the hospital, he kept perseverating "no". Speech was noted to be impaired with dysarthria and decreased fluency.   He was last seen at [facility] on 05/05/21 by Neurology after having had 3 breakthrough seizures. Dr. Ricarda Frame note at that time documents the following: "Roger Solis is a 66 y.o. male with PMH significant for HTN, brain tumor and previous treatment for brain tumor about a year ago at [place] in [place] but record unavailable. Per  family, was unable to resect the tumor and got chemo + radiation + immunotherapy. He comes in with witnessed seizures x 3. His L face and L arm started twitching, he was conscious. he then had secondary generalization and lost consciousness. He did have a seizure immediately after the surgery at [facility] but was felt to be related to surgery and not started on AEDs at that time. No tongue biting and urinary incontinence." MRI brain during that visit had revealed right frontal contiguous enhancing lesions with different appearances. A superficial lesion was felt to represent a surgical cavity or original tumor and deeper lesion was felt to represent radiation necrosis. Also noted was a 9 mm left frontal lesion with edema that was thought to represent additional focus of tumor. Curvilinear reduced diffusion anterosuperior to the more superficial right frontal lesion was noted, but  was felt unlikely to represent small acute infarction. The patient was managed with Keppra, which he has continued as an outpatient since then.   He has been seeing Dr. 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 He has been on a Dexamethasone taper, was 4 mg qd in early May, reduced to 2 mg and now on 1 mg daily. His last seizure was postoperatively after the AKA and at that time his Keppra was increased from 500 mg BID to 750 mg BID, which he has been compliant with.    Excerpt from 07/20/21 [facility] follow up note: "Diagnosis: Metastatic Squamous cell carcinoma of lung with brain mets- February 2022 Tx Intent: Palliative.  Tx:  1. 04/2020: Brain mets noted on scan. Surgery attempted but unsuccessful. Radonc  did Gammaknife. 2. Reviewed at [facility] tumor board and started 6 cycles of Chemo  (carbo/paclitaxel weekly) + RT to chest (C1-C7 07/20/2020-06/21/2020) 3. Cemiplimab monthly x 7 cycles (07/20/2020-12/01/2020) Disease  recurrence On PET October 2022- decrease metabolic acitvity in left lung  resolved. Increased acitivity at the rectosigmoid colon 4. 12/2020: Gamma knife again for recurrent brain mets 5. 02/2021: MRI brain with increased size of previous metastatic lesions in the  frontal lobes and new metastatic lesions in the bilateral occipital lobes  consistent with disease progression. Discharged on Keppra 567m BID due to  seizures.  5. Per [facility]: added Carboplatin/paclitaxel + cemiplimab however stopped  carbo/pac after 1 cycles. Missed Cemiplimab dose in February. [place].  -C1 Cemiplimab 05/17/21 -C2 Cemiplimab held 04/07 2/2 to being on steroids."  Last visit to Dr. [facility] was on 5/22 at which time the patient was doing well on Decadron with improving left sided neurological symptoms.  Recent CT C/A/P (420/23): 1. Presumed posttreatment changes of the left upper lobe with  linear subpleural opacity in the left lung apex measuring up to  2.2 cm. Correlation with prior examinations would be helpful, if  available.  2. Mild bibasilar bronchial wall thickening with subpleural  linear reticulation, likely reflecting a combination of  atelectasis and infection/aspiration. Recommend close attention  on follow-up imaging.  3. No evidence of abdominopelvic metastatic disease  EEG 08/15/21: This study is suggestive of cortical dysfunction arising from right centro-parietal region likely secondary to underlying craniotomy. No seizures or epileptiform discharges were seen throughout the recording.  Past Medical History:  Diagnosis Date   CAD (coronary artery disease)    History of brain cancer    HLD (hyperlipidemia)    Hypertension    [place]) [place]     Past Surgical History:  Procedure Laterality Date   AMPUTATION Left 08/06/2021   Procedure: LEFTAMPUTATION ABOVE KNEE;  Surgeon: DAngelia Mould MD;  [place] [facility]   Service: Vascular;  Laterality: Left;   APPLICATION OF CRANIAL NAVIGATION N/A 05/23/2021   Procedure: APPLICATION OF CRANIAL NAVIGATION;  Surgeon: EKristeen Miss MD;  [place] [facility]  Service: Neurosurgery;  Laterality: N/A;   double bypass  2012   FRAMELESS  BIOPSY WITH BRAINLAB N/A 05/23/2021   Procedure: Stereotactic needle biopsy of the brain w/brainlab;  Surgeon: EKristeen Miss MD;  [place] [facility]  Service: Neurosurgery;  Laterality: N/A;    Family History  Problem Relation Age of Onset   Heart disease Mother    Heart disease Father              Social History:  reports that he has been smoking cigarettes. He has been smoking an average of 1 pack per day. He has never used smokeless tobacco. He reports current alcohol use. He reports that he does not use drugs.  No Known Allergies  MEDICATIONS:                                                                                                                     No current facility-administered medications on file prior to encounter.   Current Outpatient Medications on File Prior to Encounter  Medication Sig Dispense Refill   acetaminophen (TYLENOL) 325 MG tablet Take 1-2 tablets (325-650 mg total) by mouth every 6 (six) hours as needed for mild pain (pain score 1-3 or temp > 100.5).     albuterol (VENTOLIN HFA) 108 (90 Base) MCG/ACT inhaler Inhale 1 puff into the lungs every 6 (six) hours as needed for shortness of breath. 8 g 0   amLODipine (NORVASC) 2.5 MG tablet Take 1 tablet (2.5 mg total) by mouth daily. 30 tablet 0   atorvastatin (LIPITOR) 80 MG tablet Take 1 tablet (80 mg total) by mouth daily. 30 tablet 0   dexamethasone (DECADRON) 1 MG tablet Take 1 tablet (1 mg total) by mouth daily. 30 tablet 0   docusate sodium (COLACE) 100 MG capsule Take 1 capsule (100 mg total) by mouth daily. 10 capsule 0   gabapentin (NEURONTIN) 300 MG capsule Take 1 capsule (300 mg total) by mouth 3 (three) times daily. 90 capsule 0    HYDROcodone-acetaminophen (NORCO/VICODIN) 5-325 MG tablet Take 1-2 tablets by mouth every 4 (four) hours as needed for moderate pain (pain score 4-6). 30 tablet 0   levETIRAcetam (KEPPRA) 750 MG tablet Take 1 tablet (750 mg total) by mouth 2 (two) times daily. 60 tablet 0   magnesium gluconate (MAGONATE) 500 MG tablet Take 0.5 tablets (250 mg total) by mouth at bedtime. 30 tablet 0   nicotine (NICODERM CQ - DOSED IN MG/24 HOURS) 21 mg/24hr patch 21 mg patch daily x2 weeks then 14 mg patch daily x3 weeks then 7 mg patch daily x3 weeks and stop 28 patch 0   pantoprazole (PROTONIX) 40 MG tablet Take 1 tablet (40 mg total) by mouth daily. 30 tablet 0   polyethylene glycol (MIRALAX / GLYCOLAX) 17 g packet Take 17 g by mouth daily. 14 each 0   Vitamin D, Ergocalciferol, (DRISDOL) 1.25 MG (50000 UNIT) CAPS capsule Take 1 capsule (50,000 Units total) by mouth every 7 (seven) days. 5 capsule 0      ROS:  As per HPI. The patient does not endorse any pain complaints. He is aware that he is having a seizure. Unable to obtain comprehensive ROS due to speech deficit.    Blood pressure (!) 152/78, pulse 77, temperature 98.6 F (37 C), temperature source Oral, resp. rate 17, SpO2 98 %.   General Examination:                                                                                                       Physical Exam  HEENT-  Normocephalic. No acute bruising noted.   Lungs- Respirations unlabored Extremities- Left AKA noted. No edema.    Neurological Examination Mental Status: Awake with decreased level of alertness. Will answer questions with one-word dysarthric answers only. In this context he is oriented to day, month, year, city and state. He demonstrates awareness that he is in the hospital. Increased latencies of verbal and motor responses. Able to follow all  simple motor commands. No neglect in the context of left sided weakness.  Cranial Nerves: II: Visual fields are intact in all 4 quadrants OU. No extinction to DSS. PERRL.  III,IV, VI: No ptosis. EOM are full horizontally but has impaired upgaze. Saccadic quality of pursuits also noted. No nystagmus.  V: Temp sensation subjectively equal bilaterally. Decreased FT sensation to left side of face.  VII: Prominent left facial weakness in the context of intermittent rhythmic twitching of left lower quadrant of face and mouth.  VIII: Hearing intact to voice IX,X: Pharyngeal dysarthria XI: Weak SS on the left.  XII: Midline tongue extension Motor: RUE and RLE 5/5 proximally and distally.  LUE 4/5 proximally and distally with increased latencies of motor responses and occasional jerking movements.  LLE stump is 4+/5  Sensory: Subjectively normal temp sensation in BUE and BLE. Decreased FT sensation to LUE and LLE. Deep Tendon Reflexes: 2+ right brachioradialis and patellar. 3+ left brachioradialis. Right toe downgoing.  Cerebellar: No ataxia with right FNF. Dysmetric and bradykinetic with left FNF.  Gait: Unable to assess   Lab Results: Basic Metabolic Panel: Recent Labs  Lab 08/30/21 1617  NA 137  K 4.3  CL 103  GLUCOSE 110*  BUN 20  CREATININE 0.50*    CBC: Recent Labs  Lab 08/30/21 1608 08/30/21 1617  WBC 10.5  --   NEUTROABS 8.1*  --   HGB 12.6* 13.3  HCT 39.8 39.0  MCV 95.7  --   PLT 253  --     Cardiac Enzymes: No results for input(s): "CKTOTAL", "CKMB", "CKMBINDEX", "TROPONINI" in the last 168 hours.  Lipid Panel: No results for input(s): "CHOL", "TRIG", "HDL", "CHOLHDL", "VLDL", "LDLCALC" in the last 168 hours.  Imaging: CT HEAD CODE STROKE WO CONTRAST  Result Date: 08/30/2021 CLINICAL DATA:  Code stroke. Neuro deficit, acute, stroke suspected. Seizures. Facial twitching and left facial numbness. History of brain tumor. EXAM: CT HEAD WITHOUT CONTRAST TECHNIQUE:  Contiguous axial images were obtained from the base of the skull through the vertex without intravenous contrast. RADIATION DOSE REDUCTION: This exam was performed according to the departmental dose-optimization program  which includes automated exposure control, adjustment of the mA and/or kV according to patient size and/or use of iterative reconstruction technique. COMPARISON:  Head CT 08/14/2021 and MRI 07/14/2021 FINDINGS: Brain: Confluent hypodensity in the white matter of the posterior right frontal lobe has progressed from the prior CT with extension into the deep white matter tracts at the level of the basal ganglia and extension posteriorly into the right parietal lobe. This has the appearance of vasogenic edema, and there is regional sulcal effacement and mild mass effect on the right lateral ventricle without significant midline shift. A 7 mm hyperdense focus in the right frontal lobe at the site of a ring-enhancing mass on MRI is unchanged and consistent with calcification. A smaller region of edema in the left frontal lobe has also mildly increased. No acute intracranial hemorrhage, acute cortically based infarct, or significant extra-axial fluid collection is identified. Vascular: Calcified atherosclerosis at the skull base. No hyperdense vessel. Skull: Right-sided craniotomy. Sinuses/Orbits: Mild mucosal thickening in the paranasal sinuses. Small left mastoid effusion. Unremarkable orbits. Other: None. ASPECTS [facility] Stroke Program Early CT Score) - Ganglionic level infarction (caudate, lentiform nuclei, internal capsule, insula, M1-M3 cortex): 7 - Supraganglionic infarction (M4-M6 cortex): 3 Total score (0-10 with 10 being normal): 10 IMPRESSION: 1. No evidence of acute cortically based infarct or intracranial hemorrhage. 2. Increased edema in the right greater than left cerebral hemispheres. No significant midline shift. These results were communicated to Dr. Cheral Marker at 4:29 pm on 08/30/2021 by  text page via the [facility] messaging system. Electronically Signed   By: Logan Bores M.D.   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  - Exam reveals intermittent rhythmic left facial twitching that responds to IV Ativan doses but soon returns at lower amplitude. Also with left facial weakness and left arm worse than leg weakness in addition to left sided sensory deficit - CT head: Confluent hypodensity in the white matter of the posterior right frontal lobe has progressed from the prior CT with extension into the deep white matter tracts at the level of the basal ganglia and extension posteriorly into the right parietal lobe. This has the appearance of vasogenic edema, and there is regional sulcal effacement and mild mass effect on the right lateral ventricle without significant midline shift. A 7 mm hyperdense focus in the right frontal lobe at the site of a ring-enhancing mass on MRI is unchanged and consistent with calcification. A smaller region of edema in the left frontal lobe has also mildly increased.  Recommendations: - Loaded with Keppra 3000 mg IV x 1 - Continue with scheduled Keppra at an increased dose of 1500 mg IV BID.  - Administered a total of 4 IV doses of 2 mg Ativan in the ED with cessation of facial twitching but with onset of sedation.  - Switching from his current low-dose oral Decadron regimen to high dose IV: 10 mg IV x 1 then 4 mg IV q6h (ordered) - Seizure precautions - STAT LTM EEG has been ordered - Medical Oncology consult in the AM  60 minutes spent in the emergent neurological evaluation and management of this critically ill patient.   Addendum: - LTM EEG reveals right sided BIRDs and PLEDs  - Will need to add a second anticonvulsant. Loading with VPA 1500 mg IV followed by scheduled dosing  IV of 5 mg/kg TID.  - Repeated exam at 11:20 PM prior to VPA load reveals no facial or arm twitching. The patient is somnolent and continues to be dysarthric with one-word answers to questions.  - Have contacted Dr. Mickeal Skinner who will provide further input tomorrow.  - Will need to be admitted to the ICU under CCM.  - Immediate goal is resolution of epileptiform discharges. LTM EEG leads can then be removed for MRI brain with/without contrast to further characterize the interval increased edema seen on CT head.   Additional 25 minutes of critical care time.   Electronically signed: Dr. Kerney Elbe 08/30/2021, 4:55 PM

## 2021-08-30 NOTE — ED Notes (Signed)
Admittign MD at bedside

## 2021-08-30 NOTE — ED Notes (Signed)
Per Dr Cheral Marker q2 neuro checks

## 2021-08-30 NOTE — ED Notes (Signed)
Dr Cheral Marker at bedside

## 2021-08-30 NOTE — ED Notes (Signed)
Increased the O2 to 4L as pt's O2 dropped down to 85%.

## 2021-08-30 NOTE — ED Provider Notes (Signed)
[facility] EMERGENCY DEPARTMENT Provider Note   CSN: [id number] Arrival date & time: 08/30/21  1544     History  Chief Complaint  Patient presents with   Seizures    Roger Solis is a 66 y.o. male.   Seizures   66 year old male with medical history significant for metastatic lung cancer with mets to the brain who presents to the emergency department with concern for altered mental status and seizure.  The patient was evaluated by another ED provider in triage and a code stroke was initiated from triage due to concern for possible intracranial hemorrhage with worsening patient mental status and concern for active seizure.  IV access was obtained and the patient was administered 2 mg of IM Ativan.  His airway was cleared and he was taken to the CT scanner for emergent code stroke imaging.  Neurology was bedside for evaluation on patient arrival. CBG 101.  Home Medications Prior to Admission medications   Medication Sig Start Date End Date Taking? Authorizing Provider  acetaminophen (TYLENOL) 325 MG tablet Take 1-2 tablets (325-650 mg total) by mouth every 6 (six) hours as needed for mild pain (pain score 1-3 or temp > 100.5). 08/18/21   Angiulli, Lavon Paganini, PA-C  albuterol (VENTOLIN HFA) 108 (90 Base) MCG/ACT inhaler Inhale 1 puff into the lungs every 6 (six) hours as needed for shortness of breath. 08/18/21   Angiulli, Lavon Paganini, PA-C  amLODipine (NORVASC) 2.5 MG tablet Take 1 tablet (2.5 mg total) by mouth daily. 08/18/21   Angiulli, Lavon Paganini, PA-C  atorvastatin (LIPITOR) 80 MG tablet Take 1 tablet (80 mg total) by mouth daily. 08/18/21   Angiulli, Lavon Paganini, PA-C  dexamethasone (DECADRON) 1 MG tablet Take 1 tablet (1 mg total) by mouth daily. 08/18/21   Angiulli, Lavon Paganini, PA-C  docusate sodium (COLACE) 100 MG capsule Take 1 capsule (100 mg total) by mouth daily. 08/18/21   Angiulli, Lavon Paganini, PA-C  gabapentin (NEURONTIN) 300 MG capsule Take 1 capsule (300 mg total) by  mouth 3 (three) times daily. 08/18/21   Angiulli, Lavon Paganini, PA-C  HYDROcodone-acetaminophen (NORCO/VICODIN) 5-325 MG tablet Take 1-2 tablets by mouth every 4 (four) hours as needed for moderate pain (pain score 4-6). 08/18/21   Angiulli, Lavon Paganini, PA-C  levETIRAcetam (KEPPRA) 750 MG tablet Take 1 tablet (750 mg total) by mouth 2 (two) times daily. 08/18/21   Angiulli, Lavon Paganini, PA-C  magnesium gluconate (MAGONATE) 500 MG tablet Take 0.5 tablets (250 mg total) by mouth at bedtime. 08/18/21   Angiulli, Lavon Paganini, PA-C  nicotine (NICODERM CQ - DOSED IN MG/24 HOURS) 21 mg/24hr patch 21 mg patch daily x2 weeks then 14 mg patch daily x3 weeks then 7 mg patch daily x3 weeks and stop 08/18/21   Angiulli, Lavon Paganini, PA-C  pantoprazole (PROTONIX) 40 MG tablet Take 1 tablet (40 mg total) by mouth daily. 08/18/21   Angiulli, Lavon Paganini, PA-C  polyethylene glycol (MIRALAX / GLYCOLAX) 17 g packet Take 17 g by mouth daily. 08/18/21   Angiulli, Lavon Paganini, PA-C  Vitamin D, Ergocalciferol, (DRISDOL) 1.25 MG (50000 UNIT) CAPS capsule Take 1 capsule (50,000 Units total) by mouth every 7 (seven) days. 08/24/21   Angiulli, Lavon Paganini, PA-C      Allergies    Patient has no known allergies.    Review of Systems   Review of Systems  Unable to perform ROS: Acuity of condition  Neurological:  Positive for seizures.    Physical Exam Updated Vital  Signs BP 127/78   Pulse 68   Temp 97.9 F (36.6 C) (Axillary)   Resp 15   SpO2 99%  Physical Exam Vitals and nursing note reviewed.  Constitutional:      Comments: Ongoing facial twitching present  HENT:     Head: Normocephalic and atraumatic.  Eyes:     Conjunctiva/sclera: Conjunctivae normal.     Pupils: Pupils are equal, round, and reactive to light.  Cardiovascular:     Rate and Rhythm: Normal rate and regular rhythm.  Pulmonary:     Effort: Pulmonary effort is normal. No respiratory distress.  Abdominal:     General: There is no distension.     Tenderness: There is no  guarding.  Musculoskeletal:        General: No deformity or signs of injury.     Cervical back: Neck supple.  Skin:    Findings: No lesion or rash.  Neurological:     Comments: Decreased level of alertness, facial twitching, dysarthria present, left hemibody weakness present     ED Results / Procedures / Treatments   Labs (all labs ordered are listed, but only abnormal results are displayed) Labs Reviewed  CBC - Abnormal; Notable for the following components:      Result Value   RBC 4.16 (*)    Hemoglobin 12.6 (*)    RDW 15.9 (*)    All other components within normal limits  DIFFERENTIAL - Abnormal; Notable for the following components:   Neutro Abs 8.1 (*)    Abs Immature Granulocytes 0.09 (*)    All other components within normal limits  COMPREHENSIVE METABOLIC PANEL - Abnormal; Notable for the following components:   Glucose, Bld 111 (*)    Albumin 3.1 (*)    All other components within normal limits  RAPID URINE DRUG SCREEN, HOSP PERFORMED - Abnormal; Notable for the following components:   Opiates POSITIVE (*)    All other components within normal limits  CBG MONITORING, ED - Abnormal; Notable for the following components:   Glucose-Capillary 109 (*)    All other components within normal limits  CBG MONITORING, ED - Abnormal; Notable for the following components:   Glucose-Capillary 101 (*)    All other components within normal limits  I-STAT CHEM 8, ED - Abnormal; Notable for the following components:   Creatinine, Ser 0.50 (*)    Glucose, Bld 110 (*)    Calcium, Ion 1.03 (*)    All other components within normal limits  RESP PANEL BY RT-PCR (FLU A&B, COVID) ARPGX2  ETHANOL  PROTIME-INR  APTT  URINALYSIS, ROUTINE W REFLEX MICROSCOPIC  LEVETIRACETAM LEVEL  CBC  BASIC METABOLIC PANEL  MAGNESIUM  PHOSPHORUS    EKG EKG Interpretation  Date/Time:  Wednesday August 30 2021 15:54:44 EDT Ventricular Rate:  87 PR Interval:  148 QRS Duration: 82 QT  Interval:  374 QTC Calculation: 450 R Axis:   75 Text Interpretation: Nonspecific ST abnormality Abnormal ECG When compared with ECG of 17-Jun-2021 13:45, PREVIOUS ECG IS PRESENT Confirmed by Regan Lemming ([id number]) on 08/30/2021 4:51:15 PM  Radiology DG CHEST PORT 1 VIEW  Result Date: 08/30/2021 CLINICAL DATA:  Hypoxemia. EXAM: PORTABLE CHEST 1 VIEW COMPARISON:  Portable chest 05/19/2021. FINDINGS: There are intact sternotomy sutures with again noted CABG change. There is mild cardiomegaly versus exaggerated heart size due to low inspiration. The mediastinal configuration is stable with aortic tortuosity and calcification. The hypoinflated lungs demonstrate asymmetric streaky left lower lobe retrocardiac opacity which could be  atelectasis or pneumonia. There are atelectatic bands in the right base. No pleural effusion is seen. Remaining lungs generally clear. There are degenerative changes of the spine. IMPRESSION: 1. Asymmetric opacity in the left lower lobe which could be atelectasis or pneumonia. 2. Low lung volumes with either exaggerated cardiac size due to hypoinflation or mild cardiomegaly. No vascular congestion findings. 3. Aortic atherosclerosis. Electronically Signed   By: Telford Nab M.D.   On: 08/30/2021 23:32   CT HEAD CODE STROKE WO CONTRAST  Result Date: 08/30/2021 CLINICAL DATA:  Code stroke. Neuro deficit, acute, stroke suspected. Seizures. Facial twitching and left facial numbness. History of brain tumor. EXAM: CT HEAD WITHOUT CONTRAST TECHNIQUE: Contiguous axial images were obtained from the base of the skull through the vertex without intravenous contrast. RADIATION DOSE REDUCTION: This exam was performed according to the departmental dose-optimization program which includes automated exposure control, adjustment of the mA and/or kV according to patient size and/or use of iterative reconstruction technique. COMPARISON:  Head CT 08/14/2021 and MRI 07/14/2021 FINDINGS: Brain: Confluent  hypodensity in the white matter of the posterior right frontal lobe has progressed from the prior CT with extension into the deep white matter tracts at the level of the basal ganglia and extension posteriorly into the right parietal lobe. This has the appearance of vasogenic edema, and there is regional sulcal effacement and mild mass effect on the right lateral ventricle without significant midline shift. A 7 mm hyperdense focus in the right frontal lobe at the site of a ring-enhancing mass on MRI is unchanged and consistent with calcification. A smaller region of edema in the left frontal lobe has also mildly increased. No acute intracranial hemorrhage, acute cortically based infarct, or significant extra-axial fluid collection is identified. Vascular: Calcified atherosclerosis at the skull base. No hyperdense vessel. Skull: Right-sided craniotomy. Sinuses/Orbits: Mild mucosal thickening in the paranasal sinuses. Small left mastoid effusion. Unremarkable orbits. Other: None. ASPECTS [facility] Stroke Program Early CT Score) - Ganglionic level infarction (caudate, lentiform nuclei, internal capsule, insula, M1-M3 cortex): 7 - Supraganglionic infarction (M4-M6 cortex): 3 Total score (0-10 with 10 being normal): 10 IMPRESSION: 1. No evidence of acute cortically based infarct or intracranial hemorrhage. 2. Increased edema in the right greater than left cerebral hemispheres. No significant midline shift. These results were communicated to Dr. Cheral Marker at 4:29 pm on 08/30/2021 by text page via the [facility] messaging system. Electronically Signed   By: Logan Bores M.D.   On: 08/30/2021 16:29    Procedures .Critical Care  Performed by: Regan Lemming, MD Authorized by: Regan Lemming, MD   Critical care provider statement:    Critical care time (minutes):  30   Critical care was necessary to treat or prevent imminent or life-threatening deterioration of the following conditions:  CNS failure or compromise   Critical  care was time spent personally by me on the following activities:  Development of treatment plan with patient or surrogate, discussions with consultants, evaluation of patient's response to treatment, examination of patient, ordering and review of laboratory studies, ordering and review of radiographic studies, ordering and performing treatments and interventions, pulse oximetry, re-evaluation of patient's condition and review of old charts   Care discussed with: admitting provider       Medications Ordered in ED Medications  dexamethasone (DECADRON) injection 10 mg (has no administration in time range)  dexamethasone (DECADRON) injection 4 mg (has no administration in time range)  levETIRAcetam (KEPPRA) IVPB 1500 mg/ 100 mL premix (has no administration in time  range)  heparin injection 5,000 Units (has no administration in time range)  lactated ringers infusion (has no administration in time range)  docusate sodium (COLACE) capsule 100 mg (has no administration in time range)  polyethylene glycol (MIRALAX / GLYCOLAX) packet 17 g (has no administration in time range)  valproate (DEPACON) 1,500 mg in dextrose 5 % 50 mL IVPB (has no administration in time range)  valproate (DEPACON) 500 mg in dextrose 5 % 50 mL IVPB (has no administration in time range)  hydrALAZINE (APRESOLINE) injection 10-40 mg (has no administration in time range)  LORazepam (ATIVAN) injection 2 mg (2 mg Intravenous Not Given 08/30/21 1624)  levETIRAcetam (KEPPRA) IVPB 1000 mg/100 mL premix (0 mg Intravenous Stopped 08/30/21 1706)  LORazepam (ATIVAN) injection 2 mg (2 mg Intramuscular Given 08/30/21 1555)  LORazepam (ATIVAN) injection 2 mg (2 mg Intravenous Given 08/30/21 1740)    ED Course/ Medical Decision Making/ A&P                           Medical Decision Making Risk Prescription drug management. Decision regarding hospitalization.   66 year old male with medical history significant for metastatic lung cancer  with mets to the brain who presents to the emergency department with concern for altered mental status and seizure.   Last known normal at 1400. The patient was evaluated by another ED provider in triage and a code stroke was initiated from triage due to concern for possible intracranial hemorrhage with worsening patient mental status and concern for active seizure.  IV access was obtained and the patient was administered 2 mg of IM Ativan.  His airway was cleared and he was taken to the CT scanner for emergent code stroke imaging.  Neurology was bedside for evaluation on patient arrival. CBG 101.  CT code stroke imaging was performed which revealed evidence of vasogenic edema with no midline shift.  No clear evidence for infarct or hemorrhage. IMPRESSION:  1. No evidence of acute cortically based infarct or intracranial  hemorrhage.  2. Increased edema in the right greater than left cerebral  hemispheres. No significant midline shift.    The patient was administered an additional 4 mg of IV Ativan and loaded with IV Keppra.  Stat EEG monitoring was performed and ordered by neurology concerning for focal status epilepticus. He continued to protect his airway.   Laboratory evaluation significant for COVID-19 influenza PCR testing negative, UDS positive for opiates, urinalysis negative for UTI, CBC with no leukocytosis, mild anemia 12.6, CMP without significant electrolyte abnormality, CBG is previously reported 101 on arrival, ethanol level normal.  Following evaluation, neurology recommended admission to PCCM for further care and monitoring and continuous LTM given the patient's ongoing focal status in the setting of brain mets and vasogenic edema.  10 mg of IV Decadron was administered.  The patient received a total of 8 mg of Ativan.  Dr. Patsey Berthold of critical care was consulted for admission.  The patient was subsequently admitted in critical condition.  Final Clinical Impression(s) / ED  Diagnoses Final diagnoses:  Seizure ([place])  Status epilepticus ([place])  Vasogenic edema ([place])  Metastatic cancer to brain [facility]    Rx / DC Orders ED Discharge Orders     None         Regan Lemming, MD 08/30/21 2337

## 2021-08-30 NOTE — ED Triage Notes (Signed)
Pt here with cluster seizures. New L sided facial paralysis onset approx 1530 today. Pt with hx brain tumor.

## 2021-08-30 NOTE — H&P (Signed)
NAME:  Roger Solis, MRN:  [id number], DOB:  1955/09/09, LOS: 0 ADMISSION DATE:  08/30/2021, CONSULTATION DATE:  08/30/21 REFERRING MD:  Cheral Marker CHIEF COMPLAINT:  Focal Status Epilepticus   History of Present Illness:  Roger Solis is a 66 y.o. 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  He presented to [facility] ED 6/28 with complex partial status epilepticus involving the left face and LUE. He apparently developed left "facial swelling" earlier that morning but was otherwise normal. LKN 1400 and at 82, daughter found him to be choking and experiencing dysphagia.  CT head was neg for infarct or hemorrhage. He was evaluated by neurology who deemed him to have focal status epilepticus. He was loaded with 3g Keppr and received a total of 8mg  IV Ativan. He was then connected to cEEG and PCCM was asked to admit to the ICU for ongoing EEG monitoring.  At the time of my evaluation, he is somnolent with decreased LOC; however, he does have intact gag and cough/is protecting his airway.  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  Significant Hospital Events: Including  procedures, antibiotic start and stop dates in addition to other pertinent events   6/28 admit.  Interim History / Subjective:  Sedated after 8mg  Ativan and loaded with Keppra. Vitals stable. cEEG currently in process.  Objective:  Blood pressure 127/78, pulse 68, temperature 97.9 F (36.6 C), temperature source Axillary, resp. rate 15, SpO2 99 %.       No intake or output data in the 24 hours ending 08/30/21 2323 There were no vitals filed for this visit.  Examination: General: Adult male, resting in bed, in NAD. Neuro: Somnolent, does not respond to questions or follow any commands. Did receive 8mg  Ativan and 3g Keppra. HEENT: [place]/AT. Sclerae anicteric. PEERL. Cardiovascular: RRR, no M/R/G.  Lungs: Respirations even and unlabored.  CTA bilaterally, No W/R/R. Abdomen: BS x 4, soft, NT/ND.  Musculoskeletal: L BKA no edema.  Skin: Intact, warm, no rashes.  Labs/imaging personally reviewed:  CT head 6/28 > no infarct or hemorrhage, increased edema R > L cerebral hemispheres.   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 Hx radiation therapy induced brain necrosis, CVA. - Neurology following / managing. - AED's per neuro. - Continue Decadron per neuro (dose increased). - cEEG ongoing.  At risk for airway compromise after multiple doses Ativan for seizures - currently protecting airway despite being somnolent and not responsive. - Close ICU monitoring.  Hx squamous cell carcinoma of the lung with mets to brain - s/p ? Partial resection + chemo + radiation + immunotherapy with course complicated by post radiation partial brain necrosis (followed by Dr. Mickeal Skinner). - Continue Decadron  per neuro.  - Medical oncology consult per neuro. - F/u as outpatient.  Hx CAD, HTN, HLD. - Hydralazine PRN. - Hold home Amlodipine, Atorvastatin.  Hx PAD s/p recent L BKA (08/06/21). - Supportive care.  Goals  of Care:  Attempted to call daughter, Ishmael Holter to clarify goals of care but unfortunately no answer. In light of his underlying medical conditions, would discuss consideration of continued supportive care with DNR limitations in place in the unfortunate event of cardiac arrest.   Best practice (evaluated daily):  Diet/type: NPO DVT prophylaxis: prophylactic heparin  GI prophylaxis: N/A Lines: N/A Foley:  N/A Code Status:  full code - recommend ongoing goals of care discussions with family.  Attempted to call daughter Ishmael Holter at 2300 on 08/30/21 but no answer. Last date of multidisciplinary goals of care discussion: None yet.  Labs   CBC: Recent Labs  Lab 08/30/21 1608 08/30/21 1617  WBC 10.5  --   NEUTROABS 8.1*  --   HGB 12.6* 13.3  HCT 39.8 39.0  MCV 95.7  --   PLT 253  --     Basic Metabolic Panel: Recent Labs  Lab 08/30/21 1608 08/30/21 1617  NA 137 137  K 4.4 4.3  CL 104 103  CO2 22  --   GLUCOSE 111* 110*  BUN 17 20  CREATININE 0.69 0.50*  CALCIUM 9.3  --    GFR: CrCl cannot be calculated (Unknown ideal weight.). Recent Labs  Lab 08/30/21 1608  WBC 10.5    Liver Function Tests: Recent Labs  Lab 08/30/21 1608  AST 24  ALT 23  ALKPHOS 66  BILITOT 0.4  PROT 6.6  ALBUMIN 3.1*   No results for input(s): "LIPASE", "AMYLASE" in the last 168 hours. No results for input(s): "AMMONIA" in the last 168 hours.  ABG    Component Value Date/Time   TCO2 24 08/30/2021 1617     Coagulation Profile: Recent Labs  Lab 08/30/21 1608  INR 1.0    Cardiac Enzymes: No results for input(s): "CKTOTAL", "CKMB", "CKMBINDEX", "TROPONINI" in the last 168 hours.  HbA1C: No results found for: "HGBA1C"  CBG: Recent Labs  Lab 08/30/21 1551 08/30/21 1734  GLUCAP 109* 101*    Review of Systems:   Unable to obtain as pt is encephalopathic.  Past Medical History:  He,  has a past medical history of CAD (coronary artery disease), History of brain cancer, HLD  (hyperlipidemia), Hypertension, and PAD (peripheral artery disease) ([place]).   Surgical History:   Past Surgical History:  Procedure Laterality Date   AMPUTATION Left 08/06/2021   Procedure: LEFTAMPUTATION ABOVE KNEE;  Surgeon: Angelia Mould, MD;  Location: [employer];  Service: Vascular;  Laterality: Left;   APPLICATION OF CRANIAL NAVIGATION N/A 05/23/2021   Procedure: APPLICATION OF CRANIAL NAVIGATION;  Surgeon: Kristeen Miss, MD;  Location: [place];  Service: Neurosurgery;  Laterality: N/A;   double bypass  2012   FRAMELESS  BIOPSY WITH BRAINLAB N/A 05/23/2021   Procedure: Stereotactic needle biopsy of the brain w/brainlab;  Surgeon: Kristeen Miss, MD;  Location: [place];  Service: Neurosurgery;  Laterality: N/A;     Social History:   reports that he has been smoking cigarettes. He has been smoking an average of 1 pack per day. He has never used smokeless tobacco. He reports current alcohol use. He reports that he does not use drugs.   Family History:  His family history includes Heart disease in his father and mother.   Allergies No Known Allergies  Home Medications  Prior to Admission medications   Medication Sig Start Date End Date Taking? Authorizing Provider  acetaminophen (TYLENOL) 325 MG tablet Take 1-2 tablets (325-650 mg total) by mouth every 6 (six) hours as needed for mild pain (pain score 1-3 or temp > 100.5). 08/18/21   Angiulli, Lavon Paganini, PA-C  albuterol (VENTOLIN HFA) 108 (90 Base) MCG/ACT inhaler Inhale 1 puff into the lungs every 6 (six) hours as needed for shortness of breath. 08/18/21   Angiulli, Lavon Paganini, PA-C  amLODipine (NORVASC) 2.5 MG tablet Take 1 tablet (2.5 mg total) by mouth daily. 08/18/21   Angiulli, Lavon Paganini, PA-C  atorvastatin (LIPITOR) 80 MG tablet Take 1 tablet (80 mg total) by mouth daily. 08/18/21   Angiulli, Lavon Paganini, PA-C  dexamethasone (DECADRON) 1 MG tablet Take 1 tablet (1 mg total) by mouth daily. 08/18/21   Angiulli, Lavon Paganini, PA-C  docusate  sodium (COLACE) 100 MG capsule Take 1 capsule (100 mg total) by mouth daily. 08/18/21   Angiulli, Lavon Paganini, PA-C  gabapentin (NEURONTIN) 300 MG capsule Take 1 capsule (300 mg total) by mouth 3 (three) times daily. 08/18/21   Angiulli, Lavon Paganini, PA-C  HYDROcodone-acetaminophen (NORCO/VICODIN) 5-325 MG tablet Take 1-2 tablets by mouth every 4 (four) hours as needed for moderate pain (pain score 4-6). 08/18/21   Angiulli, Lavon Paganini, PA-C  levETIRAcetam (KEPPRA) 750 MG tablet Take 1 tablet (750 mg total) by mouth 2 (two) times daily. 08/18/21   Angiulli, Lavon Paganini, PA-C  magnesium gluconate (MAGONATE) 500 MG tablet Take 0.5 tablets (250 mg total) by mouth at bedtime. 08/18/21   Angiulli, Lavon Paganini, PA-C  nicotine (NICODERM CQ - DOSED IN MG/24 HOURS) 21 mg/24hr patch 21 mg patch daily x2 weeks then 14 mg patch daily x3 weeks then 7 mg patch daily x3 weeks and stop 08/18/21   Angiulli, Lavon Paganini, PA-C  pantoprazole (PROTONIX) 40 MG tablet Take 1 tablet (40 mg total) by mouth daily. 08/18/21   Angiulli, Lavon Paganini, PA-C  polyethylene glycol (MIRALAX / GLYCOLAX) 17 g packet Take 17 g by mouth daily. 08/18/21   Angiulli, Lavon Paganini, PA-C  Vitamin D, Ergocalciferol, (DRISDOL) 1.25 MG (50000 UNIT) CAPS capsule Take 1 capsule (50,000 Units total) by mouth every 7 (seven) days. 08/24/21   Angiulli, Lavon Paganini, PA-C     Critical care time: 40 min.   Montey Hora, [facility] For pager details, please see AMION or use Epic chat  After 1900, please call [facility] for cross coverage needs 08/30/2021, 11:23 PM

## 2021-08-30 NOTE — Code Documentation (Signed)
Stroke Response Nurse Documentation Code Documentation  Roger Solis is a 66 y.o. 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 On No antithrombotic. On keppra 732m BID.   Patient from home where he developed left sided facial swelling this morning. LKW 1400 per daughter. AT 1530 she noted he was chocking. He stated he was having difficulty swallowing. She noted he was unable to move the left side of his mouth. Soon after he had facial and arm twitching. He did not want to go to the hospital, but she drove him to the ED. 259mativan giving in triage. Code stroke was activated by ED.   Stroke team met patient on way from triage to CT. NIHSS 3, see documentation for details and code stroke times. Patient with left facial droop and dysarthria  on exam. The following imaging was completed:  CT Head. Left facial twitching noted. 2 mg ativan given. Patient is not a candidate for IV Thrombolytic due to stroke not suspected. Code stroke canceled.  Care Plan: Q15 VS and neuro assessments and stat EEG.   Bedside handoff with ED RN Chloe.    TaLeverne Humblestroke Response RN

## 2021-08-31 ENCOUNTER — Inpatient Hospital Stay (HOSPITAL_COMMUNITY): Payer: No Typology Code available for payment source

## 2021-08-31 DIAGNOSIS — G40901 Epilepsy, unspecified, not intractable, with status epilepticus: Secondary | ICD-10-CM | POA: Diagnosis not present

## 2021-08-31 LAB — CBC
HCT: 38.5 % — ABNORMAL LOW (ref 39.0–52.0)
Hemoglobin: 12.2 g/dL — ABNORMAL LOW (ref 13.0–17.0)
MCH: 29.3 pg (ref 26.0–34.0)
MCHC: 31.7 g/dL (ref 30.0–36.0)
MCV: 92.5 fL (ref 80.0–100.0)
Platelets: 233 10*3/uL (ref 150–400)
RBC: 4.16 MIL/uL — ABNORMAL LOW (ref 4.22–5.81)
RDW: 15.9 % — ABNORMAL HIGH (ref 11.5–15.5)
WBC: 13.2 10*3/uL — ABNORMAL HIGH (ref 4.0–10.5)
nRBC: 0 % (ref 0.0–0.2)

## 2021-08-31 LAB — BASIC METABOLIC PANEL
Anion gap: 12 (ref 5–15)
BUN: 15 mg/dL (ref 8–23)
CO2: 24 mmol/L (ref 22–32)
Calcium: 9.5 mg/dL (ref 8.9–10.3)
Chloride: 103 mmol/L (ref 98–111)
Creatinine, Ser: 0.59 mg/dL — ABNORMAL LOW (ref 0.61–1.24)
GFR, Estimated: >60 mL/min (ref >60)
Glucose, Bld: 116 mg/dL — ABNORMAL HIGH (ref 70–99)
Potassium: 4.1 mmol/L (ref 3.5–5.1)
Sodium: 139 mmol/L (ref 135–145)

## 2021-08-31 LAB — MRSA NEXT GEN BY PCR, NASAL: MRSA by PCR Next Gen: NOT DETECTED

## 2021-08-31 LAB — PHOSPHORUS: Phosphorus: 3.5 mg/dL (ref 2.5–4.6)

## 2021-08-31 LAB — MAGNESIUM: Magnesium: 1.8 mg/dL (ref 1.7–2.4)

## 2021-08-31 LAB — LEVETIRACETAM LEVEL: Levetiracetam Lvl: 17.2 ug/mL (ref 10.0–40.0)

## 2021-08-31 MED ORDER — MAGNESIUM SULFATE 2 GM/50ML IV SOLN
2.0000 g | Freq: Once | INTRAVENOUS | Status: AC
  Administered 2021-08-31: 2 g via INTRAVENOUS
  Filled 2021-08-31: qty 50

## 2021-08-31 MED ORDER — NICOTINE 21 MG/24HR TD PT24
21.0000 mg | MEDICATED_PATCH | Freq: Every day | TRANSDERMAL | Status: DC
  Administered 2021-08-31 – 2021-09-01 (×2): 21 mg via TRANSDERMAL
  Filled 2021-08-31 (×2): qty 1

## 2021-08-31 MED ORDER — CHLORHEXIDINE GLUCONATE CLOTH 2 % EX PADS
6.0000 | MEDICATED_PAD | Freq: Every day | CUTANEOUS | Status: DC
  Administered 2021-08-31 – 2021-09-01 (×3): 6 via TOPICAL

## 2021-08-31 MED ORDER — LORAZEPAM 2 MG/ML IJ SOLN: 2.0000 mg | INTRAMUSCULAR | Status: DC | PRN

## 2021-08-31 MED ORDER — GADOBUTROL 1 MMOL/ML IV SOLN
7.0000 mL | Freq: Once | INTRAVENOUS | Status: AC | PRN
Start: 2021-08-31 — End: 2021-08-31
  Administered 2021-08-31: 7 mL via INTRAVENOUS

## 2021-08-31 MED ORDER — POLYETHYLENE GLYCOL 3350 17 G PO PACK
17.0000 g | PACK | Freq: Every day | ORAL | Status: DC
  Administered 2021-08-31 – 2021-09-01 (×2): 17 g via ORAL
  Filled 2021-08-31 (×2): qty 1

## 2021-08-31 NOTE — Progress Notes (Signed)
LTM EEG discontinued - no skin breakdown at unhook.   

## 2021-08-31 NOTE — Progress Notes (Signed)
Patient is covered by [facility]; hospital admission reported to the [facility] by admitting department.   [place] Reference Number: [id number].    Transition of Care [facility]) Screening Note   Patient Details  Name: Roger Solis Date of Birth: January 15, 1956     Transition of Care Department [facility]) has reviewed patient and no TOC needs have been identified at this time. We will continue to monitor patient advancement through interdisciplinary progression rounds. If new patient transition needs arise, please place a TOC consult.    Reinaldo Raddle, RN, BSN  Trauma/Neuro ICU Case Manager [phone]

## 2021-08-31 NOTE — Procedures (Addendum)
Patient Name: Roger Solis  MRN: [id number]  Epilepsy Attending: [name]  Referring Physician/Provider: Kerney Elbe, MD  Duration: 08/30/2021 1930 to 08/31/2021 1345  Patient history: 66 year old male with metastatic squamous cell carcinoma of lung with brain mets diagnosed in February of 2022 and focal seizures who presents with complex partial status epilepticus. EEG to evaluate for seizure.    Level of alertness: Lethargic-->awake, asleep  AEDs during EEG study: LEV, VPA  Technical aspects: This EEG study was done with scalp electrodes positioned according to the 10-20 International system of electrode placement. Electrical activity was acquired at a sampling rate of 500Hz  and reviewed with a high frequency filter of 70Hz  and a low frequency filter of 1Hz . EEG data were recorded continuously and digitally stored.   Description: At the beginning of the study, EEG showed sharply contoured 6 to 8 Hz theta and alpha activity which gradually evolved in morphology and frequency consistent with focal seizure.  No clinical signs were seen during this. Average 5 seizures were noted per hour lasting about 40 seconds each. After around [place] on 08/31/2021, as medications were adjusted, frequency of seizure improved.  Last seizure was noted at 0524.  EEG then continued to show brief ictal interictal rhythmic discharges in right temporal region lasting 2 to 5 seconds as well as lateralized periodic discharges with overriding fast activity in right hemisphere, maximal right temporal region at 0.5Hz .EEG also showed continuous generalized and lateralized right hemisphere 3 to 6 Hz theta-delta slowing admixed with 12 to 14 Hz generalized beta activity intermittently.   Gradually EEG improved and showed posterior dominant rhythm of 8 Hz activity of moderate voltage (25-35 uV) seen predominantly in posterior head regions, symmetric and reactive to eye opening and eye closing. Sleep was characterized by  vertex waves, sleep spindles (12 to 14 Hz), maximal frontocentral region. There was also continuous 3-5hz  theta-delta slowing in right temporal region.  Hyperventilation and photic stimulation were not performed.     ABNORMALITY -Focal electrographic seizure, right temporal region -Brief ictal-interictal rhythmic discharges, right temporal region -Lateralized periodic discharges with overriding fast activity, right hemisphere, maximal right temporal region -Continuous slow, generalized and lateralized right hemisphere  IMPRESSION: At the beginning of the study, patient was noted to have focal electrographic seizures arising from right temporal region, average 5 seizure per hour lasting about 40 seconds each.  After around [place] on 08/31/2021, frequency of seizures improved.  Last seizure was noted on 08/31/2021 at 0524.  EEG also showed evidence of epileptogenicity and cortical dysfunction likely due to underlying structural abnormality and with high potential for seizure recurrence arising from right hemisphere, maximal right temporal region.   Additionally there was moderate diffuse encephalopathy, nonspecific etiology but most likely related to seizure.   Gabryela Kimbrell Barbra Sarks

## 2021-08-31 NOTE — Progress Notes (Signed)
Pt's daughter reports that pt had not had BM since 6/18. PRN Miralax given and MD notified for scheduled bowel regimen.

## 2021-08-31 NOTE — Progress Notes (Signed)
NAME:  Roger Solis, MRN:  [id number], DOB:  01-Mar-1956, LOS: 1 ADMISSION DATE:  08/30/2021, CONSULTATION DATE:  08/30/21 REFERRING MD:  Cheral Marker CHIEF COMPLAINT:  Focal Status Epilepticus   History of Present Illness:  Roger Solis is a 66 y.o. 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  He presented to [facility] ED 6/28 with complex partial status epilepticus involving the left face and LUE. He apparently developed left "facial swelling" earlier that morning but was otherwise normal. LKN 1400 and at 63, daughter found him to be choking and experiencing dysphagia.  CT head was neg for infarct or hemorrhage. He was evaluated by neurology who deemed him to have focal status epilepticus. He was loaded with 3g Keppra and received a total of 8mg  IV Ativan. He was then connected to cEEG and PCCM was asked to admit to the ICU for ongoing EEG monitoring.  At the time of my evaluation, he is somnolent with decreased LOC; however, he does have intact gag and cough/is protecting his airway.  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  Significant Hospital Events: Including procedures, antibiotic start and stop dates in addition to other pertinent events   6/28 admit with focal seizures  CT head > no infarct or hemorrhage, increased edema R > L cerebral hemispheres. 6/29 no further seizures overnight, daughter at bedside this am. EEG remains in place   Interim History / Subjective:  Alert and oriented x3, slight right side weakness with right facial droop  Objective:  Blood pressure (!) 142/76, pulse 77, temperature 97.8 F (36.6 C), temperature source Oral, resp. rate 16, weight 71.9 kg, SpO2 96 %.        Intake/Output Summary (Last 24 hours) at 08/31/2021 4193 Last data filed at 08/31/2021 0600 Gross per 24 hour  Intake 318.49 ml  Output 450 ml  Net -131.51 ml   Filed Weights   08/31/21 0052  Weight: 71.9 kg    Examination: General: Acute on chronically ill appearing middle aged male sitting up in bed in NAD HEENT: [place]/AT, MM pink/moist, PERRL,  Neuro: Alert and oriented x3, right sided weakness 3/5 strength with right facial droop and slurred speech  CV: s1s2 regular rate and rhythm, no murmur, rubs, or gallops,  PULM:  Clear to ascultation, no increased work of breathing, no added breath sounds  GI: soft, bowel sounds active in all 4 quadrants, non-tender, non-distended Extremities: warm/dry, no edema  Skin: no rashes or lesions  Resolved problems:    Assessment & Plan:   Focal status epilepticus with underlying hx of seizures and brain mets - s/p 8mg  Ativan and 3g Keppra load. Hx radiation therapy induced  brain necrosis, CVA. P: Primary management per neurology  Continuous EEG remains in place  AEDs per neurology  Decadron per neuro  Frequent neuro exams  Further images including MRI per neuro    At risk for airway compromise  -In the setting of multiple doses Ativan for seizures -  currently protecting airway despite being somnolent and not responsive. P: Continue to monitor closely in the ICU setting Head of bed elevated 30 degrees  Follow intermittent chest x-ray and ABG  Ensure adequate pulmonary hygiene  Aspiration precautions   Hx squamous cell carcinoma of the lung with mets to brain  - s/p ? Partial resection + chemo + radiation + immunotherapy with course complicated by post radiation partial brain necrosis (followed by Dr. Mickeal Skinner). P: Decadron as above  Contacted Primary oncologist to inform them of admission  Follow up as an outpatient   Hx CAD, HTN, HLD P: PRN antihypertensives Continue to hold home Amlodipine and Atorvastatin   Hx PAD s/p recent L BKA (08/06/21) P: Supportive care  Resume Gabapentin when able to take orals   Best practice (evaluated daily):  Diet/type: NPO DVT prophylaxis: prophylactic heparin  GI prophylaxis: N/A Lines: N/A Foley:  N/A Code Status:  full code - recommend ongoing goals of care discussions with family. Daughter and patient updated at bedside this am   Critical care time: NA  Darden Flemister D. Kenton Kingfisher, NP-C [facility] Personal contact information can be found on Amion  08/31/2021, 7:19 AM

## 2021-08-31 NOTE — Progress Notes (Signed)
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  eICU Interventions  New patient Evaluation.        Kerry Kass Obadiah Dennard 08/31/2021, 2:16 AM

## 2021-08-31 NOTE — Evaluation (Signed)
Clinical/Bedside Swallow Evaluation Patient Details  Name: Roger Solis MRN: [id number] Date of Birth: 1955-12-15  Today's Date: 08/31/2021 Time: SLP Start Time (ACUTE ONLY): 1203 SLP Stop Time (ACUTE ONLY): 1225 SLP Time Calculation (min) (ACUTE ONLY): 22 min  Past Medical History:  Past Medical History:  Diagnosis Date   CAD (coronary artery disease)    History of brain cancer    HLD (hyperlipidemia)    Hypertension    PAD (peripheral artery disease) ([place])    Past Surgical History:  Past Surgical History:  Procedure Laterality Date   AMPUTATION Left 08/06/2021   Procedure: LEFTAMPUTATION ABOVE KNEE;  Surgeon: Angelia Mould, MD;  Location: [place];  Service: Vascular;  Laterality: Left;   APPLICATION OF CRANIAL NAVIGATION N/A 05/23/2021   Procedure: APPLICATION OF CRANIAL NAVIGATION;  Surgeon: Kristeen Miss, MD;  Location: [place];  Service: Neurosurgery;  Laterality: N/A;   double bypass  2012   FRAMELESS  BIOPSY WITH BRAINLAB N/A 05/23/2021   Procedure: Stereotactic needle biopsy of the brain w/brainlab;  Surgeon: Kristeen Miss, MD;  Location: [place];  Service: Neurosurgery;  Laterality: N/A;   HPI:  Pt is a 66 y.o. male who  presented to [facility] ED 6/28 with complex partial status epilepticus involving the left face and LUE. Pt reportedly developed left "facial swelling" earlier that morning but was otherwise normal. At 1530, daughter found pt to be choking. CT head: negative for infarct, but showed Increased edema in the right greater than left cerebral hemispheres. LTM EEG revealed right sided BIRDs and PLEDs. Pty was too lethargic to complete [place] on 6/28. PMH: CVA, CAD, HLD, HTN, PAD, metastatic squamous cell carcinoma of the lung diagnosed in 2020 with brain mets diagnosed in Feb 2022 s/p ?partial resection + chemo + radiation + immunotherapy with course complicated by post-radiation partial brain necrosis (confirmed by stereotactic brain biopsy of right frontal lobe 05/23/21),  seizures, PAD s/p BKA, HTN, HLD, CAD.    Assessment / Plan / Recommendation  Clinical Impression  Pt was seen for bedside swallow evaluation. Pt reported that he occasionally coughs with p.o. intake and has anterior spillage with large boluses. Oral mechanism exam was significant for left-sided facial weakness, and reduced labial and lingual strength. Full dentures were noted. He presented with symptoms of oropharyngeal dysphagia characterized by anterior spillage of liquids, left-sided oral residue, and signs of aspiration with thin liquids. Coughing was reduced, but not eliminated with cues to reduce bolus size, but pt exhibited difficulty consistently doing this. A dysphagia 3 diet with nectar thick liquids is recommended at this time. SLP will complete instrumental assessment on subsequent date if pt's symptoms persist. SLP Visit Diagnosis: Dysphagia, unspecified (R13.10)    Aspiration Risk  Mild aspiration risk    Diet Recommendation Dysphagia 3 (Mech soft);Nectar-thick liquid   Liquid Administration via: Cup;Straw Medication Administration: Whole meds with liquid (or with puree) Supervision: Staff to assist with self feeding;Intermittent supervision to cue for compensatory strategies Compensations: Slow rate;Small sips/bites;Follow solids with liquid Postural Changes: Seated upright at 90 degrees    Other  Recommendations      Recommendations for follow up therapy are one component of a multi-disciplinary discharge planning process, led by the attending physician.  Recommendations may be updated based on patient status, additional functional criteria and insurance authorization.  Follow up Recommendations  (TBD)      Assistance Recommended at Discharge Intermittent Supervision/Assistance  Functional Status Assessment Patient has not had a recent decline in their functional status  Frequency  and Duration min 2x/week  2 weeks       Prognosis Prognosis for Safe Diet Advancement:  Good      Swallow Study   General Date of Onset: 08/30/21 HPI: Pt is a 66 y.o. male who  presented to [facility] ED 6/28 with complex partial status epilepticus involving the left face and LUE. Pt reportedly developed left "facial swelling" earlier that morning but was otherwise normal. At 1530, daughter found pt to be choking. CT head: negative for infarct, but showed Increased edema in the right greater than left cerebral hemispheres. LTM EEG revealed right sided BIRDs and PLEDs. Pty was too lethargic to complete [place] on 6/28. 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 Type of Study: Bedside Swallow Evaluation Previous Swallow Assessment: none Diet Prior to this Study: NPO Temperature Spikes Noted: No Respiratory Status: Nasal cannula History of Recent Intubation: No Behavior/Cognition: Alert;Cooperative;Pleasant mood Oral Cavity Assessment: Within Functional Limits Oral Care Completed by SLP: No Oral Cavity - Dentition: Adequate natural dentition Vision: Functional for self-feeding Self-Feeding Abilities: Able to feed self Patient Positioning: Upright in bed;Postural control adequate for testing Baseline Vocal Quality: Low vocal intensity Volitional Cough: Weak;Congested Volitional Swallow: Able to elicit    Oral/Motor/Sensory Function Overall Oral Motor/Sensory Function: Moderate impairment Facial ROM: Reduced left;Suspected CN VII (facial) dysfunction Facial Symmetry: Abnormal symmetry left;Suspected CN VII (facial) dysfunction Facial Strength: Reduced left;Suspected CN VII (facial) dysfunction Facial Sensation: Within Functional Limits Lingual Symmetry: Within Functional  Limits Lingual Strength: Reduced;Suspected CN XII (hypoglossal) dysfunction Velum: Within Functional Limits Mandible: Within Functional Limits   Ice Chips Ice chips: Within functional limits Presentation: Spoon   Thin Liquid Thin Liquid: Impaired Presentation: Cup;Straw Oral Phase Impairments: Reduced labial seal Oral Phase Functional Implications: Left anterior spillage    Nectar Thick Nectar Thick Liquid: Impaired Presentation: Straw;Cup Oral Phase Impairments: Reduced labial seal   Honey Thick Honey Thick Liquid: Not tested   Puree Puree: Impaired Presentation: Spoon Oral Phase Impairments: Reduced labial seal Oral Phase Functional Implications: Left anterior spillage;Oral residue;Left lateral sulci pocketing   Solid     Solid: Impaired Presentation: Self Fed Oral Phase Functional Implications: Oral residue     [place] Violet I. Hardin Negus, [place], [place] Office number [phone] Pager [phone]  Horton Marshall 08/31/2021,1:17 PM

## 2021-08-31 NOTE — Progress Notes (Signed)
Mag 1.8 Replaced per protocol  

## 2021-08-31 NOTE — Progress Notes (Signed)
Subjective: Seizures improved.  States he is feeling fine.  Denies any concerns.  ROS: negative except above  Examination  Vital signs in last 24 hours: Temp:  [97.6 F (36.4 C)-98.6 F (37 C)] 97.9 F (36.6 C) (06/29 1142) Pulse Rate:  [64-87] 81 (06/29 1200) Resp:  [9-23] 15 (06/29 1200) BP: (106-178)/(62-104) 130/73 (06/29 1200) SpO2:  [94 %-100 %] 97 % (06/29 1200) Weight:  [71.9 kg] 71.9 kg (06/29 0052)  General: lying in bed, not in apparent distress Neuro: Awake, alert, oriented to time place and person, follows all commands, flattening of left nasolabial fold, antigravity strength in all extremities, left BKA  Basic Metabolic Panel: Recent Labs  Lab 08/30/21 1608 08/30/21 1617 08/31/21 0515  NA 137 137 139  K 4.4 4.3 4.1  CL 104 103 103  CO2 22  --  24  GLUCOSE 111* 110* 116*  BUN 17 20 15   CREATININE 0.69 0.50* 0.59*  CALCIUM 9.3  --  9.5  MG  --   --  1.8  PHOS  --   --  3.5    CBC: Recent Labs  Lab 08/30/21 1608 08/30/21 1617 08/31/21 0515  WBC 10.5  --  13.2*  NEUTROABS 8.1*  --   --   HGB 12.6* 13.3 12.2*  HCT 39.8 39.0 38.5*  MCV 95.7  --  92.5  PLT 253  --  233     Coagulation Studies: Recent Labs    08/30/21 1608  LABPROT 13.3  INR 1.0    Imaging CT head without contrast 08/22/2021: No evidence of acute cortically based infarct or intracranial hemorrhage. Increased edema in the right greater than left cerebral hemispheres. No significant midline shift.  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  Focal convulsive status epilepticus, resolved Metastatic brain disease Squamous cell lung cancer -Seizures improved, last seizure was on 08/31/2021 at 0524.  Recommendations -Continue Keppra 1500 mg twice daily -Continue Depakote 500 mg every 8 hours. -We will switch AEDs to p.o. tomorrow if  patient able to consistently take them today -Will discontinue EEG to obtain MRI brain with and without contrast -If seizures recur, please load with Vimpat 400 mg once and start 100 mg twice daily and resume EEG -Continue seizure precautions -As needed IV Ativan 2 mg for clinical seizure-like activity -Management of rest of comorbidities per primary team  I have spent a total of  38 minutes with the patient reviewing hospital notes,  test results, labs and examining the patient as well as establishing an assessment and plan that was discussed personally with the patient.  > 50% of time was spent in direct patient care.   Zeb Comfort Epilepsy [facility] For questions after 5pm please refer to AMION to reach the Neurologist on call

## 2021-09-01 ENCOUNTER — Other Ambulatory Visit (HOSPITAL_COMMUNITY): Payer: Self-pay

## 2021-09-01 DIAGNOSIS — G40901 Epilepsy, unspecified, not intractable, with status epilepticus: Secondary | ICD-10-CM | POA: Diagnosis not present

## 2021-09-01 LAB — BASIC METABOLIC PANEL
Anion gap: 14 (ref 5–15)
BUN: 18 mg/dL (ref 8–23)
CO2: 23 mmol/L (ref 22–32)
Calcium: 9.5 mg/dL (ref 8.9–10.3)
Chloride: 103 mmol/L (ref 98–111)
Creatinine, Ser: 0.68 mg/dL (ref 0.61–1.24)
GFR, Estimated: >60 mL/min (ref >60)
Glucose, Bld: 116 mg/dL — ABNORMAL HIGH (ref 70–99)
Potassium: 4.1 mmol/L (ref 3.5–5.1)
Sodium: 140 mmol/L (ref 135–145)

## 2021-09-01 LAB — CBC
HCT: 34.9 % — ABNORMAL LOW (ref 39.0–52.0)
Hemoglobin: 11.7 g/dL — ABNORMAL LOW (ref 13.0–17.0)
MCH: 30.3 pg (ref 26.0–34.0)
MCHC: 33.5 g/dL (ref 30.0–36.0)
MCV: 90.4 fL (ref 80.0–100.0)
Platelets: 215 10*3/uL (ref 150–400)
RBC: 3.86 MIL/uL — ABNORMAL LOW (ref 4.22–5.81)
RDW: 15.5 % (ref 11.5–15.5)
WBC: 7.7 10*3/uL (ref 4.0–10.5)
nRBC: 0 % (ref 0.0–0.2)

## 2021-09-01 LAB — VALPROIC ACID LEVEL: Valproic Acid Lvl: 47 ug/mL — ABNORMAL LOW (ref 50.0–100.0)

## 2021-09-01 MED ORDER — DEXAMETHASONE 2 MG PO TABS: 4.0000 mg | ORAL_TABLET | Freq: Two times a day (BID) | ORAL | 0 refills | Status: DC

## 2021-09-01 MED ORDER — GABAPENTIN 300 MG PO CAPS
300.0000 mg | ORAL_CAPSULE | Freq: Three times a day (TID) | ORAL | Status: DC
  Administered 2021-09-01: 300 mg via ORAL
  Filled 2021-09-01: qty 1

## 2021-09-01 MED ORDER — DIVALPROEX SODIUM 250 MG PO DR TAB
750.0000 mg | DELAYED_RELEASE_TABLET | Freq: Two times a day (BID) | ORAL | Status: DC
  Filled 2021-09-01: qty 1

## 2021-09-01 MED ORDER — DIVALPROEX SODIUM 250 MG PO DR TAB: 750.0000 mg | DELAYED_RELEASE_TABLET | Freq: Two times a day (BID) | ORAL | 0 refills | Status: DC

## 2021-09-01 MED ORDER — LEVETIRACETAM 750 MG PO TABS: 1500.0000 mg | ORAL_TABLET | Freq: Two times a day (BID) | ORAL | 0 refills | Status: DC

## 2021-09-01 MED ORDER — AMLODIPINE BESYLATE 2.5 MG PO TABS
2.5000 mg | ORAL_TABLET | Freq: Every day | ORAL | Status: DC
  Administered 2021-09-01: 2.5 mg via ORAL
  Filled 2021-09-01: qty 1

## 2021-09-01 MED ORDER — LEVETIRACETAM 750 MG PO TABS
1500.0000 mg | ORAL_TABLET | Freq: Two times a day (BID) | ORAL | Status: DC
Start: 2021-09-01 — End: 2021-09-01

## 2021-09-01 MED ORDER — PANTOPRAZOLE SODIUM 40 MG PO TBEC
40.0000 mg | DELAYED_RELEASE_TABLET | Freq: Every day | ORAL | Status: DC
  Administered 2021-09-01: 40 mg via ORAL
  Filled 2021-09-01: qty 1

## 2021-09-01 MED ORDER — MAGNESIUM GLUCONATE 500 MG PO TABS
250.0000 mg | ORAL_TABLET | Freq: Every day | ORAL | Status: DC
  Filled 2021-09-01: qty 1

## 2021-09-01 MED ORDER — ATORVASTATIN CALCIUM 80 MG PO TABS
80.0000 mg | ORAL_TABLET | Freq: Every day | ORAL | Status: DC
  Administered 2021-09-01: 80 mg via ORAL
  Filled 2021-09-01: qty 1

## 2021-09-01 MED ORDER — HYDROCODONE-ACETAMINOPHEN 5-325 MG PO TABS
1.0000 | ORAL_TABLET | ORAL | Status: DC | PRN
  Administered 2021-09-01: 1 via ORAL
  Filled 2021-09-01: qty 1

## 2021-09-01 MED ORDER — VITAMIN D (ERGOCALCIFEROL) 1.25 MG (50000 UNIT) PO CAPS
50000.0000 [IU] | ORAL_CAPSULE | ORAL | Status: DC
  Administered 2021-09-01: 50000 [IU] via ORAL
  Filled 2021-09-01 (×2): qty 1

## 2021-09-01 MED ORDER — ACETAMINOPHEN 325 MG PO TABS
650.0000 mg | ORAL_TABLET | Freq: Four times a day (QID) | ORAL | Status: DC | PRN
  Administered 2021-09-01 (×2): 650 mg via ORAL
  Filled 2021-09-01 (×2): qty 2

## 2021-09-01 MED ORDER — VITAMIN D (ERGOCALCIFEROL) 1.25 MG (50000 UNIT) PO CAPS
50000.0000 [IU] | ORAL_CAPSULE | ORAL | Status: DC
  Filled 2021-09-01: qty 1

## 2021-09-01 MED ORDER — DOCUSATE SODIUM 100 MG PO CAPS
100.0000 mg | ORAL_CAPSULE | Freq: Every day | ORAL | Status: DC
  Administered 2021-09-01: 100 mg via ORAL
  Filled 2021-09-01: qty 1

## 2021-09-01 MED ORDER — PROCHLORPERAZINE EDISYLATE 10 MG/2ML IJ SOLN
10.0000 mg | Freq: Four times a day (QID) | INTRAMUSCULAR | Status: DC | PRN
Start: 2021-09-01 — End: 2021-09-01
  Administered 2021-09-01: 10 mg via INTRAVENOUS
  Filled 2021-09-01: qty 2

## 2021-09-01 NOTE — Progress Notes (Addendum)
[facility] Progress Note Patient Name: Roger Solis DOB: 01/05/1956 MRN: [id number]   Date of Service  09/01/2021  HPI/Events of Note  Notified of nausea.  Qtc 450 on EKG.   eICU Interventions  Compazine ordered PRN.      Intervention Category Minor Interventions: Routine modifications to care plan (e.g. PRN medications for pain, fever)  Elsie Lincoln 09/01/2021, 4:12 AM  6:45 AM Pt requesting tylenol for pain.   Plan> Tylenol ordered PRN.

## 2021-09-01 NOTE — Progress Notes (Signed)
Subjective: No further seizures overnight  ROS: negative except above  Examination  Vital signs in last 24 hours: Temp:  [97.6 F (36.4 C)-98.2 F (36.8 C)] 98.2 F (36.8 C) (06/30 0800) Pulse Rate:  [71-90] 74 (06/30 0900) Resp:  [12-29] 15 (06/30 0900) BP: (103-158)/(57-101) 144/74 (06/30 0900) SpO2:  [91 %-98 %] 97 % (06/30 0900)  General: lying in bed, not in apparent distress Neuro: Awake, alert, oriented to time place and person, follows all commands, flattening of left nasolabial fold, antigravity strength in all extremities, left BKA  Basic Metabolic Panel: Recent Labs  Lab 08/30/21 1608 08/30/21 1617 08/31/21 0515 09/01/21 0324  NA 137 137 139 140  K 4.4 4.3 4.1 4.1  CL 104 103 103 103  CO2 22  --  24 23  GLUCOSE 111* 110* 116* 116*  BUN 17 20 15 18   CREATININE 0.69 0.50* 0.59* 0.68  CALCIUM 9.3  --  9.5 9.5  MG  --   --  1.8  --   PHOS  --   --  3.5  --     CBC: Recent Labs  Lab 08/30/21 1608 08/30/21 1617 08/31/21 0515 09/01/21 0324  WBC 10.5  --  13.2* 7.7  NEUTROABS 8.1*  --   --   --   HGB 12.6* 13.3 12.2* 11.7*  HCT 39.8 39.0 38.5* 34.9*  MCV 95.7  --  92.5 90.4  PLT 253  --  233 215     Coagulation Studies: Recent Labs    08/30/21 1608  LABPROT 13.3  INR 1.0    Imaging MRI brain with and without contrast 08/31/2021: Enlargement of the region of enhancement and necrosis at the right posterior frontal lobe, measuring 2.8 x 3.6 x 2.6 cm today compared with 2.6 x 2.3 x 2.8 cm previously. Increased regional vasogenic edema. 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 Stable 11 mm focus of enhancement and surrounding edema at the left posterior frontal vertex. No new lesion.  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   Focal  convulsive status epilepticus, resolved Metastatic brain disease Squamous cell lung cancer -Seizures improved, last seizure was on 08/31/2021 at 0524.   Recommendations -Continue Keppra 1500 mg BID, Depakote 750 mg BID -Recommend intranasal Valtoco 20 mg (10 mg in each nostril) for seizure lasting over 5 minutes. Can repeat once after 4 hours. Please notify physician if requiring more than 5 times/month.  -Of note, patient was already loaded with Depakote, therefore continuing that for now.  In future, if Depakote is interacting with other medications, can consider switching to Vimpat instead. -Discussed MRI findings with patient.  Recommend follow-up with Dr. Mickeal Skinner ASAP.  Dr. Mickeal Skinner was contacted via secure chat by Dr. Cheral Marker. -Continue seizure precautions -As needed IV Ativan 2 mg for clinical seizure-like activity -Management of rest of comorbidities per primary team  Seizure precautions: Per [facility] statutes, patients with seizures are not allowed to drive until they have been seizure-free for six months and cleared by a physician    Use caution when using heavy equipment or power tools. Avoid working on ladders or at heights. Take showers instead of baths. Ensure the water temperature is not too high on the home water heater. Do not go swimming alone. Do not lock yourself in a room alone (i.e. bathroom). When caring  for infants or small children, sit down when holding, feeding, or changing them to minimize risk of injury to the child in the event you have a seizure. Maintain good sleep hygiene. Avoid alcohol.    If patient has another seizure, call 911 and bring them back to the ED if: A.  The seizure lasts longer than 5 minutes.      B.  The patient doesn't wake shortly after the seizure or has new problems such as difficulty seeing, speaking or moving following the seizure C.  The patient was injured during the seizure D.  The patient has a temperature over 102 F (39C) E.   The patient vomited during the seizure and now is having trouble breathing    During the Seizure   - First, ensure adequate ventilation and place patients on the floor on their left side  Loosen clothing around the neck and ensure the airway is patent. If the patient is clenching the teeth, do not force the mouth open with any object as this can cause severe damage - Remove all items from the surrounding that can be hazardous. The patient may be oblivious to what's happening and may not even know what he or she is doing. If the patient is confused and wandering, either gently guide him/her away and block access to outside areas - Reassure the individual and be comforting - Call 911. In most cases, the seizure ends before EMS arrives. However, there are cases when seizures may last over 3 to 5 minutes. Or the individual may have developed breathing difficulties or severe injuries. If a pregnant patient or a person with diabetes develops a seizure, it is prudent to call an ambulance. - Finally, if the patient does not regain full consciousness, then call EMS. Most patients will remain confused for about 45 to 90 minutes after a seizure, so you must use judgment in calling for help.     After the Seizure (Postictal Stage)   After a seizure, most patients experience confusion, fatigue, muscle pain and/or a headache. Thus, one should permit the individual to sleep. For the next few days, reassurance is essential. Being calm and helping reorient the person is also of importance.   Most seizures are painless and end spontaneously. Seizures are not harmful to others but can lead to complications such as stress on the lungs, brain and the heart. Individuals with prior lung problems may develop labored breathing and respiratory distress.   I have spent a total of 36 minutes with the patient reviewing hospital notes,  test results, labs and examining the patient as well as establishing an assessment and plan  that was discussed personally with the patient.  > 50% of time was spent in direct patient care.  Zeb Comfort Epilepsy [facility] For questions after 5pm please refer to AMION to reach the Neurologist on call

## 2021-09-01 NOTE — Progress Notes (Signed)
Patient and daughter, Hiedi, were given discharge instructions regarding follow up appts as well as new medications and changes to existing medications.  All questions were answered.  Patient's Peripheral IV was removed and patient was taken down to valet.  At which point patient got into his diaughters car.

## 2021-09-01 NOTE — Evaluation (Signed)
Physical Therapy Evaluation Patient Details Name: Roger Solis MRN: [id number] DOB: 19-Mar-1955 Today's Date: 09/01/2021  History of Present Illness  Pt is a 66 y.o. male who presented 08/30/21 with complex partial status epilepticus involving the left face and LUE along with L facial swelling. "MRI brain with and without contrast 08/31/2021: Enlargement of the region of enhancement and necrosis at the right posterior frontal lobe, measuring 2.8 x 3.6 x 2.6 cm today compared with 2.6 x 2.3 x 2.8 cm previously. Increased regional vasogenic edema. 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 Stable 11 mm focus of enhancement and surrounding edema at the left posterior frontal vertex. No new lesion." PMH includes HTN, HLD, CAD s/p CABG, PAD s/p PCI w/ stents, primary lung cancer with brain metastasis, s/p L AKA 08/06/21   Clinical Impression  Pt presents with condition above and deficits mentioned below, see PT Problem List. PTA, he was mod I for pivot transfer between surfaces but needed some assistance for ADLs. Pt lives with his daughter and son-in-law in a 1-level house with a ramp entrance. They are well equipped at home and already have a pressure relief/redistribution w/c cushion. Educated them on pressure relief/redistribution techniques and schedules. Currently, pt is requiring up to minA for bed mobility and to come to a partial stand, but modA to clear his buttocks over an arm rest to transfer bed > recliner. Pt prefers to transfer to his L (arm rest would not drop on recliner today), and his arm rests on his w/c and bedside commode do drop at home, which would make this transfer easier. Pt is demonstrating deficits in cognition, balance, L arm and leg strength, and L residual limb sensation. Educated pt and family on his increased need for assistance, which they report they had been previously trained on and can provide. Thus,  recommend pt continue with HHPT and HHOT upon d/c. Will continue to follow acutely.     Recommendations for follow up therapy are one component of a multi-disciplinary discharge planning process, led by the attending physician.  Recommendations may be updated based on patient status, additional functional criteria and insurance authorization.  Follow Up Recommendations Home health PT      Assistance Recommended at Discharge Frequent or constant Supervision/Assistance  Patient can return home with the following  A lot of help with walking and/or transfers;A little help with bathing/dressing/bathroom;Assistance with cooking/housework;Direct supervision/assist for medications management;Direct supervision/assist for financial management;Assist for transportation;Help with stairs or ramp for entrance    Equipment Recommendations None recommended by PT  Recommendations for Other Services       Functional Status Assessment Patient has had a recent decline in their functional status and demonstrates the ability to make significant improvements in function in a reasonable and predictable amount of time.     Precautions / Restrictions Precautions Precautions: Fall Precaution Comments: seizures; s/p L AKA 08/06/21 Restrictions Weight Bearing Restrictions: Yes LLE Weight Bearing: Non weight bearing      Mobility  Bed Mobility Overal bed mobility: Needs Assistance Bed Mobility: Rolling, Sidelying to Sit Rolling: Supervision Sidelying to sit: Min assist       General bed mobility comments: Pt rolling either direction with extra time, supervision. MinA for pt to pull on hand to ascend trunk to sit EOB.    Transfers Overall transfer level: Needs assistance Equipment used: 1 person hand held assist Transfers: Bed to chair/wheelchair/BSC, Sit to/from Stand Sit to Stand: Min assist  Squat pivot transfers: Mod assist     General transfer comment: Pt able to lift buttocks off EOB  with minA, achieving partial standing position to work on squat pivot transfers. ModA to clear buttocks over arm rest of recliner as pt wanted to transfer to his L, which the arm rest does not drop on.    Ambulation/Gait               General Gait Details: deferred  Stairs            Wheelchair Mobility    Modified Rankin (Stroke Patients Only) Modified Rankin (Stroke Patients Only) Pre-Morbid Rankin Score: Severe disability Modified Rankin: Severe disability     Balance Overall balance assessment: Needs assistance   Sitting balance-Leahy Scale: Good Sitting balance - Comments: No LOB sitting EOB, supervision for safety   Standing balance support: Single extremity supported, Bilateral upper extremity supported, During functional activity Standing balance-Leahy Scale: Poor Standing balance comment: Reliant on UE support and external assist                             Pertinent Vitals/Pain Pain Assessment Pain Assessment: Faces Faces Pain Scale: No hurt Pain Intervention(s): Monitored during session    Home Living Family/patient expects to be discharged to:: Private residence Living Arrangements: Children (daughter and son-in-law) Available Help at Discharge: Family;Available 24 hours/day Type of Home: House Home Access: Ramped entrance       Home Layout: One level Home Equipment: Conservation officer, nature (2 wheels);Rollator (4 wheels);Tub bench;Hand held shower head;Wheelchair - manual;Other (comment);BSC/3in1;Grab bars - toilet;Grab bars - tub/shower (pressure relief w/c cushion; drop-arm bari bedside commode and w/c; grab bar next to bed)      Prior Function Prior Level of Function : Needs assist       Physical Assist : ADLs (physical)     Mobility Comments: was performing transfers mod I ADLs Comments: Needs assistance for difficult to reach places with showers.     Hand Dominance   Dominant Hand: Right    Extremity/Trunk Assessment    Upper Extremity Assessment Upper Extremity Assessment: LUE deficits/detail LUE Deficits / Details: MMT scores of 4 shoulder flexion, 4+ elbow flexion, 4- elbow extension, weaker grip than on R; denies numbness/tingling bil; coordination intact, but slow alternating movements due to weakness    Lower Extremity Assessment Lower Extremity Assessment: LLE deficits/detail;RLE deficits/detail RLE Deficits / Details: MMT scores of 4+ hip flexion, 4+ knee extension, 5 ankle dorsiflexion; denies numbness/tingling LLE Deficits / Details: s/p L AKA 08/06/21; reports tingling distally; MMT scores of 4 hip flexion, 4 hip abduction (tested in sitting) LLE Sensation: decreased light touch    Cervical / Trunk Assessment Cervical / Trunk Assessment: Kyphotic  Communication   Communication: HOH  Cognition Arousal/Alertness: Awake/alert Behavior During Therapy: WFL for tasks assessed/performed Overall Cognitive Status: Impaired/Different from baseline Area of Impairment: Attention, Problem solving, Awareness                   Current Attention Level: Selective       Awareness: Emergent Problem Solving: Slow processing, Difficulty sequencing, Requires verbal cues General Comments: Pt with slow processing and problem-solving, needing step-by-step cues for transfers.        General Comments General comments (skin integrity, edema, etc.): educated pt and family on pressure redistribution techniques and schedule; they confirmed pt already has a pressure relief cushion that has been helping with the wounds; discussed pt would  need assistance for transfers at this time and could potentially get weaker depending on his illness    Exercises     Assessment/Plan    PT Assessment Patient needs continued PT services  PT Problem List Decreased activity tolerance;Decreased strength;Decreased balance;Decreased mobility;Pain;Decreased safety awareness;Decreased cognition;Decreased skin integrity        PT Treatment Interventions DME instruction;Gait training;Functional mobility training;Therapeutic activities;Therapeutic exercise;Balance training;Neuromuscular re-education;Cognitive remediation;Patient/family education;Wheelchair mobility training    PT Goals (Current goals can be found in the Care Plan section)  Acute Rehab PT Goals Patient Stated Goal: to get stronger PT Goal Formulation: With patient/family Time For Goal Achievement: 09/15/21 Potential to Achieve Goals: Good    Frequency Min 4X/week     Co-evaluation               AM-PAC PT "6 Clicks" Mobility  Outcome Measure Help needed turning from your back to your side while in a flat bed without using bedrails?: A Little Help needed moving from lying on your back to sitting on the side of a flat bed without using bedrails?: A Little Help needed moving to and from a bed to a chair (including a wheelchair)?: A Lot Help needed standing up from a chair using your arms (e.g., wheelchair or bedside chair)?: A Little Help needed to walk in hospital room?: Total Help needed climbing 3-5 steps with a railing? : Total 6 Click Score: 13    End of Session Equipment Utilized During Treatment: Gait belt Activity Tolerance: Patient tolerated treatment well Patient left: in chair;with call bell/phone within reach;with family/visitor present Nurse Communication: Mobility status PT Visit Diagnosis: Unsteadiness on feet (R26.81);Muscle weakness (generalized) (M62.81);Difficulty in walking, not elsewhere classified (R26.2);Other symptoms and signs involving the nervous system (R29.898)    Time: 1345-1419 PT Time Calculation (min) (ACUTE ONLY): 34 min   Charges:   PT Evaluation $PT Eval Moderate Complexity: 1 Mod PT Treatments $Therapeutic Activity: 8-22 mins        Moishe Spice, PT, DPT Acute Rehabilitation Services  Office: [phone]   Orvan Falconer 09/01/2021, 2:35 PM

## 2021-09-01 NOTE — Progress Notes (Addendum)
Attending addendum:  Seen and examined. Daughter at bedside. Speech more clear this AM, persistent left sided weakness and facial droop. R BKA site covered Answering questions appropriately Heart/lung exams normal Needs more/better nutrition, will see if allowed to have protein shakes. WOC reconsult for sacral wound to see if anything else daughter can do at home to facilitate healing. Daughter also asking about having PRN non oral seizure-abalating meds at home for additional seizures to save him ER trip.  She seems very saavy with this and knows to call EMS if refractory.  Will touch base with neuro to see what they would recommend for dosing. Okay for transfer out of ICU.  Erskine Emery MD PCCM    NAME:  Roger Solis, MRN:  [id number], DOB:  June 19, 1955, LOS: 2 ADMISSION DATE:  08/30/2021, CONSULTATION DATE:  08/30/21 REFERRING MD:  Cheral Marker CHIEF COMPLAINT:  Focal Status Epilepticus   History of Present Illness:  Roger Solis is a 66 y.o. 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  He presented to [facility] ED 6/28 with complex partial status epilepticus involving the left face and LUE. He apparently developed left "facial swelling" earlier that morning but was otherwise normal. LKN 1400 and at 68, daughter found him to be choking and experiencing dysphagia.  CT head was neg for infarct or hemorrhage. He was evaluated by neurology who deemed him to have focal status epilepticus. He was loaded with 3g Keppra and received a total of 8mg  IV Ativan. He was then connected to cEEG and PCCM was asked to admit to the ICU for ongoing EEG monitoring.  At the  time of my evaluation, he is somnolent with decreased LOC; however, he does have intact gag and cough/is protecting his airway.  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  Significant Hospital Events: Including procedures, antibiotic start and stop dates in addition to other pertinent events   6/28 admit with focal seizures  CT head > no infarct or hemorrhage, increased edema R > L cerebral hemispheres. 6/29 no further seizures overnight, daughter at bedside this am. EEG remains in place  MRI brain Enlargement of the region of enhancement and necrosis at the right posterior frontal lobe, measuring 2.8 x 3.6 x 2.6 cm today compared with 2.6 x 2.3 x 2.8 cm previously. Increased regional vasogenic edema. 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 Stable 11 mm focus of enhancement and surrounding edema at the left posterior frontal vertex. No new lesion 6/30 No further seizure activity seen overnight, he appears more alert and slightly less dysarthric this am   Interim History / Subjective:  Alert and oriented  this am, seen eating breakfast this am. Daughter at bedside and updated   Objective:  Blood pressure 117/76, pulse 89, temperature 98 F (36.7 C), temperature source Oral, resp. rate 18,  weight 71.9 kg, SpO2 96 %.        Intake/Output Summary (Last 24 hours) at 09/01/2021 0803 Last data filed at 09/01/2021 0700 Gross per 24 hour  Intake 1751.71 ml  Output 2550 ml  Net -798.29 ml    Filed Weights   08/31/21 0052  Weight: 71.9 kg    Examination: General: Acute on chronically slightly deconditioned middle aged  male sitting up in bed, in NAD HEENT: [place]/AT, MM pink/moist, PERRL,  Neuro: Alert and oriented x3, slight left sided weakness 3/5 strength  CV: s1s2 regular rate and rhythm, no murmur, rubs, or gallops,  PULM:  Clear to ascultation bilaterally, no increased work of breathing  GI: soft, bowel sounds active in all 4 quadrants, non-tender, non-distended, tolerating mechanical diet Extremities: warm/dry, no edema, left BLA  Skin: no rashes or lesions  Resolved problems:  At risk for airway compromise  -Never required advanced airway   Assessment & Plan:   Focal status epilepticus with underlying hx of seizures and brain mets - s/p 8mg  Ativan and 3g Keppra load. Hx radiation therapy induced brain necrosis, CVA. P: Primary management per neurology  AEDs per neurology  Decadron per neurology  Per primary oncologist please discharge patient on 4mg  PO BID of Decadron  Frequent neuro exams  MRI as above   Hx squamous cell carcinoma of the lung with mets to brain  - s/p ? Partial resection + chemo + radiation + immunotherapy with course complicated by post radiation partial brain necrosis (followed by Dr. Mickeal Skinner). P: Decadron as above  Primary oncologist is aware of hospitalization  Follow up with oncologist in the outpatient setting   Hx CAD, HTN, HLD P: Resume home medications    Hx PAD s/p recent L BKA (08/06/21) P: Continue supportive care  Resume home Gabapentin   Best practice (evaluated daily):  Diet/type: NPO DVT prophylaxis: prophylactic heparin  GI prophylaxis: N/A Lines: N/A Foley:  N/A Code Status:  full code - recommend ongoing goals  of care discussions with family. Daughter and patient updated at bedside this am   Critical care time: NA  Whitney D. Kenton Kingfisher, NP-C [facility] Personal contact information can be found on Amion  09/01/2021, 8:03 AM

## 2021-09-01 NOTE — Discharge Instructions (Signed)
Seizure precautions: Per [facility] statutes, patients with seizures are not allowed to drive until they have been seizure-free for six months and cleared by a physician    Use caution when using heavy equipment or power tools. Avoid working on ladders or at heights. Take showers instead of baths. Ensure the water temperature is not too high on the home water heater. Do not go swimming alone. Do not lock yourself in a room alone (i.e. bathroom). When caring for infants or small children, sit down when holding, feeding, or changing them to minimize risk of injury to the child in the event you have a seizure. Maintain good sleep hygiene. Avoid alcohol.    If patient has another seizure, call 911 and bring them back to the ED if: A.  The seizure lasts longer than 5 minutes.      B.  The patient doesn't wake shortly after the seizure or has new problems such as difficulty seeing, speaking or moving following the seizure C.  The patient was injured during the seizure D.  The patient has a temperature over 102 F (39C) E.  The patient vomited during the seizure and now is having trouble breathing    During the Seizure   - First, ensure adequate ventilation and place patients on the floor on their left side  Loosen clothing around the neck and ensure the airway is patent. If the patient is clenching the teeth, do not force the mouth open with any object as this can cause severe damage - Remove all items from the surrounding that can be hazardous. The patient may be oblivious to what's happening and may not even know what he or she is doing. If the patient is confused and wandering, either gently guide him/her away and block access to outside areas - Reassure the individual and be comforting - Call 911. In most cases, the seizure ends before EMS arrives. However, there are cases when seizures may last over 3 to 5 minutes. Or the individual may have developed breathing difficulties or severe  injuries. If a pregnant patient or a person with diabetes develops a seizure, it is prudent to call an ambulance. - Finally, if the patient does not regain full consciousness, then call EMS. Most patients will remain confused for about 45 to 90 minutes after a seizure, so you must use judgment in calling for help.      After the Seizure (Postictal Stage)   After a seizure, most patients experience confusion, fatigue, muscle pain and/or a headache. Thus, one should permit the individual to sleep. For the next few days, reassurance is essential. Being calm and helping reorient the person is also of importance.   Most seizures are painless and end spontaneously. Seizures are not harmful to others but can lead to complications such as stress on the lungs, brain and the heart. Individuals with prior lung problems may develop labored breathing and respiratory distress.

## 2021-09-01 NOTE — Progress Notes (Signed)
Speech Language Pathology Treatment: Dysphagia  Patient Details Name: Roger Solis MRN: [id number] DOB: [birth year] Today's Date: 09/01/2021 Time: 1213-1225 SLP Time Calculation (min) (ACUTE ONLY): 12 min  Assessment / Plan / Recommendation Clinical Impression  Pt was seen during lunch for dysphagia treatment. He was alert and cooperative during the session and articulatory precision was improved. Pt and his nurse reported that the pt has been tolerating the current diet without overt s/sx of aspiration. Pt consumed a meal of spaghetti with meat sauce, and a roll. Pt was noted to independently cut up his food and reduce bolus sizes. Pt stated that he has learned to do this to avoid getting choked. However, he stated that this only occurs ~2 times per week depending on what he has been served. A single instance of coughing was noted with spaghetti which pt stated he did not cut small enough and it "got away" from him. No s/sx of aspiration were noted with thin liquids via cup or straw or with nectar thick liquids. Anterior spillage and residue in the left lateral sulcus were lessened today. Pt's diet will be advanced to dysphagia 3 solids and thin liquids which pt reports is his baseline. SLP will continue to follow pt.     HPI HPI: Pt is a 66 y.o. 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 Pt reportedly developed left "facial swelling" earlier that morning but was otherwise normal. At 1530, daughter found pt to be choking. CT head: negative for infarct, but showed Increased edema in the right greater than left cerebral hemispheres. LTM EEG revealed right sided BIRDs and PLEDs. Pty was too lethargic to complete [facility] on 6/28. MRI 6/29: MRI brain Enlargement of the region of enhancement and necrosis at the right posterior frontal lobe, measuring 2.8 x 3.6 x 2.6 cm today compared with 2.6 x 2.3 x 2.8 cm previously. Increased regional vasogenic  edema. 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 Stable 11 mm focus of enhancement and surrounding edema at the left posterior frontal vertex. 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      SLP Plan  Continue with current plan of care      Recommendations for follow up therapy are one component of a multi-disciplinary discharge planning process, led by the attending physician.  Recommendations may be updated based on patient status, additional functional criteria and insurance authorization.    Recommendations  Diet recommendations: Dysphagia 3 (mechanical soft);Thin liquid Liquids provided via: Cup;Straw Medication Administration: Whole meds with puree Supervision: Patient able to self feed;Intermittent supervision to cue for compensatory strategies Compensations: Slow rate;Small sips/bites;Follow solids with liquid Postural Changes and/or Swallow Maneuvers: Seated upright 90 degrees;Upright 30-60 min after meal                Follow Up Recommendations:  (TBD) Assistance recommended at discharge: Intermittent Supervision/Assistance SLP Visit Diagnosis: Dysphagia, unspecified (R13.10) Plan: Continue with current plan of care         Teyla Skidgel I. Hardin Negus, [place], [place] Office number [phone] Pager [phone]   Horton Marshall  09/01/2021, 12:30 PM

## 2021-09-01 NOTE — Consult Note (Signed)
WOC Nurse Consult Note: Reason for Consult:Stage 2 PI to left ischial tuberosity, healing closely approximated suture line to left amputation site with staples. Wound type: pressure, surgical Pressure Injury POA: Yes Measurement:1cm round x 0.1cm Wound BWI:OMBT, moist Drainage (amount, consistency, odor) scant serous Periwound:intact Dressing procedure/placement/frequency: Pressure injury to ischial tuberosity of body opposite amputation is a factor of pressure redistribution. It will be important for patient to understand listing and shifting of body weight at frequent intervals (every 30 minutes) to minimize the risk of continued pressure injuries in this area as well as the heal the current superficial, Stage 2 PI that is present today. A pressure redistribution chair cushion is provided today for in house use, PT can provide additional information of a more substantial pressure redistribution (Roho or Ulice Dash, for example) downstream. Topical care will with with a wound contact layer of antimicrobial nonadherent xeroform gauze topped with a silicone foam bordered dressing. This is to be changed daily and PRN. The left heel is to be floated while in bed.  [place] nursing team will not follow, but will remain available to this patient, the nursing and medical teams.  Please re-consult if needed.  Thank you for inviting [place] to participate in this patient's Plan of Care.  Maudie Flakes, MSN, RN, CNS, [place], Serita Grammes, [employer], [employer] phone:  [phone]

## 2021-09-01 NOTE — Discharge Summary (Signed)
Physician Discharge Summary       Patient ID: Roger Solis MRN: [id number] DOB/AGE: 1955-04-11 66 y.o.  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   He presented to [facility] ED 6/28 with complex partial status epilepticus involving the left face and LUE. He apparently developed left "facial swelling" earlier that morning but was otherwise normal. LKN 1400 and at 45, daughter found him to be choking and experiencing dysphagia.   CT head was neg for infarct or hemorrhage. He was evaluated by neurology who deemed him to have focal status epilepticus. He was loaded with 3g Keppra and received a total of 8mg  IV Ativan. He was then connected to cEEG and PCCM was asked to admit to the ICU for ongoing EEG monitoring.   Day following admission 6/29 patient remained on EEG with significant improvement in seizures seen. By that afternoon continuous EEG was removed and patient was taken for MRI brain that revealed slight enlargement of necrosis. On 6/30 patinet was evaluated by PT/OT and deemed stable for discharge home.   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 -MRI brain 6/29 Enlargement of the region of  enhancement and necrosis at the right posterior frontal lobe, measuring 2.8 x 3.6 x 2.6 cm today compared with 2.6 x 2.3 x 2.8 cm previously. Increased regional vasogenic edema. 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 Stable 11 mm focus of enhancement and surrounding edema at the left posterior frontal vertex. 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 Partial resection + chemo + radiation + immunotherapy with course complicated by post radiation partial brain necrosis (followed by Dr. Mickeal Skinner). Hx radiation therapy induced brain necrosis, CVA. P: Continue 4mg  PO BID of Decadron upon discharge  Continue Depakote 750mg  BID and Keppra 1500mg  BID upon discharge  Will need to follow up with Primary oncologist upon discharge  Per discussion with family and clinical team decision was made to add a PRN intranasal or rectal diazepam for additional seizure control at home. I spoke directly to the [facility] and they do NOT stock the intranasal diazepam but they DO carry the rectal form. Roger Solis (patients daughter) did no feel as if this was a viable option given the degree of seizure activity. At this time family wished to hold off on PRN diazepam until they can speak to primary oncologist.    Hx CAD, HTN, HLD P: Continue home Norvasc and Lipitor upon discharge    Hx PAD s/p recent L BKA (08/06/21) P: Continue home Gabapentin upon discharge    [name] events/studies  6/28 admit with focal seizures  CT head > no infarct or hemorrhage, increased edema R > L cerebral hemispheres. 6/29 no further seizures overnight, daughter at bedside this am. EEG remains in place  MRI brain Enlargement of the region of enhancement and necrosis at the right posterior frontal lobe, measuring 2.8 x 3.6 x 2.6 cm today compared with 2.6 x 2.3 x 2.8 cm previously. Increased regional vasogenic edema. This is  favored to represent radiation necrosis, though progressive aggressive tumor is not completely excluded. Stable 11 mm focus of enhancement and surrounding edema at the left posterior frontal vertex. No new lesion 6/30 No further seizure activity seen overnight, he is more alert and slightly less dysarthric this am  Procedures   None  Culture data/antimicrobials   Respiratory viral panel > negative  MRSA PCR > negative    Consults      Discharge Exam: BP 139/69   Pulse 96   Temp 98.7 F (37.1 C) (Oral)   Resp 19   Wt 71.9 kg   SpO2 94%   BMI 21.50 kg/m   General: Acute on chronically slightly deconditioned middle aged  male sitting up in bed, in NAD HEENT: [place]/AT, MM pink/moist, PERRL,  Neuro: Alert and oriented x3, slight left sided weakness 3/5 strength  CV: s1s2 regular rate and rhythm, no murmur, rubs, or gallops,  PULM:  Clear to ascultation bilaterally, no increased work of breathing  GI: soft, bowel sounds active in all 4 quadrants, non-tender, non-distended, tolerating mechanical diet Extremities: warm/dry, no edema, left BLA  Skin: no rashes or lesions  Labs at discharge   Lab Results  Component Value Date   CREATININE 0.68 09/01/2021   BUN 18 09/01/2021   NA 140 09/01/2021   K 4.1 09/01/2021   CL 103 09/01/2021   CO2 23 09/01/2021   Lab Results  Component Value Date   WBC 7.7 09/01/2021   HGB 11.7 (L) 09/01/2021   HCT 34.9 (L) 09/01/2021   MCV 90.4 09/01/2021   PLT 215 09/01/2021   Lab Results  Component Value Date   ALT 23 08/30/2021   AST 24 08/30/2021   ALKPHOS 66 08/30/2021   BILITOT 0.4 08/30/2021   Lab Results  Component Value Date   INR 1.0 08/30/2021   INR 0.9 06/17/2021   INR 0.9 05/23/2021    Current radiological studies    MR BRAIN W WO CONTRAST  Result Date: 08/31/2021 CLINICAL DATA:  Brain/CNS neoplasm.  Monitor. EXAM: MRI HEAD WITHOUT AND WITH CONTRAST TECHNIQUE: Multiplanar, multiecho pulse sequences of the brain and  surrounding structures were obtained without and with intravenous contrast. CONTRAST:  101mL GADAVIST GADOBUTROL 1 MMOL/ML IV SOLN COMPARISON:  Head CT yesterday. MRI 07/14/2021. multiple prior exams as distant as 05/05/2021. FINDINGS: Brain: Brainstem and cerebellum remain unremarkable except for chronic small-vessel ischemic changes of the pons. Previous craniotomy on the right. Further increase in vasogenic edema at the right frontal parietal junction towards the vertex. Region of enhancement with internal necrosis and restricted diffusion continues to enlarge at the right posterior frontal region, approximate measurements today 2.8 x 3.6 x 2.6 cm compared with 2.6 x 2.3 x 2.8 cm previously. As suggested previously, this relatively rapid change is likely due to radiation necrosis, though the possibility of rapidly progressive aggressive neoplasm is not completely excluded. At the left frontal vertex, and 11 mm in diameter focus of enhancement with surrounding edema has not enlarged, and the amount of regional vasogenic edema is stable. No new lesion is seen. No hydrocephalus. No  extra-axial collection. Vascular: Major vessels at the base of the brain show flow. Skull and upper cervical spine: Negative Sinuses/Orbits: Clear/normal Other: None IMPRESSION: Enlargement of the region of enhancement and necrosis at the right posterior frontal lobe, measuring 2.8 x 3.6 x 2.6 cm today compared with 2.6 x 2.3 x 2.8 cm previously. Increased regional vasogenic edema. 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 Stable 11 mm focus of enhancement and surrounding edema at the left posterior frontal vertex. No new lesion. Electronically Signed   By: Nelson Chimes M.D.   On: 08/31/2021 16:36   Overnight EEG with video  Result Date: 08/31/2021 [name], MD     08/31/2021  2:11 PM Patient Name: Roger Solis MRN: [id number] Epilepsy Attending: [name] Referring Physician/Provider: Kerney Elbe, MD Duration: 08/30/2021 1930 to 08/31/2021 1345 Patient history: 66 year old male with metastatic squamous cell carcinoma of lung with brain mets diagnosed in February of 2022 and focal seizures who presents with complex partial status epilepticus. EEG to evaluate for seizure.  Level of alertness: Lethargic-->awake, asleep AEDs during EEG study: LEV, VPA Technical aspects: This EEG study was done with scalp electrodes positioned according to the 10-20 International system of electrode placement. Electrical activity was acquired at a sampling rate of 500Hz  and reviewed with a high frequency filter of 70Hz  and a low frequency filter of 1Hz . EEG data were recorded continuously and digitally stored. Description: At the beginning of the study, EEG showed sharply contoured 6 to 8 Hz theta and alpha activity which gradually evolved in morphology and frequency consistent with focal seizure.  No clinical signs were seen during this. Average 5 seizures were noted per hour lasting about 40 seconds each. After around [place] on 08/31/2021, as medications were adjusted, frequency of seizure improved.  Last seizure was noted at 0524.  EEG then continued to show brief ictal interictal rhythmic discharges in right temporal region lasting 2 to 5 seconds as well as lateralized periodic discharges with overriding fast activity in right hemisphere, maximal right temporal region at 0.5Hz .EEG also showed continuous generalized and lateralized right hemisphere 3 to 6 Hz theta-delta slowing admixed with 12 to 14 Hz generalized beta activity intermittently. Gradually EEG improved and showed posterior dominant rhythm of 8 Hz activity of moderate voltage (25-35 uV) seen predominantly in posterior head regions, symmetric and reactive to eye opening and eye closing. Sleep was characterized by vertex waves, sleep spindles (12 to 14 Hz), maximal frontocentral region. There was also continuous 3-5hz   theta-delta slowing in right temporal region. Hyperventilation and photic stimulation were not performed.   ABNORMALITY -Focal electrographic seizure, right temporal region -Brief ictal-interictal rhythmic discharges, right temporal region -Lateralized periodic discharges with overriding fast activity, right hemisphere, maximal right temporal region -Continuous slow, generalized and lateralized right hemisphere IMPRESSION: At the beginning of the study, patient was noted to have focal electrographic seizures arising from right temporal region, average 5 seizure per hour lasting about 40 seconds each.  After around [place] on 08/31/2021, frequency of seizures improved.  Last seizure was noted on 08/31/2021 at 0524. EEG also showed evidence of epileptogenicity and cortical dysfunction likely due to underlying structural abnormality and with high potential for seizure recurrence arising from right hemisphere, maximal right temporal region. Additionally there was moderate diffuse encephalopathy, nonspecific etiology but most likely related to seizure. [name]   DG CHEST PORT 1 VIEW  Result Date: 08/30/2021 CLINICAL DATA:  Hypoxemia. EXAM:  PORTABLE CHEST 1 VIEW COMPARISON:  Portable chest 05/19/2021. FINDINGS: There are intact sternotomy sutures with again noted CABG change. There is mild cardiomegaly versus exaggerated heart size due to low inspiration. The mediastinal configuration is stable with aortic tortuosity and calcification. The hypoinflated lungs demonstrate asymmetric streaky left lower lobe retrocardiac opacity which could be atelectasis or pneumonia. There are atelectatic bands in the right base. No pleural effusion is seen. Remaining lungs generally clear. There are degenerative changes of the spine. IMPRESSION: 1. Asymmetric opacity in the left lower lobe which could be atelectasis or pneumonia. 2. Low lung volumes with either exaggerated cardiac size due to hypoinflation or mild cardiomegaly. No  vascular congestion findings. 3. Aortic atherosclerosis. Electronically Signed   By: Telford Nab M.D.   On: 08/30/2021 23:32   CT HEAD CODE STROKE WO CONTRAST  Result Date: 08/30/2021 CLINICAL DATA:  Code stroke. Neuro deficit, acute, stroke suspected. Seizures. Facial twitching and left facial numbness. History of brain tumor. EXAM: CT HEAD WITHOUT CONTRAST TECHNIQUE: Contiguous axial images were obtained from the base of the skull through the vertex without intravenous contrast. RADIATION DOSE REDUCTION: This exam was performed according to the departmental dose-optimization program which includes automated exposure control, adjustment of the mA and/or kV according to patient size and/or use of iterative reconstruction technique. COMPARISON:  Head CT 08/14/2021 and MRI 07/14/2021 FINDINGS: Brain: Confluent hypodensity in the white matter of the posterior right frontal lobe has progressed from the prior CT with extension into the deep white matter tracts at the level of the basal ganglia and extension posteriorly into the right parietal lobe. This has the appearance of vasogenic edema, and there is regional sulcal effacement and mild mass effect on the right lateral ventricle without significant midline shift. A 7 mm hyperdense focus in the right frontal lobe at the site of a ring-enhancing mass on MRI is unchanged and consistent with calcification. A smaller region of edema in the left frontal lobe has also mildly increased. No acute intracranial hemorrhage, acute cortically based infarct, or significant extra-axial fluid collection is identified. Vascular: Calcified atherosclerosis at the skull base. No hyperdense vessel. Skull: Right-sided craniotomy. Sinuses/Orbits: Mild mucosal thickening in the paranasal sinuses. Small left mastoid effusion. Unremarkable orbits. Other: None. ASPECTS [facility] Stroke Program Early CT Score) - Ganglionic level infarction (caudate, lentiform nuclei, internal capsule,  insula, M1-M3 cortex): 7 - Supraganglionic infarction (M4-M6 cortex): 3 Total score (0-10 with 10 being normal): 10 IMPRESSION: 1. No evidence of acute cortically based infarct or intracranial hemorrhage. 2. Increased edema in the right greater than left cerebral hemispheres. No significant midline shift. These results were communicated to Dr. Cheral Marker at 4:29 pm on 08/30/2021 by text page via the [facility] messaging system. Electronically Signed   By: Logan Bores M.D.   On: 08/30/2021 16:29    Disposition:     There are no questions and answers to display.          Allergies as of 09/01/2021   No Known Allergies      Medication List     STOP taking these medications    dexamethasone 1 MG tablet Commonly known as: DECADRON       TAKE these medications    acetaminophen 325 MG tablet Commonly known as: TYLENOL Take 1-2 tablets (325-650 mg total) by mouth every 6 (six) hours as needed for mild pain (pain score 1-3 or temp > 100.5).   albuterol 108 (90 Base) MCG/ACT inhaler Commonly known as: VENTOLIN HFA Inhale 1  puff into the lungs every 6 (six) hours as needed for shortness of breath.   amLODipine 2.5 MG tablet Commonly known as: NORVASC Take 1 tablet (2.5 mg total) by mouth daily.   atorvastatin 80 MG tablet Commonly known as: LIPITOR Take 1 tablet (80 mg total) by mouth daily.   divalproex 250 MG DR tablet Commonly known as: DEPAKOTE Take 3 tablets (750 mg total) by mouth 2 (two) times daily.   docusate sodium 100 MG capsule Commonly known as: COLACE Take 1 capsule (100 mg total) by mouth daily.   gabapentin 300 MG capsule Commonly known as: NEURONTIN Take 1 capsule (300 mg total) by mouth 3 (three) times daily.   HYDROcodone-acetaminophen 5-325 MG tablet Commonly known as: NORCO/VICODIN Take 1-2 tablets by mouth every 4 (four) hours as needed for moderate pain (pain score 4-6).   levETIRAcetam 750 MG tablet Commonly known as: KEPPRA Take 2 tablets (1,500  mg total) by mouth 2 (two) times daily. What changed: how much to take   magnesium gluconate 500 MG tablet Commonly known as: MAGONATE Take 0.5 tablets (250 mg total) by mouth at bedtime.   nicotine 21 mg/24hr patch Commonly known as: NICODERM CQ - dosed in mg/24 hours 21 mg patch daily x2 weeks then 14 mg patch daily x3 weeks then 7 mg patch daily x3 weeks and stop   pantoprazole 40 MG tablet Commonly known as: PROTONIX Take 1 tablet (40 mg total) by mouth daily.   polyethylene glycol 17 g packet Commonly known as: MIRALAX / GLYCOLAX Take 17 g by mouth daily.   Vitamin D (Ergocalciferol) 1.25 MG (50000 UNIT) Caps capsule Commonly known as: DRISDOL Take 1 capsule (50,000 Units total) by mouth every 7 (seven) days.         Follow-up appointment   Primary oncologist   Discharge Condition:    stable  Signed: Rosaisela Jamroz D. Kenton Kingfisher, NP-C  Pulmonary [facility] Care Personal contact information can be found on Amion  09/01/2021, 3:05 PM

## 2021-09-04 ENCOUNTER — Telehealth: Payer: Self-pay

## 2021-09-04 NOTE — Telephone Encounter (Signed)
Roger Solis with [facility] called stating that PT/OT had been finalized, but the pt was hospitalized just after initializing it. She wanted to resume HH.  Reviewed pt's chart, returned Roger Solis's call, two identifiers used. Informed her that there was a PT eval done at d/c stating that they recommending [facility]. She said they would resume asap. Confirmed understanding.

## 2021-09-06 ENCOUNTER — Inpatient Hospital Stay: Payer: No Typology Code available for payment source | Admitting: Registered Nurse

## 2021-09-06 DIAGNOSIS — Z5112 Encounter for antineoplastic immunotherapy: Secondary | ICD-10-CM | POA: Insufficient documentation

## 2021-09-06 DIAGNOSIS — E785 Hyperlipidemia, unspecified: Secondary | ICD-10-CM | POA: Insufficient documentation

## 2021-09-06 DIAGNOSIS — Z461 Encounter for fitting and adjustment of hearing aid: Secondary | ICD-10-CM | POA: Insufficient documentation

## 2021-09-06 DIAGNOSIS — H269 Unspecified cataract: Secondary | ICD-10-CM | POA: Insufficient documentation

## 2021-09-06 DIAGNOSIS — I219 Acute myocardial infarction, unspecified: Secondary | ICD-10-CM | POA: Insufficient documentation

## 2021-09-06 DIAGNOSIS — H6123 Impacted cerumen, bilateral: Secondary | ICD-10-CM | POA: Insufficient documentation

## 2021-09-06 DIAGNOSIS — R609 Edema, unspecified: Secondary | ICD-10-CM | POA: Insufficient documentation

## 2021-09-06 DIAGNOSIS — Z955 Presence of coronary angioplasty implant and graft: Secondary | ICD-10-CM | POA: Insufficient documentation

## 2021-09-06 DIAGNOSIS — I739 Peripheral vascular disease, unspecified: Secondary | ICD-10-CM | POA: Insufficient documentation

## 2021-09-06 DIAGNOSIS — C7931 Secondary malignant neoplasm of brain: Secondary | ICD-10-CM | POA: Insufficient documentation

## 2021-09-06 DIAGNOSIS — Z79899 Other long term (current) drug therapy: Secondary | ICD-10-CM | POA: Insufficient documentation

## 2021-09-06 DIAGNOSIS — J449 Chronic obstructive pulmonary disease, unspecified: Secondary | ICD-10-CM | POA: Insufficient documentation

## 2021-09-07 ENCOUNTER — Ambulatory Visit (INDEPENDENT_AMBULATORY_CARE_PROVIDER_SITE_OTHER): Payer: No Typology Code available for payment source | Admitting: Physician Assistant

## 2021-09-07 VITALS — BP 120/66 | HR 79 | Temp 98.2°F | Resp 20 | Ht 72.0 in | Wt 158.0 lb

## 2021-09-07 DIAGNOSIS — G546 Phantom limb syndrome with pain: Secondary | ICD-10-CM

## 2021-09-07 DIAGNOSIS — Z89612 Acquired absence of left leg above knee: Secondary | ICD-10-CM

## 2021-09-07 MED ORDER — GABAPENTIN 600 MG PO TABS: 600.0000 mg | ORAL_TABLET | Freq: Three times a day (TID) | ORAL | 1 refills | Status: DC

## 2021-09-07 NOTE — Progress Notes (Signed)
POST OPERATIVE OFFICE NOTE    CC:  F/u for surgery  HPI:  This is a 66 y.o. male who is s/p L AKA on 08/06/2021 by Dr. Scot Dock.  The patient was first seen by Dr. Doren Custard on 06/17/2021 for acute on chronic lower extremity ischemia.  The patient developed sudden onset of pain, paresthesias, pallor in the left lower extremity.  His CT angiogram in the hospital revealed severe diffuse multilevel calcific disease, all chronic appearing.  His only potential option would have been a axillobifemoral bypass graft to revascularize the left leg.  However, the patient developed an ulcer of his left heel and gangrene of the foot, which required a left AKA.   The patient apparently does have a history of peripheral arterial disease, possibly a stent in the right superficial femoral artery in [place] in 2015.  He has a history of lung cancer with metastatic disease to the brain.  He is followed at the [place] with his cancer.  He denies any history of diabetes, hypertension, or family history of premature cardiovascular disease.  He used to smoke a pack of cigarettes per day for over 40 years.  He quit smoking a month ago and is now using nicotine patches.  Pt returns today for follow up.  Pt states he has been experiencing severe phantom limb pain constantly.  He states that the pain keeps him up at night.  He also has muscle spasms in the left leg.  He has been taking 300 mg gabapentin 3 times daily, which he states helps him "a little".  Last week he started receiving home health therapy and nurse visits.  He is determined to try to increase his mobility and ensure that he does not lose his right leg.  He denies any bleeding or discharge from the left AKA incision site.  He denies any fevers.  He denies any rest pain of the right leg.  Unable to assess claudication due to the patient's immobility. His daughter has been keeping his right foot clean and protected, to make sure that he does not develop any wounds.    No  Known Allergies  Current Outpatient Medications  Medication Sig Dispense Refill   acetaminophen (TYLENOL) 325 MG tablet Take 1-2 tablets (325-650 mg total) by mouth every 6 (six) hours as needed for mild pain (pain score 1-3 or temp > 100.5).     albuterol (VENTOLIN HFA) 108 (90 Base) MCG/ACT inhaler Inhale 1 puff into the lungs every 6 (six) hours as needed for shortness of breath. 8 g 0   amLODipine (NORVASC) 2.5 MG tablet Take 1 tablet (2.5 mg total) by mouth daily. 30 tablet 0   atorvastatin (LIPITOR) 80 MG tablet Take 1 tablet (80 mg total) by mouth daily. 30 tablet 0   dexamethasone (DECADRON) 2 MG tablet Take 2 tablets (4 mg total) by mouth 2 (two) times daily with a meal. 60 tablet 0   divalproex (DEPAKOTE) 250 MG DR tablet Take 3 tablets (750 mg total) by mouth 2 (two) times daily. 180 tablet 0   docusate sodium (COLACE) 100 MG capsule Take 1 capsule (100 mg total) by mouth daily. 10 capsule 0   gabapentin (NEURONTIN) 300 MG capsule Take 1 capsule (300 mg total) by mouth 3 (three) times daily. 90 capsule 0   HYDROcodone-acetaminophen (NORCO/VICODIN) 5-325 MG tablet Take 1-2 tablets by mouth every 4 (four) hours as needed for moderate pain (pain score 4-6). 30 tablet 0   levETIRAcetam (KEPPRA) 750 MG tablet  Take 2 tablets (1,500 mg total) by mouth 2 (two) times daily. 120 tablet 0   magnesium gluconate (MAGONATE) 500 MG tablet Take 0.5 tablets (250 mg total) by mouth at bedtime. 30 tablet 0   nicotine (NICODERM CQ - DOSED IN MG/24 HOURS) 21 mg/24hr patch 21 mg patch daily x2 weeks then 14 mg patch daily x3 weeks then 7 mg patch daily x3 weeks and stop 28 patch 0   pantoprazole (PROTONIX) 40 MG tablet Take 1 tablet (40 mg total) by mouth daily. (Patient not taking: Reported on 08/31/2021) 30 tablet 0   polyethylene glycol (MIRALAX / GLYCOLAX) 17 g packet Take 17 g by mouth daily. 14 each 0   Vitamin D, Ergocalciferol, (DRISDOL) 1.25 MG (50000 UNIT) CAPS capsule Take 1 capsule (50,000 Units  total) by mouth every 7 (seven) days. 5 capsule 0   No current facility-administered medications for this visit.     ROS:  See HPI  Physical Exam:   Incision:  L AKA incision staples removed at today's visit. No bleeding or discharge from the area. The skin has come together nicely. No swelling or hematoma. The skin is clean, dry, and intact Extremities:  R leg warm and well perfused. Diminished R femoral pulse. Absent L femoral pulse. Non palpable R pedal pulses. No wounds of the RLE.  Neuro: intact. Alert and oriented x3 Abdomen:  soft, nontender    Assessment/Plan:  This is a 67 y.o. male who is s/p: L AKA on 08/06/2021   -The patient is hopeful to increase his mobility so he can keep his right leg. -The patient has been experiencing persistent phantom limb pain that keeps him up at night. He currently takes Gabapentin 300mg  TID. Will increase dose to 600mg  TID and send out prescription. Would appreciate PCP future management. -L AKA healing well, staples have been removed. Patient will keep the area clean and dry. -Patient is currently symptom free in his R leg. No rest pain or nonhealing wounds. His CTA on 06/17/2021 does show diffuse R iliac and common femoral atherosclerotic disease.  -Will schedule follow up in 6 months with ABI to establish baseline. -Continue smoking cessation and atorvastatin. -Will refer to [facility] for prosthesis management.   -The patient has a left Above Knee Amputation. The patient is well motivated to return to their prior functional status by utilizing a prosthesis to perform ADL's and maintain a healthy lifestyle. The patient has the physical and cognitive capacity to function with a prosthesis.    Functional Level: K2 Limited Community Ambulator: Has the ability or potential for ambulation and to traverse low environmental barriers such as curbs, stairs or uneven surfaces   Residual Limb History: The skin condition of the residual limb is good.  The patient will continue to monitor the skin of the residual limb and follow hygiene instructions.  The patient is experiencing phantom limb pain    Prosthetic Prescription Plan: Counseling and education regarding prosthetic management will be provided to the patient via a certified prosthetist. A multi-discipline team, including physical therapy, will manage the prosthetic fabrication, fitting and prosthetic gait training.    Vicente Serene, PA-C [facility] [phone]   Clinic MD:  Scot Dock

## 2021-09-08 ENCOUNTER — Other Ambulatory Visit: Payer: Self-pay

## 2021-09-08 DIAGNOSIS — I739 Peripheral vascular disease, unspecified: Secondary | ICD-10-CM

## 2021-09-08 NOTE — Progress Notes (Signed)
Error

## 2021-09-11 ENCOUNTER — Telehealth: Payer: Self-pay | Admitting: *Deleted

## 2021-09-11 NOTE — Telephone Encounter (Signed)
Daughter called.  Patient was discharged on 09/01/2021 post Status Epilepticus.  Patient was discharged home with increased Keppra dose to 1500 BID, Depakote 750 BID and Decadron 4 mg BID.    MRI Brain was performed.    Physical Therapist is at home today vitals asssessed BP 116/60 HR 73 O2 96%.  Patient had AKA early June to left leg and when attempting to stand blood pressure drops to 98/58 after 5 minutes.   Patient complains of shortness of breath due to coughing.  Patient is reporting nausea/Vomiting.  Unable to keep down zofran.  Excessive Fatigue/dizziness and blurred vision bilaterally that has gotten worse over the past couple of days.  Reports left phatham pain 8 out of 10.    Daughter wants to know if the MRI reading is correct as they were advised radiation necrosis.  Should he be having these worsening symptoms and how to proceed with helping patient.    Routed to provider for response.

## 2021-09-12 ENCOUNTER — Emergency Department (HOSPITAL_COMMUNITY): Payer: No Typology Code available for payment source

## 2021-09-12 ENCOUNTER — Other Ambulatory Visit: Payer: Self-pay

## 2021-09-12 ENCOUNTER — Encounter (HOSPITAL_COMMUNITY): Payer: Self-pay

## 2021-09-12 ENCOUNTER — Telehealth: Payer: Self-pay | Admitting: *Deleted

## 2021-09-12 ENCOUNTER — Inpatient Hospital Stay (HOSPITAL_COMMUNITY)
Admission: EM | Admit: 2021-09-12 | Discharge: 2021-09-15 | DRG: 951 | Disposition: A | Discharge status: Hospice | Payer: No Typology Code available for payment source | Attending: Internal Medicine | Admitting: Internal Medicine

## 2021-09-12 DIAGNOSIS — G4089 Other seizures: Secondary | ICD-10-CM | POA: Diagnosis not present

## 2021-09-12 DIAGNOSIS — T66XXXA Radiation sickness, unspecified, initial encounter: Secondary | ICD-10-CM | POA: Diagnosis present

## 2021-09-12 DIAGNOSIS — Z87891 Personal history of nicotine dependence: Secondary | ICD-10-CM | POA: Diagnosis not present

## 2021-09-12 DIAGNOSIS — I69392 Facial weakness following cerebral infarction: Secondary | ICD-10-CM

## 2021-09-12 DIAGNOSIS — N3001 Acute cystitis with hematuria: Secondary | ICD-10-CM | POA: Diagnosis present

## 2021-09-12 DIAGNOSIS — M79605 Pain in left leg: Secondary | ICD-10-CM | POA: Diagnosis present

## 2021-09-12 DIAGNOSIS — Z89512 Acquired absence of left leg below knee: Secondary | ICD-10-CM

## 2021-09-12 DIAGNOSIS — Y842 Radiological procedure and radiotherapy as the cause of abnormal reaction of the patient, or of later complication, without mention of misadventure at the time of the procedure: Secondary | ICD-10-CM | POA: Diagnosis present

## 2021-09-12 DIAGNOSIS — G936 Cerebral edema: Secondary | ICD-10-CM | POA: Diagnosis present

## 2021-09-12 DIAGNOSIS — Z8673 Personal history of transient ischemic attack (TIA), and cerebral infarction without residual deficits: Secondary | ICD-10-CM

## 2021-09-12 DIAGNOSIS — G40909 Epilepsy, unspecified, not intractable, without status epilepticus: Secondary | ICD-10-CM | POA: Diagnosis present

## 2021-09-12 DIAGNOSIS — I69354 Hemiplegia and hemiparesis following cerebral infarction affecting left non-dominant side: Secondary | ICD-10-CM

## 2021-09-12 DIAGNOSIS — I739 Peripheral vascular disease, unspecified: Secondary | ICD-10-CM | POA: Diagnosis present

## 2021-09-12 DIAGNOSIS — E785 Hyperlipidemia, unspecified: Secondary | ICD-10-CM | POA: Diagnosis present

## 2021-09-12 DIAGNOSIS — Z79899 Other long term (current) drug therapy: Secondary | ICD-10-CM

## 2021-09-12 DIAGNOSIS — C7931 Secondary malignant neoplasm of brain: Secondary | ICD-10-CM | POA: Diagnosis present

## 2021-09-12 DIAGNOSIS — C3490 Malignant neoplasm of unspecified part of unspecified bronchus or lung: Secondary | ICD-10-CM | POA: Diagnosis present

## 2021-09-12 DIAGNOSIS — R4182 Altered mental status, unspecified: Secondary | ICD-10-CM | POA: Diagnosis present

## 2021-09-12 DIAGNOSIS — H269 Unspecified cataract: Secondary | ICD-10-CM | POA: Diagnosis present

## 2021-09-12 DIAGNOSIS — H538 Other visual disturbances: Secondary | ICD-10-CM | POA: Diagnosis present

## 2021-09-12 DIAGNOSIS — Z7189 Other specified counseling: Secondary | ICD-10-CM | POA: Diagnosis not present

## 2021-09-12 DIAGNOSIS — Z820 Family history of epilepsy and other diseases of the nervous system: Secondary | ICD-10-CM

## 2021-09-12 DIAGNOSIS — Z89612 Acquired absence of left leg above knee: Secondary | ICD-10-CM | POA: Diagnosis not present

## 2021-09-12 DIAGNOSIS — T380X5A Adverse effect of glucocorticoids and synthetic analogues, initial encounter: Secondary | ICD-10-CM | POA: Diagnosis present

## 2021-09-12 DIAGNOSIS — I2510 Atherosclerotic heart disease of native coronary artery without angina pectoris: Secondary | ICD-10-CM | POA: Diagnosis present

## 2021-09-12 DIAGNOSIS — Z515 Encounter for palliative care: Secondary | ICD-10-CM | POA: Diagnosis not present

## 2021-09-12 DIAGNOSIS — J449 Chronic obstructive pulmonary disease, unspecified: Secondary | ICD-10-CM | POA: Diagnosis present

## 2021-09-12 DIAGNOSIS — Z66 Do not resuscitate: Secondary | ICD-10-CM | POA: Diagnosis present

## 2021-09-12 DIAGNOSIS — Z951 Presence of aortocoronary bypass graft: Secondary | ICD-10-CM

## 2021-09-12 DIAGNOSIS — I10 Essential (primary) hypertension: Secondary | ICD-10-CM | POA: Diagnosis present

## 2021-09-12 LAB — CBC WITH DIFFERENTIAL/PLATELET
Abs Immature Granulocytes: 0.69 10*3/uL — ABNORMAL HIGH (ref 0.00–0.07)
Basophils Absolute: 0 10*3/uL (ref 0.0–0.1)
Basophils Relative: 0 %
Eosinophils Absolute: 0 10*3/uL (ref 0.0–0.5)
Eosinophils Relative: 0 %
HCT: 38.6 % — ABNORMAL LOW (ref 39.0–52.0)
Hemoglobin: 12.2 g/dL — ABNORMAL LOW (ref 13.0–17.0)
Immature Granulocytes: 4 %
Lymphocytes Relative: 6 %
Lymphs Abs: 0.9 10*3/uL (ref 0.7–4.0)
MCH: 30 pg (ref 26.0–34.0)
MCHC: 31.6 g/dL (ref 30.0–36.0)
MCV: 95.1 fL (ref 80.0–100.0)
Monocytes Absolute: 1 10*3/uL (ref 0.1–1.0)
Monocytes Relative: 6 %
Neutro Abs: 13.5 10*3/uL — ABNORMAL HIGH (ref 1.7–7.7)
Neutrophils Relative %: 84 %
Platelets: 154 10*3/uL (ref 150–400)
RBC: 4.06 MIL/uL — ABNORMAL LOW (ref 4.22–5.81)
RDW: 17 % — ABNORMAL HIGH (ref 11.5–15.5)
WBC: 16.1 10*3/uL — ABNORMAL HIGH (ref 4.0–10.5)
nRBC: 0.1 % (ref 0.0–0.2)

## 2021-09-12 LAB — COMPREHENSIVE METABOLIC PANEL
ALT: 15 U/L (ref 0–44)
AST: 20 U/L (ref 15–41)
Albumin: 2.7 g/dL — ABNORMAL LOW (ref 3.5–5.0)
Alkaline Phosphatase: 45 U/L (ref 38–126)
Anion gap: 17 — ABNORMAL HIGH (ref 5–15)
BUN: 23 mg/dL (ref 8–23)
CO2: 24 mmol/L (ref 22–32)
Calcium: 8.9 mg/dL (ref 8.9–10.3)
Chloride: 102 mmol/L (ref 98–111)
Creatinine, Ser: 0.94 mg/dL (ref 0.61–1.24)
GFR, Estimated: >60 mL/min (ref >60)
Glucose, Bld: 148 mg/dL — ABNORMAL HIGH (ref 70–99)
Potassium: 4 mmol/L (ref 3.5–5.1)
Sodium: 143 mmol/L (ref 135–145)
Total Bilirubin: 0.4 mg/dL (ref 0.3–1.2)
Total Protein: 5.8 g/dL — ABNORMAL LOW (ref 6.5–8.1)

## 2021-09-12 LAB — URINALYSIS, ROUTINE W REFLEX MICROSCOPIC
Bilirubin Urine: NEGATIVE
Glucose, UA: NEGATIVE mg/dL
Ketones, ur: NEGATIVE mg/dL
Nitrite: NEGATIVE
Protein, ur: NEGATIVE mg/dL
Specific Gravity, Urine: 1.018 (ref 1.005–1.030)
pH: 6 (ref 5.0–8.0)

## 2021-09-12 LAB — VALPROIC ACID LEVEL: Valproic Acid Lvl: 47 ug/mL — ABNORMAL LOW (ref 50.0–100.0)

## 2021-09-12 MED ORDER — HYDROCODONE-ACETAMINOPHEN 5-325 MG PO TABS
1.0000 | ORAL_TABLET | ORAL | Status: DC | PRN
  Administered 2021-09-14: 1 via ORAL
  Filled 2021-09-12: qty 1

## 2021-09-12 MED ORDER — ACETAMINOPHEN 650 MG RE SUPP: 650.0000 mg | Freq: Four times a day (QID) | RECTAL | Status: DC | PRN

## 2021-09-12 MED ORDER — LEVETIRACETAM 500 MG PO TABS
1500.0000 mg | ORAL_TABLET | Freq: Two times a day (BID) | ORAL | Status: DC
  Administered 2021-09-13 – 2021-09-15 (×5): 1500 mg via ORAL
  Filled 2021-09-12 (×5): qty 3

## 2021-09-12 MED ORDER — GABAPENTIN 600 MG PO TABS
600.0000 mg | ORAL_TABLET | Freq: Three times a day (TID) | ORAL | Status: DC
  Administered 2021-09-12 – 2021-09-15 (×8): 600 mg via ORAL
  Filled 2021-09-12 (×8): qty 1

## 2021-09-12 MED ORDER — DEXAMETHASONE SODIUM PHOSPHATE 4 MG/ML IJ SOLN
4.0000 mg | Freq: Once | INTRAMUSCULAR | Status: AC
  Administered 2021-09-12: 4 mg via INTRAVENOUS
  Filled 2021-09-12: qty 1

## 2021-09-12 MED ORDER — DIVALPROEX SODIUM 250 MG PO DR TAB
750.0000 mg | DELAYED_RELEASE_TABLET | Freq: Once | ORAL | Status: AC
  Administered 2021-09-12: 750 mg via ORAL
  Filled 2021-09-12: qty 3

## 2021-09-12 MED ORDER — LEVETIRACETAM 500 MG PO TABS
1500.0000 mg | ORAL_TABLET | Freq: Two times a day (BID) | ORAL | Status: DC
Start: 2021-09-12 — End: 2021-09-12

## 2021-09-12 MED ORDER — PANTOPRAZOLE SODIUM 40 MG PO TBEC
40.0000 mg | DELAYED_RELEASE_TABLET | Freq: Every day | ORAL | Status: DC
  Administered 2021-09-13 – 2021-09-15 (×3): 40 mg via ORAL
  Filled 2021-09-12 (×3): qty 1

## 2021-09-12 MED ORDER — GADOBUTROL 1 MMOL/ML IV SOLN
7.5000 mL | Freq: Once | INTRAVENOUS | Status: AC | PRN
  Administered 2021-09-12: 7.5 mL via INTRAVENOUS

## 2021-09-12 MED ORDER — ACETAMINOPHEN 325 MG PO TABS: 650.0000 mg | ORAL_TABLET | Freq: Four times a day (QID) | ORAL | Status: DC | PRN

## 2021-09-12 MED ORDER — NICOTINE 14 MG/24HR TD PT24
14.0000 mg | MEDICATED_PATCH | Freq: Every day | TRANSDERMAL | Status: DC
Start: 2021-09-13 — End: 2021-09-15
  Administered 2021-09-13 – 2021-09-15 (×3): 14 mg via TRANSDERMAL
  Filled 2021-09-12 (×3): qty 1

## 2021-09-12 MED ORDER — DEXAMETHASONE 4 MG PO TABS
4.0000 mg | ORAL_TABLET | Freq: Four times a day (QID) | ORAL | Status: DC
  Administered 2021-09-12 – 2021-09-15 (×10): 4 mg via ORAL
  Filled 2021-09-12 (×13): qty 1

## 2021-09-12 MED ORDER — LEVETIRACETAM 500 MG PO TABS
1500.0000 mg | ORAL_TABLET | Freq: Once | ORAL | Status: AC
  Administered 2021-09-12: 1500 mg via ORAL
  Filled 2021-09-12: qty 3

## 2021-09-12 MED ORDER — DIVALPROEX SODIUM 250 MG PO DR TAB
750.0000 mg | DELAYED_RELEASE_TABLET | Freq: Two times a day (BID) | ORAL | Status: DC
  Administered 2021-09-13 – 2021-09-15 (×5): 750 mg via ORAL
  Filled 2021-09-12 (×5): qty 3

## 2021-09-12 MED ORDER — ENOXAPARIN SODIUM 40 MG/0.4ML IJ SOSY
40.0000 mg | PREFILLED_SYRINGE | Freq: Every day | INTRAMUSCULAR | Status: DC
  Administered 2021-09-12: 40 mg via SUBCUTANEOUS
  Filled 2021-09-12: qty 0.4

## 2021-09-12 MED ORDER — POLYETHYLENE GLYCOL 3350 17 G PO PACK: 17.0000 g | PACK | Freq: Every day | ORAL | Status: DC | PRN

## 2021-09-12 MED ORDER — AMLODIPINE BESYLATE 5 MG PO TABS
2.5000 mg | ORAL_TABLET | Freq: Every day | ORAL | Status: DC
Start: 2021-09-13 — End: 2021-09-13
  Administered 2021-09-13: 2.5 mg via ORAL
  Filled 2021-09-12: qty 1

## 2021-09-12 MED ORDER — SODIUM CHLORIDE 0.9 % IV SOLN
1.0000 g | Freq: Once | INTRAVENOUS | Status: AC
  Administered 2021-09-12: 1 g via INTRAVENOUS
  Filled 2021-09-12: qty 10

## 2021-09-12 NOTE — ED Notes (Signed)
Patient transported to MRI 

## 2021-09-12 NOTE — Hospital Course (Signed)
Albuterol Atorvastatin 80 mg Ceflex Cilostazol 100 mg  Dexamethasone Doxycyline keppra 500 mg BID Nicontin patch Gabapentin 100 mg TICinhalers Zofran Pantoproalole

## 2021-09-12 NOTE — H&P (Incomplete)
Date: 09/12/2021               Patient Name:  Roger Solis MRN: [id number]  DOB: 07-Apr-1955 Age / Sex: 66 y.o., male   PCP: [facility]         Medical Service: Internal Medicine Teaching Service         Attending Physician: Dr. Sherwood Gambler, MD    First Contact: *** Pager: ***  Second Contact: *** Pager: ***       After Hours (After 5p/  First Contact Pager: [phone]  weekends / holidays): Second Contact Pager: [phone]   SUBJECTIVE   Chief Complaint: blurry vision, weakness, malaise  History of Present Illness: *** 7 YOM with hx of SCC of lung diagnosed 2020 with brain mets diagnosed 2022 s/p partial resection, chemo, radiation, and immunotherapy complicated by post-radiation partial brain necrosis (confirmed by stereotactic brain biopsy of right frontal lobe 05/23/21), seizures, PAD s/p left AKA (08/06/21), HTN, HLD, and CAD presented with progressive decline over past 2 days.  Patient reports blurring of his vision bilaterally onset two days ago. No visual field defects reported though he report dark spots that migrate through visual field. He is not having any pain or itching in eyes. No difficulty moving eyes. No headache. Saw his ophthalmologist at the [place] earlier today who did not see any ophthalmological etiology to explain his vision changes and referred him to the ED for further workup.  Patient also reports progressive malaise, generalized weakness, and nausea, but has not vomited. Denies any dizziness/vertigo-like symptoms. Not having any abd pain. He has not had a bowel movement in 6 days, but reports this is normal for him. He notes that it has been more difficult to sit up recently and seems to be having more trouble with balance, which he believes is due to his AKA. Chronic left sided weakness, but no new acute focal weakness. Endorses worsened phantom limb pain and states that his daughter has not been allowing him to take his pain medicines.  He denies any  fevers or chills. No chest pain, or worsening SOB, though he states he is mostly sedentary and does not exert himself enough to notice exertional dyspnea. He has COPD and produces clear sputum normally, reports no change from baseline. While in the emergency department, UA indicative of infection, though he denies any urinary symptoms.  Numbness foot and coldness always been there    Of note, patient was recently admitted to [facility] on  6/28 for complex partial status epilepticus involving left face and LUE.     ED Course:  UTI, Also noted to have 16 wbc. got rocephin Head CT ? Changes Oncologist ([place] of neuro-oncologist) recommended repeat MRI and increase decadron to 4mg  QID from BID. Oncology will follow along.    Meds:  No outpatient medications have been marked as taking for the 09/12/21 encounter [facility] Encounter).    Past Medical History:  Diagnosis Date  . CAD (coronary artery disease)   . History of brain cancer   . HLD (hyperlipidemia)   . Hypertension   . PAD (peripheral artery disease) ([place])     Past Surgical History:  Procedure Laterality Date  . AMPUTATION Left 08/06/2021   Procedure: LEFTAMPUTATION ABOVE KNEE;  Surgeon: Angelia Mould, MD;  Location: [place];  Service: Vascular;  Laterality: Left;  . APPLICATION OF CRANIAL NAVIGATION N/A 05/23/2021   Procedure: APPLICATION OF CRANIAL NAVIGATION;  Surgeon: Kristeen Miss, MD;  Location: [place];  Service:  Neurosurgery;  Laterality: N/A;  . double bypass  2012  . FRAMELESS  BIOPSY WITH BRAINLAB N/A 05/23/2021   Procedure: Stereotactic needle biopsy of the brain w/brainlab;  Surgeon: Kristeen Miss, MD;  Location: [place];  Service: Neurosurgery;  Laterality: N/A;    Social:  Lives With:daughter, son in Sports coach and granddaughter Occupation: truck Geophysicist/field seismologist, retired Support:  Level of Function: PCP: Substances: Former smoker, quit about 1 month ago. 1.5 ppd for 40 years (60 pack years), Used to drink 2-3 drinks, and  stopped about 7 years ago. )  No drug use.   Family History: Father with alzheimer  Allergies: Allergies as of 09/12/2021  . (No Known Allergies)    Review of Systems: A complete ROS was negative except as per HPI.   OBJECTIVE:   Physical Exam: Blood pressure 140/82, pulse 68, temperature 98 F (36.7 C), temperature source Oral, resp. rate 20, height 6\' 1"  (1.854 m), weight 77.1 kg, SpO2 96 %.  Constitutional: well-appearing *** sitting in ***, in no acute distress HENT: normocephalic atraumatic, mucous membranes moist Eyes: conjunctiva non-erythematous Neck: supple Cardiovascular: regular rate and rhythm, no m/r/g Pulmonary/Chest: normal work of breathing on room air, lungs clear to auscultation bilaterally Abdominal: soft, non-tender, non-distended MSK: normal bulk and tone Neurological: alert & oriented x 3, 5/5 strength in bilateral upper and lower extremities, normal gait Skin: warm and dry Psych: ***  Labs: CBC    Component Value Date/Time   WBC 16.1 (H) 09/12/2021 1411   RBC 4.06 (L) 09/12/2021 1411   HGB 12.2 (L) 09/12/2021 1411   HCT 38.6 (L) 09/12/2021 1411   PLT 154 09/12/2021 1411   MCV 95.1 09/12/2021 1411   MCH 30.0 09/12/2021 1411   MCHC 31.6 09/12/2021 1411   RDW 17.0 (H) 09/12/2021 1411   LYMPHSABS 0.9 09/12/2021 1411   MONOABS 1.0 09/12/2021 1411   EOSABS 0.0 09/12/2021 1411   BASOSABS 0.0 09/12/2021 1411     CMP     Component Value Date/Time   NA 143 09/12/2021 1411   K 4.0 09/12/2021 1411   CL 102 09/12/2021 1411   CO2 24 09/12/2021 1411   GLUCOSE 148 (H) 09/12/2021 1411   BUN 23 09/12/2021 1411   CREATININE 0.94 09/12/2021 1411   CALCIUM 8.9 09/12/2021 1411   PROT 5.8 (L) 09/12/2021 1411   ALBUMIN 2.7 (L) 09/12/2021 1411   AST 20 09/12/2021 1411   ALT 15 09/12/2021 1411   ALKPHOS 45 09/12/2021 1411   BILITOT 0.4 09/12/2021 1411   GFRNONAA >60 09/12/2021 1411    Imaging: CT Head Wo Contrast  Result Date: 09/12/2021 CLINICAL  DATA:  Mental status changes of unknown cause. History of CNS cancer, prior surgery and radiation therapy EXAM: CT HEAD WITHOUT CONTRAST TECHNIQUE: Contiguous axial images were obtained from the base of the skull through the vertex without intravenous contrast. RADIATION DOSE REDUCTION: This exam was performed according to the departmental dose-optimization program which includes automated exposure control, adjustment of the mA and/or kV according to patient size and/or use of iterative reconstruction technique. COMPARISON:  08/30/2021 FINDINGS: Brain: Prior RIGHT temporoparietal craniotomy. Generalized atrophy. Large area of vasogenic edema again identified within the RIGHT frontal lobe. Small hyperdense focus RIGHT frontal subcortical white matter 7 mm diameter, unchanged, likely reflects known tumor. Overlying dural thickening at the craniotomy site. Small vessel chronic ischemic changes of deep cerebral white matter. No new intracranial hemorrhage, mass, or infarct. No new extra-axial collections. Vascular: No hyperdense vessels. Extensive atherosclerotic calcification of internal  carotid arteries and RIGHT vertebral artery at skull base. Skull: Post craniotomy changes RIGHT temporoparietal. No acute osseous findings. Sinuses/Orbits: Clear Other: N/A IMPRESSION: Again identified large area of white matter hypoattenuation consistent with vasogenic edema in RIGHT frontal lobe. 7 mm hyperdense focus RIGHT frontal lobe little changed from previous exam, likely known tumor. Postsurgical changes RIGHT craniotomy. No new intracranial abnormalities. Electronically Signed   By: Lavonia Dana M.D.   On: 09/12/2021 14:51    EKG: personally reviewed my interpretation is***   ASSESSMENT & PLAN:    Assessment & Plan by Problem: Active Problems:   * No active hospital problems. *   Koleman Marling is a 66 y.o. with pertinent PMH of *** who presented with *** and admitted for *** on hospital day 0  SCC lung with  brain mets -   Hx PAD s/p BKA (08/06/21)   Diet: {NAMES:3044014::"Normal","Heart Healthy","Carb-Modified","Renal","Carb/Renal","NPO","TPN","Tube Feeds"} VTE: {NAMES:3044014::"Heparin","Enoxaparin","SCDs","NOAC","None"} IVF: {NAMES:3044014::"None","NS","1/2 NS","LR","D5","D10"},{NAMES:3044014::"None","10cc/hr","25cc/hr","50cc/hr","75cc/hr","100cc/hr","110cc/hr","125cc/hr","Bolus"} Code: {NAMES:3044014::"Full","DNR","DNI","DNR/DNI","Comfort Care","Unknown"}  Prior to Admission Living Arrangement: {NAMES:3044014::"Home, living ***","SNF, ***","Homeless","***"} Anticipated Discharge Location: {NAMES:3044014::"Home","SNF","CIR","***"} Barriers to Discharge: ***  Dispo: Admit patient to {STATUS:3044014::"Observation with expected length of stay less than 2 midnights.","Inpatient with expected length of stay greater than 2 midnights."}  Signed: Delene Ruffini, MD Internal Medicine Resident PGY-1 09/12/2021, 7:09 PM

## 2021-09-12 NOTE — ED Notes (Signed)
Only blue top culture bottle collected at this time due to the patient being a difficult stick will try to collect second set as soon as possible

## 2021-09-12 NOTE — ED Triage Notes (Signed)
Pt arrived POV from home c/o AMS x2 days. Per family pt is confused, blurred vision and not acting right,

## 2021-09-12 NOTE — ED Provider Triage Note (Signed)
Emergency Medicine Provider Triage Evaluation Note  Roger Solis , a 66 y.o. male  was evaluated in triage.  Pt is accompanied by her his eldest daughter who states that he has had altered mental status changes for about 2 to 3 days.  Patient has history of lung cancer with metastasis to the brain and necrosis around the tumor in the brain.  He is also had vision changes and they called his doctor who follows him for his brain tumor and they advised him to go see an eye doctor.  They went to an ophthalmologist with the [facility] in [place] area today.  He was noted to have 8020 vision, no papilledema, ocular pressures of 20 and 21 respectively.  Overall, no changes that could explain why he is having the sudden changes in vision.  They referred him to the emergency department.  Daughter states that he has been more confused lately and she has noted "puffiness" on the left side of his face.  Patient denies chest pain, shortness of breath, abdominal pain, nausea, vomiting and diarrhea.  Review of Systems  Positive:  Negative:   Physical Exam  BP 119/60 (BP Location: Left Arm)   Pulse 81   Temp 98 F (36.7 C) (Oral)   Resp 20   Ht 6\' 1"  (1.854 m)   Wt 77.1 kg   SpO2 98%   BMI 22.43 kg/m  Gen:   Awake, no distress   Resp:  Normal effort  MSK:   Moves extremities without difficulty  Other:  Strong and equal grip strength bilaterally, pupils are dilated from seeing the eye doctor earlier today.  Medical Decision Making  Medically screening exam initiated at 2:03 PM.  Appropriate orders placed.  [name] was informed that the remainder of the evaluation will be completed by another provider, this initial triage assessment does not replace that evaluation, and the importance of remaining in the ED until their evaluation is complete.     Tonye Pearson, [place] 09/12/21 1405

## 2021-09-12 NOTE — ED Provider Notes (Signed)
[facility] EMERGENCY DEPARTMENT Provider Note   CSN: [id number] Arrival date & time: 09/12/21  1313     History  Chief Complaint  Patient presents with   Altered Mental Status    Roger Solis is a 66 y.o. male.  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  Patient is brought in with his daughter today with concern for decline over the past 2 days.  He notes that he has had progressively worsening blurry vision in both of his eyes as well as losing the will to live, generalized weakness and feeling poor.  He reports normal p.o. intake.  Reports bowel habits to be normal and states he has not had a bowel movement in several days which is typical for him.  He denies fevers, chills.  Denies any changes to his cough or mucus production.  He has had some nausea and vomiting for the past 2 days.  Patient is managed by the [facility] as well as neuro-oncology at this hospital.  Patient went to his ophthalmologist at the [facility] today who evaluated him and noted his vision to be 20/80, IOP at 20 and 22.  There was no explanation found for his blurry vision and he was deferred to the emergency room for further work-up.       Home Medications Prior to Admission medications   Medication Sig Start Date End Date Taking? Authorizing Provider  acetaminophen (TYLENOL) 325 MG tablet Take 1-2 tablets (325-650 mg total) by mouth every 6 (six) hours as needed for mild pain (pain score 1-3 or temp > 100.5). 08/18/21   Angiulli, Lavon Paganini, PA-C  albuterol (VENTOLIN HFA) 108 (90 Base) MCG/ACT inhaler Inhale 1 puff into the lungs every 6  (six) hours as needed for shortness of breath. 08/18/21   Angiulli, Lavon Paganini, PA-C  amLODipine (NORVASC) 2.5 MG tablet Take 1 tablet (2.5 mg total) by mouth daily. 08/18/21   Angiulli, Lavon Paganini, PA-C  atorvastatin (LIPITOR) 80 MG tablet Take 1 tablet (80 mg total) by mouth daily. 08/18/21   Angiulli, Lavon Paganini, PA-C  dexamethasone (DECADRON) 2 MG tablet Take 2 tablets (4 mg total) by mouth 2 (two) times daily with a meal. 09/01/21   Gerald Leitz D, NP  divalproex (DEPAKOTE) 250 MG DR tablet Take 3 tablets (750 mg total) by mouth 2 (two) times daily. 09/01/21   Gerald Leitz D, NP  docusate sodium (COLACE) 100 MG capsule Take 1 capsule (100 mg total) by mouth daily. 08/18/21   Angiulli, Lavon Paganini, PA-C  gabapentin (NEURONTIN) 600 MG tablet Take 1 tablet (600 mg total) by mouth 3 (three) times daily. 09/07/21   Schuh, McKenzi P, PA-C  HYDROcodone-acetaminophen (NORCO/VICODIN) 5-325 MG tablet Take 1-2 tablets by mouth every 4 (four) hours as needed for moderate pain (pain score 4-6). 08/18/21   Angiulli, Lavon Paganini, PA-C  levETIRAcetam (KEPPRA) 750 MG tablet Take 2 tablets (1,500 mg total) by mouth 2 (two) times daily. 09/01/21   Gerald Leitz D, NP  magnesium gluconate (MAGONATE) 500 MG tablet Take 0.5 tablets (250 mg total) by mouth at bedtime. 08/18/21   Angiulli, Lavon Paganini, PA-C  nicotine (NICODERM CQ - DOSED  IN MG/24 HOURS) 21 mg/24hr patch 21 mg patch daily x2 weeks then 14 mg patch daily x3 weeks then 7 mg patch daily x3 weeks and stop 08/18/21   Angiulli, Lavon Paganini, PA-C  pantoprazole (PROTONIX) 40 MG tablet Take 1 tablet (40 mg total) by mouth daily. 08/18/21   Angiulli, Lavon Paganini, PA-C  polyethylene glycol (MIRALAX / GLYCOLAX) 17 g packet Take 17 g by mouth daily. 08/18/21   Angiulli, Lavon Paganini, PA-C  Vitamin D, Ergocalciferol, (DRISDOL) 1.25 MG (50000 UNIT) CAPS capsule Take 1 capsule (50,000 Units total) by mouth every 7 (seven) days. 08/24/21   Angiulli, Lavon Paganini, PA-C      Allergies    Patient has no  known allergies.    Review of Systems   Review of Systems Negative except as per HPI Physical Exam Updated Vital Signs BP (!) 144/71   Pulse 71   Temp 98 F (36.7 C) (Oral)   Resp 18   Ht 6\' 1"  (1.854 m)   Wt 77.1 kg   SpO2 95%   BMI 22.43 kg/m  Physical Exam Vitals and nursing note reviewed.  Constitutional:      General: He is not in acute distress.    Appearance: He is well-developed. He is not diaphoretic.  HENT:     Head: Normocephalic and atraumatic.     Mouth/Throat:     Mouth: Mucous membranes are moist.     Comments: Question thrush, notes his throat is a little irritated Eyes:     Extraocular Movements: Extraocular movements intact.     Pupils: Pupils are equal, round, and reactive to light.  Cardiovascular:     Rate and Rhythm: Regular rhythm. Bradycardia present.     Heart sounds: Normal heart sounds.     Comments: Absent right pedal pulse, reported to be baseline Pulmonary:     Effort: Pulmonary effort is normal.     Comments: Coarse lung sounds in left base Abdominal:     Palpations: Abdomen is soft.     Tenderness: There is no abdominal tenderness.  Musculoskeletal:     Cervical back: Neck supple.     Right lower leg: No edema.     Comments: Left AKA  Skin:    General: Skin is warm and dry.     Findings: No erythema or rash.  Neurological:     Mental Status: He is alert and oriented to person, place, and time.     Cranial Nerves: No cranial nerve deficit.     Sensory: No sensory deficit.     Motor: No weakness.  Psychiatric:        Behavior: Behavior normal.     ED Results / Procedures / Treatments   Labs (all labs ordered are listed, but only abnormal results are displayed) Labs Reviewed  COMPREHENSIVE METABOLIC PANEL - Abnormal; Notable for the following components:      Result Value   Glucose, Bld 148 (*)    Total Protein 5.8 (*)    Albumin 2.7 (*)    Anion gap 17 (*)    All other components within normal limits  CBC WITH  DIFFERENTIAL/PLATELET - Abnormal; Notable for the following components:   WBC 16.1 (*)    RBC 4.06 (*)    Hemoglobin 12.2 (*)    HCT 38.6 (*)    RDW 17.0 (*)    Neutro Abs 13.5 (*)    Abs Immature Granulocytes 0.69 (*)    All other components within normal limits  URINALYSIS, ROUTINE W REFLEX MICROSCOPIC - Abnormal; Notable for the following components:   APPearance HAZY (*)    Hgb urine dipstick SMALL (*)    Leukocytes,Ua MODERATE (*)    Bacteria, UA MANY (*)    All other components within normal limits  VALPROIC ACID LEVEL - Abnormal; Notable for the following components:   Valproic Acid Lvl 47 (*)    All other components within normal limits  CULTURE, BLOOD (ROUTINE X 2)  CULTURE, BLOOD (ROUTINE X 2)  URINE CULTURE    EKG None  Radiology DG Chest Port 1 View  Result Date: 09/12/2021 CLINICAL DATA:  A 66 year old male presents for evaluation due to cough. Reported history of lung and colon cancer. EXAM: PORTABLE CHEST 1 VIEW COMPARISON:  August 30, 2021. FINDINGS: EKG leads project over the chest. Post median sternotomy. Cardiomediastinal contours and hilar structures are stable. No lobar consolidation. No sign of pleural effusion. No pneumothorax. Interstitial prominence seen on the previous study is diminished. On limited assessment no acute skeletal process. IMPRESSION: 1. No acute cardiopulmonary disease. 2. Interstitial prominence seen on the previous study is diminished. Electronically Signed   By: Zetta Bills M.D.   On: 09/12/2021 19:11   CT Head Wo Contrast  Result Date: 09/12/2021 CLINICAL DATA:  Mental status changes of unknown cause. History of CNS cancer, prior surgery and radiation therapy EXAM: CT HEAD WITHOUT CONTRAST TECHNIQUE: Contiguous axial images were obtained from the base of the skull through the vertex without intravenous contrast. RADIATION DOSE REDUCTION: This exam was performed according to the departmental dose-optimization program which includes  automated exposure control, adjustment of the mA and/or kV according to patient size and/or use of iterative reconstruction technique. COMPARISON:  08/30/2021 FINDINGS: Brain: Prior RIGHT temporoparietal craniotomy. Generalized atrophy. Large area of vasogenic edema again identified within the RIGHT frontal lobe. Small hyperdense focus RIGHT frontal subcortical white matter 7 mm diameter, unchanged, likely reflects known tumor. Overlying dural thickening at the craniotomy site. Small vessel chronic ischemic changes of deep cerebral white matter. No new intracranial hemorrhage, mass, or infarct. No new extra-axial collections. Vascular: No hyperdense vessels. Extensive atherosclerotic calcification of internal carotid arteries and RIGHT vertebral artery at skull base. Skull: Post craniotomy changes RIGHT temporoparietal. No acute osseous findings. Sinuses/Orbits: Clear Other: N/A IMPRESSION: Again identified large area of white matter hypoattenuation consistent with vasogenic edema in RIGHT frontal lobe. 7 mm hyperdense focus RIGHT frontal lobe little changed from previous exam, likely known tumor. Postsurgical changes RIGHT craniotomy. No new intracranial abnormalities. Electronically Signed   By: Lavonia Dana M.D.   On: 09/12/2021 14:51    Procedures Procedures    Medications Ordered in ED Medications  cefTRIAXone (ROCEPHIN) 1 g in sodium chloride 0.9 % 100 mL IVPB (0 g Intravenous Stopped 09/12/21 1938)  divalproex (DEPAKOTE) DR tablet 750 mg (750 mg Oral Given 09/12/21 1943)  levETIRAcetam (KEPPRA) tablet 1,500 mg (1,500 mg Oral Given 09/12/21 1944)  dexamethasone (DECADRON) injection 4 mg (4 mg Intravenous Given 09/12/21 1944)  gadobutrol (GADAVIST) 1 MMOL/ML injection 7.5 mL (7.5 mLs Intravenous Contrast Given 09/12/21 2047)    ED Course/ Medical Decision Making/ A&P                           Medical Decision Making Amount and/or Complexity of Data Reviewed Labs: ordered.   This patient  presents to the ED for concern of Blurry vision, feeling poorly for the past 2 days., this involves an  extensive number of treatment options, and is a complaint that carries with it a high risk of complications and morbidity.  The differential diagnosis includes but not limited to worsening metastatic process, UTI, PNA, dehydration, metabolic disturbance    Co morbidities that complicate the patient evaluation  Lung cancer with mets to brain, seizures, CVA, CAD, hypertension, PAD, MI   Additional history obtained:  Additional history obtained from daughter at bedside who provides history for presentation today External records from outside source obtained and reviewed including discharge summary from recent admission on 09/01/2021, admitted for focal status epilepticus, stabilized on medications and ultimately discharged home   Lab Tests:  I Ordered, and personally interpreted labs.  The pertinent results include: See BC with white count elevated at 16.1 with increase in neutrophils.  CMP without significant findings or changes from baseline.  Urinalysis with small hemoglobin, moderate leukocytes with 21-50 white cells and many bacteria, will add on culture.  Depakote subtherapeutic at 47.   Imaging Studies ordered:  I ordered imaging studies including CT head, chest x-ray I independently visualized and interpreted imaging which showed chest x-ray negative for acute disease.  CT head as read by radiology with little change from prior exam noted I agree with the radiologist interpretation   Cardiac Monitoring: / EKG:  The patient was maintained on a cardiac monitor.  I personally viewed and interpreted the cardiac monitored which showed an underlying rhythm of: Normal sinus rhythm, rate 78   Consultations Obtained:  I requested consultation with the oncology, Dr. Mickeal Skinner,  and discussed lab and imaging findings as well as pertinent plan - they recommend: Increase Decadron to 4 mg 4  times daily.  Also recommend repeat contrast MRI of the brain due to CT changes today.  Agree with admission to hospital.   Problem List / ED Course / Critical interventions / Medication management  66 year old male with complaint of blurry vision with fatigue and feeling poorly for the past 2 days. Complex medical history. Found to have UTI, plan to treat with Rocephin and admit to hospitalist.  Discussed with patient's oncologist with visual disturbance and slight change on CT today, recommends MRI brain with and without contrast, increase in Decadron from 4 mg twice daily to 4 times daily.  Discussed results and plan of care with patient and daughter who verbalized understanding. I ordered medication including Rocephin for UTI Reevaluation of the patient after these medicines showed that the patient stayed the same I have reviewed the patients home medicines and have made adjustments as needed   Social Determinants of Health:  Lives at home, has primary care through the [place], followed by neurooncology at this hospital   Test / Admission - Considered:  Plan is to admit for visual changes with concern for UTI         Final Clinical Impression(s) / ED Diagnoses Final diagnoses:  Acute cystitis with hematuria  Blurry vision  Primary malignant neoplasm of lung metastatic to other site, unspecified laterality [facility])    Rx / DC Orders ED Discharge Orders     None         Roque Lias 09/12/21 2103    Sherwood Gambler, MD 09/13/21 1504

## 2021-09-12 NOTE — ED Notes (Signed)
Phlebotomy made aware of the patient's needs. Phlebotomy and this RN will get to the lab orders as soon as physically possible due to the high demands of the ER.

## 2021-09-12 NOTE — ED Notes (Signed)
Pt returned from MRI °

## 2021-09-12 NOTE — Telephone Encounter (Signed)
Daughter called to report that patient was evaluated at [facility] today and was advised that there should not be any reason why he should be losing his vision so they advised that he go to the emergency room.

## 2021-09-12 NOTE — H&P (Incomplete)
Date: 09/13/2021               Patient Name:  Roger Solis MRN: [id number]  DOB: Jul 17, 1955 Age / Sex: 66 y.o., male   PCP: [facility]         Medical Service: Internal Medicine Teaching Service         Attending Physician: Dr. Charise Killian, MD    First Contact: Romana Juniper, MD Pager: [phone]  Second Contact: Virl Axe, MD Pager: [phone]       After Hours (After 5p/  First Contact Pager: [phone]  weekends / holidays): Second Contact Pager: [phone]   SUBJECTIVE   Chief Complaint: blurry vision, weakness, malaise  History of Present Illness:  14 YOM with hx of SCC of lung diagnosed 2020 with brain mets diagnosed 2022 s/p partial resection, chemo, immunotherapy and radiation complicated by CVA/post-radiation partial brain necrosis (confirmed by stereotactic brain biopsy of right frontal lobe 05/23/21) with residual left sided facial droop and left sided weakness, seizures, PAD s/p left AKA (08/06/21), HTN, HLD, and CAD presented with progressive decline over past 2 days.  Patient reports blurring of his vision bilaterally onset two days ago.  No visual field defects reported though he report dark spots that migrate through visual field. No progression of symptoms. He is not having any pain or itching in eyes. No difficulty moving eyes. No headache. Saw his optometry at the [place] earlier today who did not see any ophthalmological etiology to explain his vision changes and referred him to the ED for further workup.  Patient also reports progressive malaise, generalized weakness, and nausea. Patient initially reported vomiting but later denied this. Denies any dizziness/vertigo-like symptoms. Not having any abd pain. He has not had a bowel movement in 6 days, but reports this is normal for him. He notes that it has been more difficult to sit up recently and seems to be having more trouble with balance, which he believes is due to his AKA. Chronic left sided weakness, but no  new acute focal weakness. Endorses worsened phantom limb pain and states that his daughter has not been allowing him to take his pain medicines. Has not choked on any food. He denies any fevers or chills. No chest pain, or worsening SOB, though he states he is mostly sedentary and does not exert himself enough to notice exertional dyspnea. He has COPD and produces clear sputum normally, reports no change from baseline. While in the emergency department, UA indicative of infection, though he denies any urinary symptoms.  Numbness foot and coldness is chronic and he reports no changes to this.  Of note, patient was recently admitted to [facility] ICU on  6/28 for complex partial status epilepticus involving left face and LUE. MRI brain obtained during that hospitalization showed regional vasogenic edema likely from his radiation necrosis. Discharged on his decadron and Depakote. Diazepam was recommended for breakthrough sz, but was not started as family wished to f/u with primary oncologist first.      ED Course: While in the ED, pt's primary oncologist, Dr. Mickeal Skinner, was contacted and recommend repeat of head MRI, which showed edema and mild mass effect, and increasing pt's decadron to 4mg  QID from BID. They plan on following along.    Meds:  No outpatient medications have been marked as taking for the 09/12/21 encounter [facility]counter).    Past Medical History:  Diagnosis Date   CAD (coronary artery disease)    History of brain cancer  HLD (hyperlipidemia)    Hypertension    PAD (peripheral artery disease) [facility])     Past Surgical History:  Procedure Laterality Date   AMPUTATION Left 08/06/2021   Procedure: LEFTAMPUTATION ABOVE KNEE;  Surgeon: Angelia Mould, MD;  Location: [place];  Service: Vascular;  Laterality: Left;   APPLICATION OF CRANIAL NAVIGATION N/A 05/23/2021   Procedure: APPLICATION OF CRANIAL NAVIGATION;  Surgeon: Kristeen Miss, MD;  Location: [place];  Service: Neurosurgery;   Laterality: N/A;   double bypass  2012   FRAMELESS  BIOPSY WITH BRAINLAB N/A 05/23/2021   Procedure: Stereotactic needle biopsy of the brain w/brainlab;  Surgeon: Kristeen Miss, MD;  Location: [place];  Service: Neurosurgery;  Laterality: N/A;    Social:  Lives With: daughter, son in Sports coach and granddaughter Occupation: truck Geophysicist/field seismologist, retired Support: family Level of Function: independent with [place], [facility] PCP: [facility] Substances: Former smoker, quit about 1 month ago. 1.5 ppd for 40 years (60 pack years), Used to drink 2-3 drinks, and stopped about 7 years ago. )  No drug use.   Family History: Father with alzheimer  Allergies: Allergies as of 09/12/2021   (No Known Allergies)    Review of Systems: A complete ROS was negative except as per HPI.   OBJECTIVE:   Physical Exam: Blood pressure 140/82, pulse 68, temperature 98 F (36.7 C), temperature source Oral, resp. rate 20, height 6\' 1"  (1.854 m), weight 77.1 kg, SpO2 96 %.  Constitutional: no acute distress HENT: normocephalic atraumatic, mucous membranes moist Eyes: conjunctiva non-erythematous, no surrounding edema or erythema Neck: supple Cardiovascular: regular rate and rhythm, no m/r/g, unable to palpate RLE pulse, cool lower extremity Pulmonary/Chest: normal work of breathing on room air, lungs clear to auscultation bilaterally Abdominal: soft, non-tender, non-distended MSK: normal bulk and tone, left AKA Neurological: alert & oriented x 3, left sided facial droop with mild dysarthria, unable to keep cheeks puffed. EOM intact, no obvious visual field defects, normal finger counting on exam, sensation grossly intact, 5/5 strength right, 4+/5 left Skin: cool LE, dry Psych: down/depressed  Labs: CBC    Component Value Date/Time   WBC 12.9 (H) 09/13/2021 0537   RBC 3.99 (L) 09/13/2021 0537   HGB 12.0 (L) 09/13/2021 0537   HCT 37.7 (L) 09/13/2021 0537   PLT 143 (L) 09/13/2021 0537   MCV 94.5 09/13/2021 0537    MCH 30.1 09/13/2021 0537   MCHC 31.8 09/13/2021 0537   RDW 17.2 (H) 09/13/2021 0537   LYMPHSABS 0.9 09/12/2021 1411   MONOABS 1.0 09/12/2021 1411   EOSABS 0.0 09/12/2021 1411   BASOSABS 0.0 09/12/2021 1411     CMP     Component Value Date/Time   NA 141 09/13/2021 0537   K 4.3 09/13/2021 0537   CL 106 09/13/2021 0537   CO2 26 09/13/2021 0537   GLUCOSE 77 09/13/2021 0537   BUN 23 09/13/2021 0537   CREATININE 0.77 09/13/2021 0537   CALCIUM 8.3 (L) 09/13/2021 0537   PROT 5.8 (L) 09/12/2021 1411   ALBUMIN 2.7 (L) 09/12/2021 1411   AST 20 09/12/2021 1411   ALT 15 09/12/2021 1411   ALKPHOS 45 09/12/2021 1411   BILITOT 0.4 09/12/2021 1411   GFRNONAA >60 09/13/2021 0537    Imaging: MR BRAIN W WO CONTRAST  Result Date: 09/12/2021 CLINICAL DATA:  Initial evaluation for metastatic lung cancer, blurry vision. EXAM: MRI HEAD WITHOUT AND WITH CONTRAST TECHNIQUE: Multiplanar, multiecho pulse sequences of the brain and surrounding structures were obtained without and  with intravenous contrast. CONTRAST:  7.81mL GADAVIST GADOBUTROL 1 MMOL/ML IV SOLN COMPARISON:  MRI from 08/31/2021. FINDINGS: Brain: Postoperative changes from prior right-sided craniotomy. Underlying irregular enhancing area at the level of the right frontal operculum measures 3.5 x 2.7 x 2.5 cm. Surrounding vasogenic edema throughout the adjacent right cerebral hemisphere. Mild mass effect on the right lateral ventricle without significant midline shift. Additional enhancing lesion at the contralateral left frontal lobe measures 1.3 x 0.9 x 1.2 cm. More mild localized vasogenic edema about this lesion. Overall, these findings are not significantly changed or progressed from prior. No new lesions identified. Underlying atrophy with chronic microvascular ischemic disease noted. No evidence for acute or subacute infarct. No other mass lesion. No hydrocephalus. No extra-axial fluid collection. Pituitary gland within normal limits. Vascular:  Major intracranial vascular flow voids are maintained. Skull and upper cervical spine: Craniocervical junction within normal limits. Bone marrow signal intensity normal. No focal marrow replacing lesion. Prior right craniotomy. Sinuses/Orbits: Globes and orbital soft tissues demonstrate no acute finding. Scattered mucosal thickening noted about the ethmoidal air cells and maxillary sinuses. Trace left mastoid effusion, of doubtful significance. Other: None. IMPRESSION: 1. No significant interval change in size and appearance of enhancing areas/lesions involving the right frontal operculum and contralateral left frontal lobe since 08/31/2021. Surrounding vasogenic edema with mild mass effect without significant midline shift. No new lesions. 2. No other acute intracranial abnormality. Electronically Signed   By: Jeannine Boga M.D.   On: 09/12/2021 22:22   DG Chest Port 1 View  Result Date: 09/12/2021 CLINICAL DATA:  A 66 year old male presents for evaluation due to cough. Reported history of lung and colon cancer. EXAM: PORTABLE CHEST 1 VIEW COMPARISON:  August 30, 2021. FINDINGS: EKG leads project over the chest. Post median sternotomy. Cardiomediastinal contours and hilar structures are stable. No lobar consolidation. No sign of pleural effusion. No pneumothorax. Interstitial prominence seen on the previous study is diminished. On limited assessment no acute skeletal process. IMPRESSION: 1. No acute cardiopulmonary disease. 2. Interstitial prominence seen on the previous study is diminished. Electronically Signed   By: Zetta Bills M.D.   On: 09/12/2021 19:11   CT Head Wo Contrast  Result Date: 09/12/2021 CLINICAL DATA:  Mental status changes of unknown cause. History of CNS cancer, prior surgery and radiation therapy EXAM: CT HEAD WITHOUT CONTRAST TECHNIQUE: Contiguous axial images were obtained from the base of the skull through the vertex without intravenous contrast. RADIATION DOSE REDUCTION:  This exam was performed according to the departmental dose-optimization program which includes automated exposure control, adjustment of the mA and/or kV according to patient size and/or use of iterative reconstruction technique. COMPARISON:  08/30/2021 FINDINGS: Brain: Prior RIGHT temporoparietal craniotomy. Generalized atrophy. Large area of vasogenic edema again identified within the RIGHT frontal lobe. Small hyperdense focus RIGHT frontal subcortical white matter 7 mm diameter, unchanged, likely reflects known tumor. Overlying dural thickening at the craniotomy site. Small vessel chronic ischemic changes of deep cerebral white matter. No new intracranial hemorrhage, mass, or infarct. No new extra-axial collections. Vascular: No hyperdense vessels. Extensive atherosclerotic calcification of internal carotid arteries and RIGHT vertebral artery at skull base. Skull: Post craniotomy changes RIGHT temporoparietal. No acute osseous findings. Sinuses/Orbits: Clear Other: N/A IMPRESSION: Again identified large area of white matter hypoattenuation consistent with vasogenic edema in RIGHT frontal lobe. 7 mm hyperdense focus RIGHT frontal lobe little changed from previous exam, likely known tumor. Postsurgical changes RIGHT craniotomy. No new intracranial abnormalities. Electronically Signed   By: Elta Guadeloupe  Thornton Papas M.D.   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 He was noted to have cataracts, but this wound not cause acute change. Blood pressure has been stable and glucose wnl that would explain visual changes. - MR brain showed no interval change in size or appearance of lesion or enhancing areas. Did note surrounding vasogenic edema with mild mass effect. It is possible that mass effect is impacting his vision as no other etiology has been identified at this point. - immune therapy for metastatic was stopped end of 2022 due to need to give steroids for brain swelling and inability to give the two together. ?Too immunosuppressive - case was discussed with pt's oncologist, Dr. Mickeal Skinner who recommended MRI and increasing decadron to 4mg  4 times daily. We will monitor for improvement in his symptoms with increase in steroids.  - continue Depakote 750mg  BID and Keppra 1500mg  BID - Can use Ativan for breakthrough seizures  # COPD # Squamous cell carcinoma of lung with metastasis s/p chemo, radiation, immunotherapy - Follows with Dr. Mickeal Skinner. Appears as though pt was unable to complete lung cancer treatment as immunotherapy had to be paused. - unable to complete immune therapy 2/2 decadron initiation.  - No evidence of COPD exacerbation or worsened pulmonary status on exam.  - albuterol PRN   # PAD s/p BKA (08/06/21) # CAD with hx CABG 2012 - he is not on any medicines for his PAD due to brain mets and concern for intracranial hemorrhage. He appears to have previously been on aspirin and plavix for CAD and CABG, however these appear to have been discontinued some time ago for unclear reasons, though presumably due to concern for intracranial hemorrhage as well - RLE chronically cool, numb. This is unchanged.  - ABI ordered -  Gabapentin for neuropathic pain  # HTN - Stable - Amlodipine 2.5 daily.  # GOC - patient voices his frustration over continued decline these past several months. He is interested in speaking with palliative care team to establish Esto. - consult to palliative.   VTE prophylaxis - patient is higher risk for dvt given his underlying pro-inflammatory state d/t malignancy. SCD's may not be sufficient for DVT prophylaxis, however, pharmacologic prophylaxis is complicated by his brain necrosis. He received heparin during his last hospitalization. Pharmacy has been consulted to assist with this.  - Per pharmacy, SCDs recommended for VTE prophylaxis for now given high risk of intracranial hemorrhage. Can discuss with oncology in AM regarding pharmacologic options.   Diet: Heart Healthy VTE: SCDs IVF: None,None Code: DNR  Prior to Admission Living Arrangement: Home, living family Anticipated Discharge Location: Home Barriers to Discharge: medical stability  Dispo: Admit patient to Inpatient with expected length of stay greater than 2 midnights.  Signed: Delene Ruffini, MD Internal Medicine Resident PGY-1 09/13/2021, 6:41 AM

## 2021-09-13 ENCOUNTER — Inpatient Hospital Stay (HOSPITAL_COMMUNITY): Payer: No Typology Code available for payment source

## 2021-09-13 DIAGNOSIS — I739 Peripheral vascular disease, unspecified: Secondary | ICD-10-CM | POA: Diagnosis not present

## 2021-09-13 DIAGNOSIS — Z7189 Other specified counseling: Secondary | ICD-10-CM | POA: Diagnosis not present

## 2021-09-13 DIAGNOSIS — R4182 Altered mental status, unspecified: Secondary | ICD-10-CM | POA: Diagnosis not present

## 2021-09-13 DIAGNOSIS — H538 Other visual disturbances: Secondary | ICD-10-CM | POA: Diagnosis not present

## 2021-09-13 DIAGNOSIS — C7931 Secondary malignant neoplasm of brain: Secondary | ICD-10-CM | POA: Diagnosis not present

## 2021-09-13 DIAGNOSIS — C3490 Malignant neoplasm of unspecified part of unspecified bronchus or lung: Secondary | ICD-10-CM

## 2021-09-13 DIAGNOSIS — G4089 Other seizures: Secondary | ICD-10-CM | POA: Diagnosis not present

## 2021-09-13 LAB — BASIC METABOLIC PANEL
Anion gap: 9 (ref 5–15)
BUN: 23 mg/dL (ref 8–23)
CO2: 26 mmol/L (ref 22–32)
Calcium: 8.3 mg/dL — ABNORMAL LOW (ref 8.9–10.3)
Chloride: 106 mmol/L (ref 98–111)
Creatinine, Ser: 0.77 mg/dL (ref 0.61–1.24)
GFR, Estimated: >60 mL/min (ref >60)
Glucose, Bld: 77 mg/dL (ref 70–99)
Potassium: 4.3 mmol/L (ref 3.5–5.1)
Sodium: 141 mmol/L (ref 135–145)

## 2021-09-13 LAB — CBC
HCT: 37.7 % — ABNORMAL LOW (ref 39.0–52.0)
Hemoglobin: 12 g/dL — ABNORMAL LOW (ref 13.0–17.0)
MCH: 30.1 pg (ref 26.0–34.0)
MCHC: 31.8 g/dL (ref 30.0–36.0)
MCV: 94.5 fL (ref 80.0–100.0)
Platelets: 143 10*3/uL — ABNORMAL LOW (ref 150–400)
RBC: 3.99 MIL/uL — ABNORMAL LOW (ref 4.22–5.81)
RDW: 17.2 % — ABNORMAL HIGH (ref 11.5–15.5)
WBC: 12.9 10*3/uL — ABNORMAL HIGH (ref 4.0–10.5)
nRBC: 0 % (ref 0.0–0.2)

## 2021-09-13 MED ORDER — ONDANSETRON 4 MG PO TBDP: 4.0000 mg | ORAL_TABLET | Freq: Four times a day (QID) | ORAL | Status: DC | PRN

## 2021-09-13 MED ORDER — GLYCOPYRROLATE 0.2 MG/ML IJ SOLN: 0.2000 mg | INTRAMUSCULAR | Status: DC | PRN

## 2021-09-13 MED ORDER — MORPHINE SULFATE (PF) 2 MG/ML IV SOLN
1.0000 mg | INTRAVENOUS | Status: DC | PRN
  Administered 2021-09-13 – 2021-09-14 (×2): 1 mg via INTRAVENOUS
  Filled 2021-09-13 (×2): qty 1

## 2021-09-13 MED ORDER — BIOTENE DRY MOUTH MT LIQD: 15.0000 mL | OROMUCOSAL | Status: DC | PRN

## 2021-09-13 MED ORDER — LORAZEPAM 2 MG/ML IJ SOLN
1.0000 mg | INTRAMUSCULAR | Status: DC | PRN
  Administered 2021-09-13 – 2021-09-15 (×2): 1 mg via INTRAVENOUS
  Filled 2021-09-13 (×2): qty 1

## 2021-09-13 MED ORDER — SENNA 8.6 MG PO TABS: 1.0000 | ORAL_TABLET | Freq: Every evening | ORAL | Status: DC | PRN

## 2021-09-13 MED ORDER — ALBUTEROL SULFATE (2.5 MG/3ML) 0.083% IN NEBU: 2.5000 mg | INHALATION_SOLUTION | Freq: Four times a day (QID) | RESPIRATORY_TRACT | Status: DC | PRN

## 2021-09-13 MED ORDER — ONDANSETRON HCL 4 MG/2ML IJ SOLN: 4.0000 mg | Freq: Four times a day (QID) | INTRAMUSCULAR | Status: DC | PRN

## 2021-09-13 MED ORDER — POLYVINYL ALCOHOL 1.4 % OP SOLN: 1.0000 [drp] | Freq: Four times a day (QID) | OPHTHALMIC | Status: DC | PRN

## 2021-09-13 MED ORDER — MORPHINE SULFATE (CONCENTRATE) 10 MG/0.5ML PO SOLN: 5.0000 mg | ORAL | Status: DC | PRN

## 2021-09-13 MED ORDER — LORAZEPAM 2 MG/ML PO CONC: 1.0000 mg | ORAL | Status: DC | PRN

## 2021-09-13 MED ORDER — LORAZEPAM 1 MG PO TABS: 1.0000 mg | ORAL_TABLET | ORAL | Status: DC | PRN

## 2021-09-13 MED ORDER — GLYCOPYRROLATE 1 MG PO TABS: 1.0000 mg | ORAL_TABLET | ORAL | Status: DC | PRN

## 2021-09-13 NOTE — ED Notes (Signed)
ED TO INPATIENT HANDOFF REPORT  ED Nurse Name and Phone #:     S Name/Age/Gender Roger Solis 66 y.o. male Room/Bed: H015C/H015C  Code Status   Code Status: DNR  Home/SNF/Other Home Patient oriented to: self and place Is this baseline? Yes   Triage Complete: Triage complete  Chief Complaint Altered mental state [R41.82]  Triage Note Pt arrived POV from home c/o AMS x2 days. Per family pt is confused, blurred vision and not acting right,    Allergies No Known Allergies  Level of Care/Admitting Diagnosis ED Disposition     ED Disposition  Admit   Condition  --   Hospers: [facility] [[id number]]  Level of Care: Med-Surg [16]  May admit patient to [facility] or [facility] if equivalent level of care is available:: Yes  Covid Evaluation: Asymptomatic - no recent exposure (last 10 days) testing not required  Diagnosis: Altered mental state [218921]  Admitting Physician: Charise Killian [[id number]]  Attending Physician: Charise Killian [[id number]]  Certification:: I certify this patient will need inpatient services for at least 2 midnights  Estimated Length of Stay: 2          B Medical/Surgery History Past Medical History:  Diagnosis Date   CAD (coronary artery disease)    History of brain cancer    HLD (hyperlipidemia)    Hypertension    PAD (peripheral artery disease) ([place])    Past Surgical History:  Procedure Laterality Date   AMPUTATION Left 08/06/2021   Procedure: LEFTAMPUTATION ABOVE KNEE;  Surgeon: Angelia Mould, MD;  Location: [facility];  Service: Vascular;  Laterality: Left;   APPLICATION OF CRANIAL NAVIGATION N/A 05/23/2021   Procedure: APPLICATION OF CRANIAL NAVIGATION;  Surgeon: Kristeen Miss, MD;  Location: [facility];  Service: Neurosurgery;  Laterality: N/A;   double bypass  2012   FRAMELESS  BIOPSY WITH BRAINLAB N/A 05/23/2021   Procedure: Stereotactic needle biopsy of the brain w/brainlab;  Surgeon: Kristeen Miss, MD;   Location: [facility];  Service: Neurosurgery;  Laterality: N/A;     A IV Location/Drains/Wounds Patient Lines/Drains/Airways Status     Active Line/Drains/Airways     Name Placement date Placement time Site Days   Peripheral IV 08/30/21 18 G Right Antecubital 08/30/21  --  Antecubital  14   Peripheral IV 08/30/21 20 G Distal;Posterior;Left Forearm 08/30/21  --  Forearm  14   External Urinary Catheter 08/31/21  0100  --  13   Incision (Closed) 08/06/21 Leg 08/06/21  0829  -- 38   Pressure Injury 08/16/21 Buttocks Right Stage 2 -  Partial thickness loss of dermis presenting as a shallow open injury with a red, pink wound bed without slough. 08/16/21  1452  -- 28            Intake/Output Last 24 hours No intake or output data in the 24 hours ending 09/13/21 1648  Labs/Imaging Results for orders placed or performed during the hospital encounter of 09/12/21 (from the past 48 hour(s))  Comprehensive metabolic panel     Status: Abnormal   Collection Time: 09/12/21  2:11 PM  Result Value Ref Range   Sodium 143 135 - 145 mmol/L   Potassium 4.0 3.5 - 5.1 mmol/L   Chloride 102 98 - 111 mmol/L   CO2 24 22 - 32 mmol/L   Glucose, Bld 148 (H) 70 - 99 mg/dL    Comment: Glucose reference range applies only to samples taken after fasting for at least  8 hours.   BUN 23 8 - 23 mg/dL   Creatinine, Ser 0.94 0.61 - 1.24 mg/dL   Calcium 8.9 8.9 - 10.3 mg/dL   Total Protein 5.8 (L) 6.5 - 8.1 g/dL   Albumin 2.7 (L) 3.5 - 5.0 g/dL   AST 20 15 - 41 U/L   ALT 15 0 - 44 U/L   Alkaline Phosphatase 45 38 - 126 U/L   Total Bilirubin 0.4 0.3 - 1.2 mg/dL   GFR, Estimated >60 >60 mL/min    Comment: (NOTE) Calculated using the CKD-EPI Creatinine Equation (2021)    Anion gap 17 (H) 5 - 15    Comment: Performed at [facility], [place] [address], [place], [place] [address]  CBC with Differential     Status: Abnormal   Collection Time: 09/12/21  2:11 PM  Result Value Ref Range   WBC 16.1 (H) 4.0 - 10.5  K/uL   RBC 4.06 (L) 4.22 - 5.81 MIL/uL   Hemoglobin 12.2 (L) 13.0 - 17.0 g/dL   HCT 38.6 (L) 39.0 - 52.0 %   MCV 95.1 80.0 - 100.0 fL   MCH 30.0 26.0 - 34.0 pg   MCHC 31.6 30.0 - 36.0 g/dL   RDW 17.0 (H) 11.5 - 15.5 %   Platelets 154 150 - 400 K/uL   nRBC 0.1 0.0 - 0.2 %   Neutrophils Relative % 84 %   Neutro Abs 13.5 (H) 1.7 - 7.7 K/uL   Lymphocytes Relative 6 %   Lymphs Abs 0.9 0.7 - 4.0 K/uL   Monocytes Relative 6 %   Monocytes Absolute 1.0 0.1 - 1.0 K/uL   Eosinophils Relative 0 %   Eosinophils Absolute 0.0 0.0 - 0.5 K/uL   Basophils Relative 0 %   Basophils Absolute 0.0 0.0 - 0.1 K/uL   Immature Granulocytes 4 %   Abs Immature Granulocytes 0.69 (H) 0.00 - 0.07 K/uL    Comment: Performed at [facility] [address], [place], [place] [address]  Urinalysis, Routine w reflex microscopic Urine, Clean Catch     Status: Abnormal   Collection Time: 09/12/21  2:12 PM  Result Value Ref Range   Color, Urine YELLOW YELLOW   APPearance HAZY (A) CLEAR   Specific Gravity, Urine 1.018 1.005 - 1.030   pH 6.0 5.0 - 8.0   Glucose, UA NEGATIVE NEGATIVE mg/dL   Hgb urine dipstick SMALL (A) NEGATIVE   Bilirubin Urine NEGATIVE NEGATIVE   Ketones, ur NEGATIVE NEGATIVE mg/dL   Protein, ur NEGATIVE NEGATIVE mg/dL   Nitrite NEGATIVE NEGATIVE   Leukocytes,Ua MODERATE (A) NEGATIVE   RBC / HPF 0-5 0 - 5 RBC/hpf   WBC, UA 21-50 0 - 5 WBC/hpf   Bacteria, UA MANY (A) NONE SEEN   Mucus PRESENT     Comment: Performed at [facility], [address] [place], [place] [address]  Culture, blood (routine x 2)     Status: None (Preliminary result)   Collection Time: 09/12/21  5:19 PM   Specimen: BLOOD  Result Value Ref Range   Specimen Description BLOOD SITE NOT SPECIFIED    Special Requests      BOTTLES DRAWN AEROBIC ONLY Blood Culture adequate volume   Culture      NO GROWTH < 12 HOURS Performed at [facility], [place] [address] [place], [place] [address]    Report Status  PENDING   Valproic acid level     Status: Abnormal  Collection Time: 09/12/21  7:48 PM  Result Value Ref Range   Valproic Acid Lvl 47 (L) 50.0 - 100.0 ug/mL    Comment: Performed at [facility] [address], [place], [place] [address]  Basic metabolic panel     Status: Abnormal   Collection Time: 09/13/21  5:37 AM  Result Value Ref Range   Sodium 141 135 - 145 mmol/L   Potassium 4.3 3.5 - 5.1 mmol/L   Chloride 106 98 - 111 mmol/L   CO2 26 22 - 32 mmol/L   Glucose, Bld 77 70 - 99 mg/dL    Comment: Glucose reference range applies only to samples taken after fasting for at least 8 hours.   BUN 23 8 - 23 mg/dL   Creatinine, Ser 0.77 0.61 - 1.24 mg/dL   Calcium 8.3 (L) 8.9 - 10.3 mg/dL   GFR, Estimated >60 >60 mL/min    Comment: (NOTE) Calculated using the CKD-EPI Creatinine Equation (2021)    Anion gap 9 5 - 15    Comment: Performed at [facility] [address], [place], [place] [address]  CBC     Status: Abnormal   Collection Time: 09/13/21  5:37 AM  Result Value Ref Range   WBC 12.9 (H) 4.0 - 10.5 K/uL   RBC 3.99 (L) 4.22 - 5.81 MIL/uL   Hemoglobin 12.0 (L) 13.0 - 17.0 g/dL   HCT 37.7 (L) 39.0 - 52.0 %   MCV 94.5 80.0 - 100.0 fL   MCH 30.1 26.0 - 34.0 pg   MCHC 31.8 30.0 - 36.0 g/dL   RDW 17.2 (H) 11.5 - 15.5 %   Platelets 143 (L) 150 - 400 K/uL   nRBC 0.0 0.0 - 0.2 %    Comment: Performed at [facility], [place] [address] [place], [place] [address]   VAS [place] ABI WITH/WO TBI  Result Date: 09/13/2021  LOWER EXTREMITY DOPPLER STUDY Patient Name:  Roger Solis  Date of Exam:   09/13/2021 Medical Rec #: [id number]        Accession #:    [id number] Date of Birth: [birth year]         Patient Gender: M Patient Age:   49 years Exam Location:  [facility] Procedure:      VAS [place] ABI WITH/WO TBI Referring Phys: GRACE LAU --------------------------------------------------------------------------------  Indications: Peripheral artery disease.  Comparison        No recent previous exam found. Last noted is 2015 with Rt ABI Study:           0.8. Performing Technologist: Bobetta Lime BS RVT  Examination Guidelines: A complete evaluation includes at minimum, Doppler waveform signals and systolic blood pressure reading at the level of bilateral brachial, anterior tibial, and posterior tibial arteries, when vessel segments are accessible. Bilateral testing is considered an integral part of a complete examination. Photoelectric Plethysmograph (PPG) waveforms and toe systolic pressure readings are included as required and additional duplex testing as needed. Limited examinations for reoccurring indications may be performed as noted.  ABI Findings: +---------+------------------+-----+----------+--------+ Right    Rt Pressure (mmHg)IndexWaveform  Comment  +---------+------------------+-----+----------+--------+ Brachial 137                    triphasic          +---------+------------------+-----+----------+--------+ PTA      83                0.58 monophasic         +---------+------------------+-----+----------+--------+  PERO     92                0.64 monophasic         +---------+------------------+-----+----------+--------+ Great Toe60                0.42                    +---------+------------------+-----+----------+--------+ +--------+------------------+-----+---------+-------+ Left    Lt Pressure (mmHg)IndexWaveform Comment +--------+------------------+-----+---------+-------+ [id number]                    triphasic        +--------+------------------+-----+---------+-------+ +-------+-----------+-----------+------------+------------+ ABI/TBIToday's ABIToday's TBIPrevious ABIPrevious TBI +-------+-----------+-----------+------------+------------+ Right  .64        0.42                                +-------+-----------+-----------+------------+------------+ Left AKA.  Summary: Right: Resting right ankle-brachial  index indicates moderate right lower extremity arterial disease. The right toe-brachial index is abnormal. Left: Left AKA. *See table(s) above for measurements and observations.     Preliminary    MR BRAIN W WO CONTRAST  Result Date: 09/12/2021 CLINICAL DATA:  Initial evaluation for metastatic lung cancer, blurry vision. EXAM: MRI HEAD WITHOUT AND WITH CONTRAST TECHNIQUE: Multiplanar, multiecho pulse sequences of the brain and surrounding structures were obtained without and with intravenous contrast. CONTRAST:  7.41mL GADAVIST GADOBUTROL 1 MMOL/ML IV SOLN COMPARISON:  MRI from 08/31/2021. FINDINGS: Brain: Postoperative changes from prior right-sided craniotomy. Underlying irregular enhancing area at the level of the right frontal operculum measures 3.5 x 2.7 x 2.5 cm. Surrounding vasogenic edema throughout the adjacent right cerebral hemisphere. Mild mass effect on the right lateral ventricle without significant midline shift. Additional enhancing lesion at the contralateral left frontal lobe measures 1.3 x 0.9 x 1.2 cm. More mild localized vasogenic edema about this lesion. Overall, these findings are not significantly changed or progressed from prior. No new lesions identified. Underlying atrophy with chronic microvascular ischemic disease noted. No evidence for acute or subacute infarct. No other mass lesion. No hydrocephalus. No extra-axial fluid collection. Pituitary gland within normal limits. Vascular: Major intracranial vascular flow voids are maintained. Skull and upper cervical spine: Craniocervical junction within normal limits. Bone marrow signal intensity normal. No focal marrow replacing lesion. Prior right craniotomy. Sinuses/Orbits: Globes and orbital soft tissues demonstrate no acute finding. Scattered mucosal thickening noted about the ethmoidal air cells and maxillary sinuses. Trace left mastoid effusion, of doubtful significance. Other: None. IMPRESSION: 1. No significant interval change in  size and appearance of enhancing areas/lesions involving the right frontal operculum and contralateral left frontal lobe since 08/31/2021. Surrounding vasogenic edema with mild mass effect without significant midline shift. No new lesions. 2. No other acute intracranial abnormality. Electronically Signed   By: Jeannine Boga M.D.   On: 09/12/2021 22:22   DG Chest Port 1 View  Result Date: 09/12/2021 CLINICAL DATA:  A 66 year old male presents for evaluation due to cough. Reported history of lung and colon cancer. EXAM: PORTABLE CHEST 1 VIEW COMPARISON:  August 30, 2021. FINDINGS: EKG leads project over the chest. Post median sternotomy. Cardiomediastinal contours and hilar structures are stable. No lobar consolidation. No sign of pleural effusion. No pneumothorax. Interstitial prominence seen on the previous study is diminished. On limited assessment no acute skeletal process. IMPRESSION: 1. No acute cardiopulmonary disease. 2. Interstitial prominence seen on the previous study is diminished. Electronically Signed  By: Zetta Bills M.D.   On: 09/12/2021 19:11   CT Head Wo Contrast  Result Date: 09/12/2021 CLINICAL DATA:  Mental status changes of unknown cause. History of CNS cancer, prior surgery and radiation therapy EXAM: CT HEAD WITHOUT CONTRAST TECHNIQUE: Contiguous axial images were obtained from the base of the skull through the vertex without intravenous contrast. RADIATION DOSE REDUCTION: This exam was performed according to the departmental dose-optimization program which includes automated exposure control, adjustment of the mA and/or kV according to patient size and/or use of iterative reconstruction technique. COMPARISON:  08/30/2021 FINDINGS: Brain: Prior RIGHT temporoparietal craniotomy. Generalized atrophy. Large area of vasogenic edema again identified within the RIGHT frontal lobe. Small hyperdense focus RIGHT frontal subcortical white matter 7 mm diameter, unchanged, likely reflects  known tumor. Overlying dural thickening at the craniotomy site. Small vessel chronic ischemic changes of deep cerebral white matter. No new intracranial hemorrhage, mass, or infarct. No new extra-axial collections. Vascular: No hyperdense vessels. Extensive atherosclerotic calcification of internal carotid arteries and RIGHT vertebral artery at skull base. Skull: Post craniotomy changes RIGHT temporoparietal. No acute osseous findings. Sinuses/Orbits: Clear Other: N/A IMPRESSION: Again identified large area of white matter hypoattenuation consistent with vasogenic edema in RIGHT frontal lobe. 7 mm hyperdense focus RIGHT frontal lobe little changed from previous exam, likely known tumor. Postsurgical changes RIGHT craniotomy. No new intracranial abnormalities. Electronically Signed   By: Lavonia Dana M.D.   On: 09/12/2021 14:51    Pending Labs Unresulted Labs (From admission, onward)     Start     Ordered   09/12/21 1719  Culture, blood (routine x 2)  BLOOD CULTURE X 2,   R (with STAT occurrences)      09/12/21 1719   09/12/21 1719  Urine Culture  Add-on,   AD       Question:  Indication  Answer:  Altered mental status (if no other cause identified)   09/12/21 1719            Vitals/Pain Today's Vitals   09/13/21 0230 09/13/21 1003 09/13/21 1342 09/13/21 1445  BP: 121/65 (!) 159/73  122/62  Pulse: 77 80  76  Resp: 18 18  18   Temp:      TempSrc:      SpO2: 94% 94%  96%  Weight:      Height:      PainSc: 0-No pain  Asleep Asleep    Isolation Precautions No active isolations  Medications Medications  dexamethasone (DECADRON) tablet 4 mg (4 mg Oral Given 09/13/21 1342)  acetaminophen (TYLENOL) tablet 650 mg (has no administration in time range)    Or  acetaminophen (TYLENOL) suppository 650 mg (has no administration in time range)  polyethylene glycol (MIRALAX / GLYCOLAX) packet 17 g (has no administration in time range)  nicotine (NICODERM CQ - dosed in mg/24 hours) patch 14 mg  (14 mg Transdermal Patch Applied 09/13/21 1002)  divalproex (DEPAKOTE) DR tablet 750 mg (750 mg Oral Given 09/13/21 1002)  gabapentin (NEURONTIN) tablet 600 mg (600 mg Oral Given 09/13/21 1003)  HYDROcodone-acetaminophen (NORCO/VICODIN) 5-325 MG per tablet 1 tablet (has no administration in time range)  pantoprazole (PROTONIX) EC tablet 40 mg (40 mg Oral Given 09/13/21 1002)  levETIRAcetam (KEPPRA) tablet 1,500 mg (1,500 mg Oral Given 09/13/21 1003)  albuterol (PROVENTIL) (2.5 MG/3ML) 0.083% nebulizer solution 2.5 mg (has no administration in time range)  ondansetron (ZOFRAN-ODT) disintegrating tablet 4 mg (has no administration in time range)    Or  ondansetron (ZOFRAN)  injection 4 mg (has no administration in time range)  glycopyrrolate (ROBINUL) tablet 1 mg (has no administration in time range)    Or  glycopyrrolate (ROBINUL) injection 0.2 mg (has no administration in time range)    Or  glycopyrrolate (ROBINUL) injection 0.2 mg (has no administration in time range)  antiseptic oral rinse (BIOTENE) solution 15 mL (has no administration in time range)  polyvinyl alcohol (LIQUIFILM TEARS) 1.4 % ophthalmic solution 1 drop (has no administration in time range)  morphine CONCENTRATE 10 MG/0.5ML oral solution 5 mg (has no administration in time range)    Or  morphine CONCENTRATE 10 MG/0.5ML oral solution 5 mg (has no administration in time range)  morphine (PF) 2 MG/ML injection 1 mg (1 mg Intravenous Given 09/13/21 1123)  LORazepam (ATIVAN) tablet 1 mg ( Oral See Alternative 09/13/21 1121)    Or  LORazepam (ATIVAN) 2 MG/ML concentrated solution 1 mg ( Sublingual See Alternative 09/13/21 1121)    Or  LORazepam (ATIVAN) injection 1 mg (1 mg Intravenous Given 09/13/21 1121)  senna (SENOKOT) tablet 8.6 mg (has no administration in time range)  cefTRIAXone (ROCEPHIN) 1 g in sodium chloride 0.9 % 100 mL IVPB (0 g Intravenous Stopped 09/12/21 1938)  divalproex (DEPAKOTE) DR tablet 750 mg (750 mg Oral Given  09/12/21 1943)  levETIRAcetam (KEPPRA) tablet 1,500 mg (1,500 mg Oral Given 09/12/21 1944)  dexamethasone (DECADRON) injection 4 mg (4 mg Intravenous Given 09/12/21 1944)  gadobutrol (GADAVIST) 1 MMOL/ML injection 7.5 mL (7.5 mLs Intravenous Contrast Given 09/12/21 2047)    Mobility non-ambulatory Moderate fall risk   Focused Assessments Neuro Assessment Handoff:  Swallow screen pass?            Neuro Assessment: Within Defined Limits Neuro Checks:      Last Documented NIHSS Modified Score:   Has TPA been given? No If patient is a Neuro Trauma and patient is going to OR before floor call report to [facility] nurse: [phone] or [phone]   R Recommendations: See Admitting Provider Note  Report given to:   Additional Notes:

## 2021-09-13 NOTE — Consult Note (Addendum)
Consultation Note Date: 09/13/2021   Patient Name: Roger Solis  DOB: Jan 07, 1956  MRN: [id number]  Age / Sex: 66 y.o., male  PCP: [facility] Referring Physician: Charise Killian, MD  Reason for Consultation: Establishing goals of care  HPI/Patient Profile: 66 y.o. male  with past medical history of brain mets diagnosed 2022 s/p partial resection, chemo, immunotherapy and radiation complicated by CVA/post-radiation partial brain necrosis (confirmed by stereotactic brain biopsy of right frontal lobe 05/23/21) with residual left sided facial droop and left sided weakness, seizures, PAD s/p left AKA (08/06/21), HTN, HLD, and CAD  admitted on 09/12/2021 with 2 days of progressive decline and blurry vision.   Patient has worsening quality of life for the past month, 4 admissions in the past 6 months.  PMT has been consulted to assist with goals of care conversation.  Clinical Assessment and Goals of Care:  I have reviewed medical records including EPIC notes, labs and imaging, received report from RN, assessed the patient and then met at the bedside with patient's daughter Ishmael Holter and granddaughter Kerby Less and to discuss diagnosis prognosis, GOC, EOL wishes, disposition and options.  I introduced Palliative Medicine as specialized medical care for people living with serious illness. It focuses on providing relief from the symptoms and stress of a serious illness. The goal is to improve quality of life for both the patient and the family.  We discussed a brief life review of the patient and then focused on their current illness.  The natural disease trajectory and expectations at EOL were discussed.  I attempted to elicit values and goals of care important to the patient.    Medical History Review and Understanding:  Discussed patient's malignancy and prognosis, emphasizing that metastases to the brain typically has a  short prognosis.  Also discussed patient's several chronic comorbidities including postradiation brain necrosis, recent left AKA.  Social History: Patient lives at home with his daughter and granddaughter as his primary caregivers.  His son-in-law is also helpful.  He has another daughter Nira Conn who is supportive but does have to work.  Functional and Nutritional State: Patient is frustrated he has had months of decline with more rapid decline over the past few days.  He gets very nauseous after meals which affects his overall appetite and nutrition.  Palliative Symptoms: Pain, nausea, vomiting, weakness   Code Status: Concepts specific to code status, artifical feeding and hydration, and rehospitalization were considered and discussed.  Patient is tired of recurrent hospitalizations and does not want to return to the hospital again.  Discussion: During our conversation, patient is quick to share that he is tired of coming to the hospital so many times over the past few months.  He feels that if his time is running out, he would rather spend his days at home outside enjoying a smoke.  His quality of life has worsened drastically since AKA last month and he does not like constantly needing help to move around the home.  He is tearful and able to clearly express he understands hospice philosophy and he is at peace with transitioning to a focus on his comfort and dignity.  His family is supportive of this decision, noting that his other daughter will likely have a much harder time coming to terms with patient's decision.  He also has 2 sons.  Patient's granddaughter shares that he has seemed hopeless for some time now and is very difficult to see him like this.  Ishmael Holter is concerned that he is also  made comments about ending his life in the past and we discussed measures that can be in place to ensure safety with hospice medications.  [name] has a history of brain surgery and receives chemotherapy  herself, understanding what patient is going through and thinking logically about what is best for him at this time.  All are agreeable to transition to comfort care in the meantime while hospice at home is being coordinated.  Family is aware that hospice support is incremental and caregiver support is still largely provided by family.  They have gone through this before with [name]'s mother and grandmother.   The difference between aggressive medical intervention and comfort care was considered in light of the patient's goals of care. Hospice services outpatient were explained and offered.   Discussed the importance of continued conversation with family and the medical providers regarding overall plan of care and treatment options, ensuring decisions are within the context of the patient's values and GOCs.   Questions and concerns were addressed.  Hard Choices booklet left for review. The family was encouraged to call with questions or concerns.  PMT will continue to support holistically.  SUMMARY OF RECOMMENDATIONS   -DNR -Transition to comfort care today Morphine PRN for pain/air hunger/comfort Robinul PRN for excessive secretions Ativan PRN for agitation/anxiety Zofran PRN for nausea Liquifilm tears PRN for dry eyes May have comfort feeding Comfort cart for family Unrestricted visitations in the setting of EOL (per policy) Oxygen PRN 2L or less for comfort. No escalation.   -TOC consulted for referral to hospice at home -Patient's daughter prefers to have hospice arranged before discharge -Psychosocial emotional support provided -PMT will continue to follow  Prognosis:  < 6 months  Discharge Planning: Home with Hospice      Primary Diagnoses: Present on Admission:  Altered mental state    Physical Exam Vitals and nursing note reviewed.  Constitutional:      General: He is not in acute distress.    Appearance: He is ill-appearing.  Cardiovascular:     Rate and Rhythm:  Normal rate.  Pulmonary:     Effort: Pulmonary effort is normal.  Skin:    General: Skin is warm and dry.  Neurological:     Mental Status: He is alert and oriented to person, place, and time.  Psychiatric:        Behavior: Behavior normal.        Thought Content: Thought content normal.        Judgment: Judgment normal.     Vital Signs: BP (!) 159/73 (BP Location: Right Arm)   Pulse 80   Temp 98 F (36.7 C) (Oral)   Resp 18   Ht 6' 1" (1.854 m)   Wt 77.1 kg   SpO2 94%   BMI 22.43 kg/m  Pain Scale: 0-10   Pain Score: Asleep   SpO2: SpO2: 94 % O2 Device:SpO2: 94 % O2 Flow Rate: .    Palliative Assessment/Data:    MDM: High    Ademola Vert Johnnette Litter, PA-C  Palliative Medicine Team Team phone # [phone]  Thank you for allowing the Palliative Medicine Team to assist in the care of this patient. Please utilize secure chat with additional questions, if there is no response within 30 minutes please call the above phone number.  Palliative Medicine Team providers are available by phone from 7am to 7pm daily and can be reached through the team cell phone.  Should this patient require assistance outside of these hours, please  call the patient's attending physician.

## 2021-09-13 NOTE — ED Notes (Signed)
Breakfast order placed ?

## 2021-09-13 NOTE — Progress Notes (Signed)
Hospital day#1 Subjective:    Overnight Events: Patient started on increased dose of Decadron, Keppa, and Depakote.  Patient with Daughter, Ishmael Holter, and granddaughter at bedside.  Patient feels like the blurriness in his eye is better this morning. He was able to read the Exit sign and see people's faces with more ease. Floaters were still in his field of vision, "swimming fast", like a "dog". He continues to have increased phlegm production with about the same slurring he was experienced yesterday.  Of note, patient endorsed increased "phantom leg pain" for which he was getting gabapentin at home and PRN oxycodone, but mentioned that the pain has also gotten worse in the past couple of days. It also hurts when he touches his stump.  Patient denies fevers, chills, pain with urination, headache, lightheadedness.  [name] shared there has been an acute decline in the past three days. He continues to be more swollen, with more phlegm and coughing that scares her, she understand that patient needs to  be on steroid for the brain swelling and that this is halting the process for brain metastases. At this time, they are overwhelmed and expressed desire to talk with palliative care to explore comfort care.   Objective:  Vital signs in last 24 hours: Vitals:   09/12/21 2300 09/12/21 2315 09/13/21 0230 09/13/21 1003  BP: (!) 153/80 (!) 147/90 121/65 (!) 159/73  Pulse: 65 68 77 80  Resp:   18 18  Temp:      TempSrc:      SpO2: 96% 93% 94% 94%  Weight:      Height:       Supplemental O2: Room Air SpO2: 94 % Filed Weights   09/12/21 1343  Weight: 77.1 kg    Physical Exam:  Constitutional:Ill-appearing man laying in stretcher in the ED, with increased phlegm in his mouth HENT:moist mucous membranes. Cardiovascular: regular rate and rhythm, no m/r/g. Decreased pulses in the R DP, but warm. Pulmonary/Chest: normal work of breathing on room air, lungs clear to auscultation, with mild  crackles on L lung bases. No wheezing Abdominal: present BS, soft, non-tender, non-distended.  MSK: L AKA, with tenderness to palpation over erythematous, non purulent, and swollen area over incision site. No Pitting edema. Warm to palpation bilaterally Neurological: alert & oriented to self and place, normal EOM, L sided facial droop, mild dysarthria, 5/5 strength on the R, 4/5 strength in the LUE. No change in sensation bilaterally Skin: warm and dry Psych: depressed mood and affect   No intake or output data in the 24 hours ending 09/13/21 1045 Net IO Since Admission: No IO data has been entered for this period [09/13/21 1045]  Pertinent Labs:    Latest Ref Rng & Units 09/13/2021    5:37 AM 09/12/2021    2:11 PM 09/01/2021    3:24 AM  CBC  WBC 4.0 - 10.5 K/uL 12.9  16.1  7.7   Hemoglobin 13.0 - 17.0 g/dL 12.0  12.2  11.7   Hematocrit 39.0 - 52.0 % 37.7  38.6  34.9   Platelets 150 - 400 K/uL 143  154  215        Latest Ref Rng & Units 09/13/2021    5:37 AM 09/12/2021    2:11 PM 09/01/2021    3:24 AM  CMP  Glucose 70 - 99 mg/dL 77  148  116   BUN 8 - 23 mg/dL 23  23  18    Creatinine 0.61 - 1.24 mg/dL  0.77  0.94  0.68   Sodium 135 - 145 mmol/L 141  143  140   Potassium 3.5 - 5.1 mmol/L 4.3  4.0  4.1   Chloride 98 - 111 mmol/L 106  102  103   CO2 22 - 32 mmol/L 26  24  23    Calcium 8.9 - 10.3 mg/dL 8.3  8.9  9.5   Total Protein 6.5 - 8.1 g/dL  5.8    Total Bilirubin 0.3 - 1.2 mg/dL  0.4    Alkaline Phos 38 - 126 U/L  45    AST 15 - 41 U/L  20    ALT 0 - 44 U/L  15      Imaging: VAS [place] ABI WITH/WO TBI  Result Date: 09/13/2021  LOWER EXTREMITY DOPPLER STUDY Patient Name:  ABAS LEICHT  Date of Exam:   09/13/2021 Medical Rec #: [id number]        Accession #:    [id number] Date of Birth: 01-Jul-1955         Patient Gender: M Patient Age:   66 years Exam Location:  [facility] Procedure:      VAS [place] ABI WITH/WO TBI Referring Phys: GRACE LAU  --------------------------------------------------------------------------------  Indications: Peripheral artery disease.  Comparison       No recent previous exam found. Last noted is 2015 with Rt ABI Study:           0.8. Performing Technologist: Bobetta Lime BS RVT  Examination Guidelines: A complete evaluation includes at minimum, Doppler waveform signals and systolic blood pressure reading at the level of bilateral brachial, anterior tibial, and posterior tibial arteries, when vessel segments are accessible. Bilateral testing is considered an integral part of a complete examination. Photoelectric Plethysmograph (PPG) waveforms and toe systolic pressure readings are included as required and additional duplex testing as needed. Limited examinations for reoccurring indications may be performed as noted.  ABI Findings: +---------+------------------+-----+----------+--------+ Right    Rt Pressure (mmHg)IndexWaveform  Comment  +---------+------------------+-----+----------+--------+ Brachial 137                    triphasic          +---------+------------------+-----+----------+--------+ PTA      83                0.58 monophasic         +---------+------------------+-----+----------+--------+ PERO     92                0.64 monophasic         +---------+------------------+-----+----------+--------+ Great Toe60                0.42                    +---------+------------------+-----+----------+--------+ +--------+------------------+-----+---------+-------+ Left    Lt Pressure (mmHg)IndexWaveform Comment +--------+------------------+-----+---------+-------+ [id number]                    triphasic        +--------+------------------+-----+---------+-------+ +-------+-----------+-----------+------------+------------+ ABI/TBIToday's ABIToday's TBIPrevious ABIPrevious TBI +-------+-----------+-----------+------------+------------+ Right  .64        0.42                                 +-------+-----------+-----------+------------+------------+ Left AKA.  Summary: Right: Resting right ankle-brachial index indicates moderate right lower extremity arterial disease. The right toe-brachial index is abnormal. Left: Left AKA. *See table(s) above for measurements and observations.  Preliminary    MR BRAIN W WO CONTRAST  Result Date: 09/12/2021 CLINICAL DATA:  Initial evaluation for metastatic lung cancer, blurry vision. EXAM: MRI HEAD WITHOUT AND WITH CONTRAST TECHNIQUE: Multiplanar, multiecho pulse sequences of the brain and surrounding structures were obtained without and with intravenous contrast. CONTRAST:  7.55mL GADAVIST GADOBUTROL 1 MMOL/ML IV SOLN COMPARISON:  MRI from 08/31/2021. FINDINGS: Brain: Postoperative changes from prior right-sided craniotomy. Underlying irregular enhancing area at the level of the right frontal operculum measures 3.5 x 2.7 x 2.5 cm. Surrounding vasogenic edema throughout the adjacent right cerebral hemisphere. Mild mass effect on the right lateral ventricle without significant midline shift. Additional enhancing lesion at the contralateral left frontal lobe measures 1.3 x 0.9 x 1.2 cm. More mild localized vasogenic edema about this lesion. Overall, these findings are not significantly changed or progressed from prior. No new lesions identified. Underlying atrophy with chronic microvascular ischemic disease noted. No evidence for acute or subacute infarct. No other mass lesion. No hydrocephalus. No extra-axial fluid collection. Pituitary gland within normal limits. Vascular: Major intracranial vascular flow voids are maintained. Skull and upper cervical spine: Craniocervical junction within normal limits. Bone marrow signal intensity normal. No focal marrow replacing lesion. Prior right craniotomy. Sinuses/Orbits: Globes and orbital soft tissues demonstrate no acute finding. Scattered mucosal thickening noted about the ethmoidal air cells and  maxillary sinuses. Trace left mastoid effusion, of doubtful significance. Other: None. IMPRESSION: 1. No significant interval change in size and appearance of enhancing areas/lesions involving the right frontal operculum and contralateral left frontal lobe since 08/31/2021. Surrounding vasogenic edema with mild mass effect without significant midline shift. No new lesions. 2. No other acute intracranial abnormality. Electronically Signed   By: Jeannine Boga M.D.   On: 09/12/2021 22:22   DG Chest Port 1 View  Result Date: 09/12/2021 CLINICAL DATA:  A 66 year old male presents for evaluation due to cough. Reported history of lung and colon cancer. EXAM: PORTABLE CHEST 1 VIEW COMPARISON:  August 30, 2021. FINDINGS: EKG leads project over the chest. Post median sternotomy. Cardiomediastinal contours and hilar structures are stable. No lobar consolidation. No sign of pleural effusion. No pneumothorax. Interstitial prominence seen on the previous study is diminished. On limited assessment no acute skeletal process. IMPRESSION: 1. No acute cardiopulmonary disease. 2. Interstitial prominence seen on the previous study is diminished. Electronically Signed   By: Zetta Bills M.D.   On: 09/12/2021 19:11   CT Head Wo Contrast  Result Date: 09/12/2021 CLINICAL DATA:  Mental status changes of unknown cause. History of CNS cancer, prior surgery and radiation therapy EXAM: CT HEAD WITHOUT CONTRAST TECHNIQUE: Contiguous axial images were obtained from the base of the skull through the vertex without intravenous contrast. RADIATION DOSE REDUCTION: This exam was performed according to the departmental dose-optimization program which includes automated exposure control, adjustment of the mA and/or kV according to patient size and/or use of iterative reconstruction technique. COMPARISON:  08/30/2021 FINDINGS: Brain: Prior RIGHT temporoparietal craniotomy. Generalized atrophy. Large area of vasogenic edema again identified  within the RIGHT frontal lobe. Small hyperdense focus RIGHT frontal subcortical white matter 7 mm diameter, unchanged, likely reflects known tumor. Overlying dural thickening at the craniotomy site. Small vessel chronic ischemic changes of deep cerebral white matter. No new intracranial hemorrhage, mass, or infarct. No new extra-axial collections. Vascular: No hyperdense vessels. Extensive atherosclerotic calcification of internal carotid arteries and RIGHT vertebral artery at skull base. Skull: Post craniotomy changes RIGHT temporoparietal. No acute osseous findings. Sinuses/Orbits: Clear Other: N/A  IMPRESSION: Again identified large area of white matter hypoattenuation consistent with vasogenic edema in RIGHT frontal lobe. 7 mm hyperdense focus RIGHT frontal lobe little changed from previous exam, likely known tumor. Postsurgical changes RIGHT craniotomy. No new intracranial abnormalities. Electronically Signed   By: Lavonia Dana M.D.   On: 09/12/2021 14:51    Assessment/Plan:   Principal Problem:   Altered mental state   Patient Summary: Lamount Bankson is a 66 y.o. with a pertinent PMH of squamous cell carcinoma of the lung, diagnosed in 2020, with brain metastases in 2022, s/p R temporoparietal resection, chemotherapy, radiation complicated by CVA and post-radiation brain necrosis confirmed with brain biopsy in 05/23/21), incomplete immunotherapy course 2/2 continuous need for immunosuppression with Decadron, CAD, PADs/p recent left AKA (08/06/2021), HTN, HLD presenting with visual disturbances and acute weakness, evaluated for new brain metastases or brain swelling, ruled out by CT and MRI, now being transitioned to comfort care  Metastatic brain disease Squamous cell carcinoma of the lung Seizures New visual disturbance, now improving MRI brain with no interval change in size or appearance of enhancing areas/lesions on the R and L frontal lobes since 08/31/21. On admission, patient's case was  discussed with Dr. Mickeal Skinner, neuro-oncologist, increased Decadron dose (4 mg QID) and restarting antiseizure medications. Per MRI, there are some areas of vasogenic edema wild mild midline shift, deemed insignificant. Patient has been evaluated by [facility] ophthalmologist with dilated exam, and found to be 20/80 OU, with bilateral IOP os 20,21, and per daughter, no mention of inflammation on the optic disc. Though mildly unchanged, suspect that edema and  mass changes are contributing to the blurred vision patient is describing. Patient endorses improvement in blurriness this am after increase in Decadron. Since, patient has been evaluated by palliative care and will be moved to comfort care.  - Continue Keppra and Depakote for seizure prevention - Continue PRN ativan for seizure breakthrough - Continue Decadron - Will talk with Patient's neuro-oncologist, Dr. Mickeal Skinner, for update  Comfort care measures Palliative care consulted, appreciate recommendations - Patient on Gabapentin 600 TID - Glycopyrrolate SubQ q4HR PRN -Norco/Vicodin 5-325 mg q4HR for moderate pain - Morphine 5 mg oral solution q2hr PRN for moderate pain or dyspnea -Zofra 4 mg q6hr PRN for nausea -Pantoprazole 40 mg daily - Miralax and Senna S for constipation  PAD s/p AKA 08/06/2021 CAD, CABG  L AKA, with tenderness to palpation over erythematous, non purulent, and swollen area over incision site. Concerned with possible infection at incision site. Patient repeat ABI was abnormal; R leg warm to touch and no pain per patient. Patient is now comfort care; will continue to monitor at this time and manage pain as above.  COPD No evidence of exacerbation at this time. Will continue to monitor respiratory status as manage as above.   Diet: Heart Healthy VTE:  None  - patient is comfort care Code: DNR Family Update: Family UTD at bedside this am.  Dispo: Anticipated discharge to Home in 1 days pending with home hospice.   Romana Juniper, MD Internal Medicine Resident PGY-1 Please contact the on call pager after 5 pm and on weekends at [phone].

## 2021-09-13 NOTE — ED Notes (Signed)
Pt wet of urine from leakage after using urinal. This tech and help from another tech, cleaned pt, provided clean linen, grown, and draw sheet. Placed pt on condom cathter. Provided pt with warm blankets. Nurse notified.

## 2021-09-13 NOTE — Progress Notes (Signed)
SLP Cancellation Note  Patient Details Name: Roger Solis MRN: [id number] DOB: 07-29-1955   Cancelled treatment:       Reason Eval/Treat Not Completed: Other (comment). Pt transitioning to comfort measures. Will d/c SLP orders at this time.    Sahib Pella, Katherene Ponto 09/13/2021, 2:58 PM

## 2021-09-13 NOTE — Progress Notes (Signed)
ABI/TBI study completed. Please see CV Proc for preliminary results.  Leshonda Galambos BS, RVT 09/13/2021 9:24 AM

## 2021-09-14 DIAGNOSIS — H538 Other visual disturbances: Secondary | ICD-10-CM | POA: Diagnosis not present

## 2021-09-14 DIAGNOSIS — C3490 Malignant neoplasm of unspecified part of unspecified bronchus or lung: Secondary | ICD-10-CM | POA: Diagnosis not present

## 2021-09-14 DIAGNOSIS — R4182 Altered mental status, unspecified: Secondary | ICD-10-CM | POA: Diagnosis not present

## 2021-09-14 LAB — URINE CULTURE: Culture: 60000 — AB

## 2021-09-14 NOTE — Progress Notes (Signed)
Manufacturing engineer [facility]) Hospital Liaison Note  Referral received for patient/family interest in home with hospice. ACC spoke with patient's daughter Ishmael Holter to confirm interest. Interest confirmed.   Hospice eligibility confirmed.   Plan is to discharge home tomorrow via private vehicle.   DME in the home: wheelchair, geriatric [facility], transport chair, and walker.   DME needs to be assessed on admission. [name] has requested a medication lock box. [facility] specialists informed.   Please send patient home with comfort medications/prescriptions at discharge.   Please call with any questions or concerns. Thank you  Roselee Nova, [facility] Liaison [phone]

## 2021-09-14 NOTE — TOC Initial Note (Signed)
Transition of Care [facility]) - Initial/Assessment Note    Patient Details  Name: Roger Solis MRN: [id number] Date of Birth: 02-Jun-1955  Transition of Care [facility]) CM/SW Contact:    Ninfa Meeker, RN Phone Number: 09/14/2021, 12:16 PM  Clinical Narrative:   Case Manager spoke with patient's daughter, Nichola Sizer [phone], to discuss discharge plan and the request for Home Hospice services. [name] states that this is her dad's wish. Discussed Hospice agencies. She has no preference, gives CM permission to arrange with [facility]. Referral called to Roselee Nova, LCSW Liaison with [facility]. [name] states that patient lives with her and family and they provide his care. TOC Team to continue to follow.   Expected Discharge Plan: [place]     Patient Goals and CMS Choice     Choice offered to / list presented to : Adult Children  Expected Discharge Plan and Services Expected Discharge Plan: Home w Hospice Care In-house Referral: Hospice / Palliative Care Discharge Planning Services: CM Consult Post Acute Care Choice: Hospice Living arrangements for the past 2 months: Single Family Home Expected Discharge Date: 09/15/21               DME Arranged: N/A           HH Agency: [facility] [place] Date HH Agency Contacted: 09/14/21 Time HH Agency Contacted: 1215 Representative spoke with at [place]: Roselee Nova  Prior Living Arrangements/Services Living arrangements for the past 2 months: [place] Lives with:: Adult Children                   Activities of Daily Living Home Assistive Devices/Equipment: Wheelchair ADL Screening (condition at time of admission) Is the patient deaf or have difficulty hearing?: No Does the patient have difficulty seeing, even when wearing glasses/contacts?: No Does the patient have difficulty concentrating, remembering, or making decisions?: No Patient able to express need for assistance  with ADLs?: No Does the patient have difficulty dressing or bathing?: Yes Independently performs ADLs?: No Communication: Needs assistance Dressing (OT): Needs assistance Grooming: Needs assistance Feeding: Needs assistance Bathing: Needs assistance Toileting: Needs assistance In/Out Bed: Needs assistance Walks in Home: Needs assistance Does the patient have difficulty walking or climbing stairs?: Yes Weakness of Legs: Left Weakness of Arms/Hands: Left  Permission Sought/Granted                  Emotional Assessment         Alcohol / Substance Use: Not Applicable Psych Involvement: No (comment)  Admission diagnosis:  Altered mental state [R41.82] Blurry vision [H53.8] Acute cystitis with hematuria [N30.01] Primary malignant neoplasm of lung metastatic to other site, unspecified laterality [facility]) [C34.90] Patient Active Problem List   Diagnosis Date Noted   Altered mental state 09/12/2021   Chronic obstructive pulmonary disease ([place]) 09/06/2021   Encounter for fitting and adjustment of hearing aid 09/06/2021   Hyperlipidemia 09/06/2021   Impacted cerumen, bilateral 09/06/2021   Intermittent claudication ([place]) 09/06/2021   MI (myocardial infarction) ([place]) 09/06/2021   Edema, unspecified 09/06/2021   Other long term (current) drug therapy 09/06/2021   Encounter for antineoplastic immunotherapy 09/06/2021   History of heart artery stent 09/06/2021   Malignant neoplasm metastatic to brain ([place]) 09/06/2021   Cataract 09/06/2021   Status epilepticus ([place]) 08/30/2021   Acute encephalopathy    PAD (peripheral artery disease) ([place])    S/P BKA (below knee amputation) unilateral, left (HCC)    Pressure injury of  skin 08/16/2021   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[place]) 08/09/2021   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 ([place]) 08/05/2021   History of seizure 08/05/2021   CAD (coronary artery disease) 08/05/2021   HTN (hypertension) 08/05/2021   History  of CVA (cerebrovascular accident) 08/05/2021   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 [place] ([place]) [place] [address] Phone: [phone] Fax: [place], [place] - [place] [address] [place] [address] Phone: [phone] Fax: [phone]     Social Determinants of Health (SDOH) Interventions    Readmission Risk Interventions     No data to display

## 2021-09-14 NOTE — Progress Notes (Signed)
Daily Progress Note   Patient Name: Roger Solis       Date: 09/14/2021 DOB: 1955/12/07  Age: 66 y.o. MRN#: [id number] Attending Physician: Charise Killian, MD Primary Care Physician: [facility] Admit Date: 09/12/2021  Reason for Consultation/Follow-up: Establishing goals of care  Subjective: Medical records reviewed. Patient assessed at the bedside.  He denies pain or distress.  No family present during my visit.  Created space and opportunity for patient's thoughts and feelings on his current illness.  He tells me he looks forward to going home, confirms the plan is to return with hospice tomorrow around 2 PM.  He is appreciative of comfort medicines and states currently as needed basis for Ativan and morphine is effective.  Questions and concerns addressed. PMT will continue to support holistically.   Length of Stay: 2   Physical Exam Vitals and nursing note reviewed.  Constitutional:      General: He is not in acute distress.    Appearance: He is ill-appearing.  Cardiovascular:     Rate and Rhythm: Normal rate.  Pulmonary:     Effort: Pulmonary effort is normal.  Skin:    General: Skin is warm and dry.  Neurological:     Mental Status: He is alert and oriented to person, place, and time.  Psychiatric:        Mood and Affect: Mood normal.             Vital Signs: BP (!) 108/54 (BP Location: Left Arm)   Pulse 81   Temp 98.7 F (37.1 C) (Oral)   Resp 17   Ht 6\' 1"  (1.854 m)   Wt 77.1 kg   SpO2 94%   BMI 22.43 kg/m  SpO2: SpO2: 94 % O2 Device: O2 Device: Room Air O2 Flow Rate:         Palliative Care Assessment & Plan   Patient Profile: 66 y.o. male  with past medical history of brain mets diagnosed 2022 s/p partial resection, chemo, immunotherapy and  radiation complicated by CVA/post-radiation partial brain necrosis (confirmed by stereotactic brain biopsy of right frontal lobe 05/23/21) with residual left sided facial droop and left sided weakness, seizures, PAD s/p left AKA (08/06/21), HTN, HLD, and CAD  admitted on 09/12/2021 with 2 days of progressive decline and blurry vision.  Patient has worsening quality of life for the past month, 4 admissions in the past 6 months.  PMT has been consulted to assist with goals of care conversation.  Assessment: End of life care    Recommendations/Plan: Continue comfort care medications per MAR, no adjustments required today Patient will discharge home with hospice tomorrow PMT will continue to follow and support   Prognosis:  < 6 months  Discharge Planning: Home with Hospice  Care plan was discussed with patient  MDM high         Kennidee Heyne Johnnette Litter, PA-C  Palliative Medicine Team Team phone # [phone]  Thank you for allowing the Palliative Medicine Team to assist in the care of this patient. Please utilize secure chat with additional questions, if there is no response within 30 minutes please call the above phone number.  Palliative Medicine Team providers are available by phone from 7am to 7pm daily and can be reached through the team cell phone.  Should this patient require assistance outside of these hours, please call the patient's attending physician.

## 2021-09-14 NOTE — Progress Notes (Addendum)
Hospital day#2 Subjective:    Overnight Events: No overnight events  Patient laying in bed comfortably.  Endorses improvement from yesterday, no signs or symptoms of worsening breathing.  He is tolerating p.o. intake, denies pain.  Still endorses increased secretions in his mouth, but has been suctioning at and feels better.  Feels okay with the plan about being comfortable at home instead of being at the hospital receiving treatment.  Daughter visited this morning.   Objective:  Vital signs in last 24 hours: Vitals:   09/13/21 2120 09/13/21 2347 09/14/21 0334 09/14/21 0902  BP: (!) 97/58 121/61 120/63 (!) 108/54  Pulse: 85 72 80 81  Resp:  20 20 17   Temp: 98.7 F (37.1 C) 98.7 F (37.1 C)    TempSrc: Oral Oral    SpO2:      Weight:      Height:       Supplemental O2: Room Air SpO2: 94 % Filed Weights   09/12/21 1343  Weight: 77.1 kg    Physical Exam:  Constitutional: Ill-appearing man laying in bed, in not acute distress.  Rest of exam deferred as he is in comfort measures   Intake/Output Summary (Last 24 hours) at 09/14/2021 1459 Last data filed at 09/14/2021 0300 Gross per 24 hour  Intake --  Output 1000 ml  Net -1000 ml   Net IO Since Admission: -1,000 mL [09/14/21 1459]  Pertinent Labs:    Latest Ref Rng & Units 09/13/2021    5:37 AM 09/12/2021    2:11 PM 09/01/2021    3:24 AM  CBC  WBC 4.0 - 10.5 K/uL 12.9  16.1  7.7   Hemoglobin 13.0 - 17.0 g/dL 12.0  12.2  11.7   Hematocrit 39.0 - 52.0 % 37.7  38.6  34.9   Platelets 150 - 400 K/uL 143  154  215        Latest Ref Rng & Units 09/13/2021    5:37 AM 09/12/2021    2:11 PM 09/01/2021    3:24 AM  CMP  Glucose 70 - 99 mg/dL 77  148  116   BUN 8 - 23 mg/dL 23  23  18    Creatinine 0.61 - 1.24 mg/dL 0.77  0.94  0.68   Sodium 135 - 145 mmol/L 141  143  140   Potassium 3.5 - 5.1 mmol/L 4.3  4.0  4.1   Chloride 98 - 111 mmol/L 106  102  103   CO2 22 - 32 mmol/L 26  24  23    Calcium 8.9 - 10.3 mg/dL 8.3   8.9  9.5   Total Protein 6.5 - 8.1 g/dL  5.8    Total Bilirubin 0.3 - 1.2 mg/dL  0.4    Alkaline Phos 38 - 126 U/L  45    AST 15 - 41 U/L  20    ALT 0 - 44 U/L  15      Imaging: No results found.  Assessment/Plan:   Principal Problem:   Altered mental state   Patient Summary: Vedh Ptacek is a 66 y.o. with a pertinent PMH of squamous cell carcinoma of the lung, diagnosed in 2020, with brain metastases in 2022, s/p R temporoparietal resection, chemotherapy, radiation complicated by CVA and post-radiation brain necrosis confirmed with brain biopsy in 05/23/21), incomplete immunotherapy course 2/2 continuous need for immunosuppression with Decadron, CAD, PADs/p recent left AKA (08/06/2021), HTN, HLD presenting with visual disturbances and acute weakness, evaluated for new brain metastases or  brain swelling, ruled out by CT and MRI, now comfort care and will be discharged tomorrow on home hospice  metastatic brain disease Squamous cell carcinoma of the lung Seizures New visual disturbance, now improving MRI brain with no interval change in size or appearance of enhancing areas/lesions on the R and L frontal lobes since 08/31/21. On admission, patient's case was discussed with Dr. Mickeal Skinner, neuro-oncologist, increased Decadron dose (4 mg QID) and restarting antiseizure medications. Per MRI, there are some areas of vasogenic edema wild mild midline shift, deemed insignificant. Patient has been evaluated by [facility] ophthalmologist with dilated exam, and found to be 20/80 OU, with bilateral IOP os 20,21, and per daughter, no mention of inflammation on the optic disc. Though mildly unchanged, suspect that edema and  mass changes are contributing to the blurred vision patient is describing.  Palliative care has been consulted, and is following this case.  Patient now transitioned to comfort care. Dr. Mickeal Skinner has been informed.  - Continue Keppra and Depakote for seizure prevention - Continue PRN ativan for  seizure breakthrough - Continue Decadron - Will talk with Patient's neuro-oncologist, Dr. Mickeal Skinner, for update  Comfort care measures Palliative care consulted, appreciate recommendations.  Patient remains comfortable and has not been accepted to home hospice.  Pending discharge tomorrow 7/14. - Patient on Gabapentin 600 TID - Glycopyrrolate SubQ q4HR PRN -Norco/Vicodin 5-325 mg q4HR for moderate pain - Morphine 5 mg oral solution q2hr PRN for moderate pain or dyspnea -Zofra 4 mg q6hr PRN for nausea -Pantoprazole 40 mg daily - Miralax and Senna S for constipation  PAD s/p AKA 08/06/2021 CAD, CABG  L AKA, with tenderness to palpation over erythematous, non purulent, and swollen area over incision site. Concerned with possible infection at incision site. Patient repeat ABI was abnormal; R leg warm to touch and no pain per patient. Patient is now comfort care; will continue to monitor at this time and manage pain as above.  COPD No evidence of exacerbation at this time. Will continue to monitor respiratory status as manage as above.   Diet: Heart Healthy VTE:  None  - patient is comfort care Code: DNR Family Update: Spoke with daughter this morning, and she now knows that patient will be discharged home tomorrow at 2 PM.  She mentioned she has everything at home so that patient can be comfortable and has been in touch with home hospice agency.  Dispo: Anticipated discharge to Home in 1 days pending with home hospice.   Romana Juniper, MD Internal Medicine Resident PGY-1 Please contact the on call pager after 5 pm and on weekends at [phone].

## 2021-09-15 ENCOUNTER — Other Ambulatory Visit (HOSPITAL_COMMUNITY): Payer: Self-pay

## 2021-09-15 DIAGNOSIS — R4182 Altered mental status, unspecified: Secondary | ICD-10-CM | POA: Diagnosis not present

## 2021-09-15 MED ORDER — DEXAMETHASONE 4 MG PO TABS
4.0000 mg | ORAL_TABLET | Freq: Four times a day (QID) | ORAL | 0 refills | Status: DC
  Filled 2021-09-15: qty 30, 8d supply, fill #0

## 2021-09-15 MED ORDER — PANTOPRAZOLE SODIUM 40 MG PO TBEC: 40.0000 mg | DELAYED_RELEASE_TABLET | Freq: Every day | ORAL | 0 refills | Status: DC

## 2021-09-15 MED ORDER — NICOTINE 21 MG/24HR TD PT24
MEDICATED_PATCH | TRANSDERMAL | 0 refills | Status: DC
Start: 2021-09-15 — End: 2023-04-26

## 2021-09-15 MED ORDER — DIVALPROEX SODIUM 250 MG PO DR TAB: 750.0000 mg | DELAYED_RELEASE_TABLET | Freq: Two times a day (BID) | ORAL | 0 refills | Status: DC

## 2021-09-15 MED ORDER — LEVETIRACETAM 750 MG PO TABS: 1500.0000 mg | ORAL_TABLET | Freq: Two times a day (BID) | ORAL | 0 refills | Status: DC

## 2021-09-15 MED ORDER — SENNA 8.6 MG PO TABS
1.0000 | ORAL_TABLET | Freq: Every evening | ORAL | 0 refills | Status: DC | PRN
  Filled 2021-09-15: qty 120, 120d supply, fill #0

## 2021-09-15 MED ORDER — HYDROCODONE-ACETAMINOPHEN 5-325 MG PO TABS: 1.0000 | ORAL_TABLET | ORAL | 0 refills | Status: DC | PRN

## 2021-09-15 MED ORDER — LORAZEPAM 1 MG PO TABS: 1.0000 mg | ORAL_TABLET | ORAL | 0 refills | Status: DC | PRN

## 2021-09-15 MED ORDER — SENNA 8.6 MG PO TABS: 1.0000 | ORAL_TABLET | Freq: Every evening | ORAL | 0 refills | Status: DC | PRN

## 2021-09-15 MED ORDER — MORPHINE SULFATE (CONCENTRATE) 10 MG/0.5ML PO SOLN
5.0000 mg | ORAL | 0 refills | Status: DC | PRN
Start: 2021-09-15 — End: 2021-09-15
  Filled 2021-09-15: qty 180, 60d supply, fill #0

## 2021-09-15 MED ORDER — ONDANSETRON 4 MG PO TBDP: 4.0000 mg | ORAL_TABLET | Freq: Four times a day (QID) | ORAL | 0 refills | Status: DC | PRN

## 2021-09-15 MED ORDER — MORPHINE SULFATE (CONCENTRATE) 10 MG/0.5ML PO SOLN: 5.0000 mg | ORAL | 0 refills | Status: AC | PRN

## 2021-09-15 MED ORDER — DEXAMETHASONE 4 MG PO TABS: 4.0000 mg | ORAL_TABLET | Freq: Four times a day (QID) | ORAL | 0 refills | Status: DC

## 2021-09-15 MED ORDER — GLYCOPYRROLATE 1 MG PO TABS
1.0000 mg | ORAL_TABLET | ORAL | 0 refills | Status: DC | PRN
  Filled 2021-09-15: qty 30, 5d supply, fill #0

## 2021-09-15 MED ORDER — POLYVINYL ALCOHOL 1.4 % OP SOLN
1.0000 [drp] | Freq: Four times a day (QID) | OPHTHALMIC | 0 refills | Status: DC | PRN
  Filled 2021-09-15: qty 15, 75d supply, fill #0

## 2021-09-15 MED ORDER — POLYETHYLENE GLYCOL 3350 17 G PO PACK: 17.0000 g | PACK | Freq: Every day | ORAL | 0 refills | Status: DC

## 2021-09-15 MED ORDER — GLYCOPYRROLATE 1 MG PO TABS: 1.0000 mg | ORAL_TABLET | ORAL | 0 refills | Status: DC | PRN

## 2021-09-15 MED ORDER — LORAZEPAM 1 MG PO TABS
1.0000 mg | ORAL_TABLET | ORAL | 0 refills | Status: DC | PRN
  Filled 2021-09-15: qty 30, 5d supply, fill #0

## 2021-09-15 MED ORDER — ONDANSETRON 4 MG PO TBDP
4.0000 mg | ORAL_TABLET | Freq: Four times a day (QID) | ORAL | 0 refills | Status: DC | PRN
  Filled 2021-09-15: qty 20, 5d supply, fill #0

## 2021-09-15 MED ORDER — ALBUTEROL SULFATE HFA 108 (90 BASE) MCG/ACT IN AERS: 1.0000 | INHALATION_SPRAY | Freq: Four times a day (QID) | RESPIRATORY_TRACT | 0 refills | Status: DC | PRN

## 2021-09-15 MED ORDER — POLYVINYL ALCOHOL 1.4 % OP SOLN: 1.0000 [drp] | Freq: Four times a day (QID) | OPHTHALMIC | 0 refills | Status: DC | PRN

## 2021-09-15 MED ORDER — HYDROCODONE-ACETAMINOPHEN 5-325 MG PO TABS
1.0000 | ORAL_TABLET | ORAL | 0 refills | Status: DC | PRN
  Filled 2021-09-15: qty 30, 3d supply, fill #0

## 2021-09-15 NOTE — Discharge Instructions (Signed)
Dear. Mr. Gladd,  It has been our pleasure taking care of you.   Your Roger Solis Internal medicine team

## 2021-09-15 NOTE — Discharge Summary (Signed)
Name: Roger Solis MRN: [id number] DOB: 10-Nov-1955 66 y.o. PCP: [facility], [facility]  Date of Admission: 09/12/2021  1:22 PM Date of Discharge: 09/15/2021 11:22 AM Attending Physician: Dr. Saverio Danker  Discharge Diagnosis: Principal Problem:   Altered mental state    Discharge Medications: Allergies as of 09/15/2021   No Known Allergies      Medication List     STOP taking these medications    amLODipine 2.5 MG tablet Commonly known as: NORVASC   atorvastatin 80 MG tablet Commonly known as: LIPITOR   gabapentin 600 MG tablet Commonly known as: Neurontin   magnesium gluconate 500 MG tablet Commonly known as: MAGONATE   Vitamin D (Ergocalciferol) 1.25 MG (50000 UNIT) Caps capsule Commonly known as: DRISDOL       TAKE these medications    acetaminophen 325 MG tablet Commonly known as: TYLENOL Take 1-2 tablets (325-650 mg total) by mouth every 6 (six) hours as needed for mild pain (pain score 1-3 or temp > 100.5).   albuterol 108 (90 Base) MCG/ACT inhaler Commonly known as: VENTOLIN HFA Inhale 1 puff into the lungs every 6 (six) hours as needed for shortness of breath.   dexamethasone 4 MG tablet Commonly known as: DECADRON Take 1 tablet (4 mg total) by mouth 4 (four) times daily. What changed:  medication strength when to take this   divalproex 250 MG DR tablet Commonly known as: DEPAKOTE Take 3 tablets (750 mg total) by mouth 2 (two) times daily.   docusate sodium 100 MG capsule Commonly known as: COLACE Take 1 capsule (100 mg total) by mouth daily.   glycopyrrolate 1 MG tablet Commonly known as: ROBINUL Take 1 tablet (1 mg total) by mouth every 4 (four) hours as needed (excessive secretions).   HYDROcodone-acetaminophen 5-325 MG tablet Commonly known as: NORCO/VICODIN Take 1-2 tablets by mouth every 4 (four) hours as needed for moderate pain (pain score 4-6).   levETIRAcetam 750 MG tablet Commonly known as: KEPPRA Take 2 tablets (1,500 mg  total) by mouth 2 (two) times daily.   LORazepam 1 MG tablet Commonly known as: ATIVAN Take 1 tablet (1 mg total) by mouth every 4 (four) hours as needed for anxiety.   morphine CONCENTRATE 10 MG/0.5ML Soln concentrated solution Take 0.25 mLs (5 mg total) by mouth every 2 (two) hours as needed for up to 7 days for moderate pain (or dyspnea).   nicotine 21 mg/24hr patch Commonly known as: NICODERM CQ - dosed in mg/24 hours 21 mg patch daily x2 weeks then 14 mg patch daily x3 weeks then 7 mg patch daily x3 weeks and stop   ondansetron 4 MG disintegrating tablet Commonly known as: ZOFRAN-ODT Take 1 tablet (4 mg total) by mouth every 6 (six) hours as needed for nausea.   pantoprazole 40 MG tablet Commonly known as: PROTONIX Take 1 tablet (40 mg total) by mouth daily.   polyethylene glycol 17 g packet Commonly known as: MIRALAX / GLYCOLAX Take 17 g by mouth daily.   polyvinyl alcohol 1.4 % ophthalmic solution Commonly known as: LIQUIFILM TEARS Place 1 drop into both eyes 4 (four) times daily as needed for dry eyes.   senna 8.6 MG Tabs tablet Commonly known as: SENOKOT Take 1 tablet (8.6 mg total) by mouth at bedtime as needed for mild constipation.               Durable Medical Equipment  (From admission, onward)           Start  Ordered   09/15/21 1154  DME 3-in-1  Once        09/15/21 1201   09/15/21 0000  For home use only DME 4 wheeled rolling walker with seat       Question:  Patient needs a walker to treat with the following condition  Answer:  Physical deconditioning   09/15/21 1201   09/15/21 0000  DME Bedside commode       Question:  Patient needs a bedside commode to treat with the following condition  Answer:  Physical deconditioning   09/15/21 1201   09/15/21 0000  For home use only DME standard manual wheelchair with seat cushion       Comments: Patient suffers from physical deconditioning 2/2 metastatic lung cancer which impairs their ability to  perform daily activities like bathing, dressing, feeding, grooming, and toileting in the home.  A cane or crutch will not resolve issue with performing activities of daily living. A wheelchair will allow patient to safely perform daily activities. Patient can safely propel the wheelchair in the home or has a caregiver who can provide assistance. Length of need Lifetime. Accessories: elevating leg rests (ELRs), wheel locks, extensions and anti-tippers.   09/15/21 1201            Disposition and follow-up:   Roger Solis was discharged from [facility] in [place] condition.  At the hospital follow up visit please address:  1.  Follow-up:  Comfort Care -monitor for signs of pain, dyspnea, anxiety, delirium, agitation, nausea, vomiting, secretions and constipation  Metastatic Brain Disease Seizure prevention - Monitor for signs of new seizures - continue Keppra, Valproic acid, and Decadron    PAD, s/p L AKA -Monitor for signs and symptoms of pain  Code status - DNR  2.  Labs / imaging needed at time of follow-up: none  3.  Pending labs/ test needing follow-up:none  4.  Medication Changes    ADDED  glycopyrrolate 1 MG tablet  HYDROcodone-acetaminophen 5-325 MG tablet  LORazepam 1 MG tablet  morphine CONCENTRATE 10 MG/0.5ML Soln concentrated solution  ondansetron 4 MG disintegrating tablet  pantoprazole 40 MG tablet  senna 8.6 MG Tabs tablet   STOPPED Amlodipine atorvastatin  Follow-up Appointments: None   Hospital Course by problem list:  Roger Solis is a 66 year old male with a pertinent past medical history of squamous cell carcinoma of the lung, diagnosed in 2020, with brain metastasis diagnosed in 2022, s/p right temporoparietal resection, chemotherapy, 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  After discussion with patient and family patient decided to move forward with home hospice.  Patient has remained comfortable during this hospitalization.  Patient started on comfort measures while inpatient.  See list above for new medications added for transition to home.  metastatic brain disease Squamous cell carcinoma of the lung Seizures New visual disturbance Admission MRI with no interval change in size or appearance of enhancing areas on the right and left frontal lobes since June 2023.  MRI identified some areas of vasogenic edema with mild midline shift, deemed insignificant.  However, it is possible that  these mild changes could be contributing to the blurred vision patient described on admission.  After palliative care was consulted, and patient was transitioned to comfort care, patient's case was discussed with Dr. Mickeal Skinner, neuro oncologist.  Decision was made to increase Decadron 4 mg 4 times a day, and restarting antiseizure medication for comfort.  PAD s/p AKA 08/06/2021 CAD, CABG  Patient with recent left AKA in June 2023.  Positive for some erythematous, nonpurulent, edematous area over incision site.  Monitor for signs and symptoms of pain  COPD No evidence of exacerbation at this time.  Increase secretions.  Recommend continue monitoring respiratory status and manage as above   Discharge Subjective: Patient laying in bed comfortably.  He wants to go home today.  Continues to have blurry vision.  Denies pain, continues to tolerate p.o. intake.  Less secretion in his mouth today  Discharge Exam:   Blood pressure (!) 161/71, pulse 67, temperature 98.2 F  (36.8 C), temperature source Oral, resp. rate 18, height 6\' 1"  (1.854 m), weight 77.1 kg, SpO2 98 %.  Constitutional:ill-appearing man laying in bed, in no acute distress Discharge physical exam deferred as patient is in comfort measures only  Pertinent Labs, Studies, and Procedures:     Latest Ref Rng & Units 09/13/2021    5:37 AM 09/12/2021    2:11 PM 09/01/2021    3:24 AM  CBC  WBC 4.0 - 10.5 K/uL 12.9  16.1  7.7   Hemoglobin 13.0 - 17.0 g/dL 12.0  12.2  11.7   Hematocrit 39.0 - 52.0 % 37.7  38.6  34.9   Platelets 150 - 400 K/uL 143  154  215        Latest Ref Rng & Units 09/13/2021    5:37 AM 09/12/2021    2:11 PM 09/01/2021    3:24 AM  CMP  Glucose 70 - 99 mg/dL 77  148  116   BUN 8 - 23 mg/dL 23  23  18    Creatinine 0.61 - 1.24 mg/dL 0.77  0.94  0.68   Sodium 135 - 145 mmol/L 141  143  140   Potassium 3.5 - 5.1 mmol/L 4.3  4.0  4.1   Chloride 98 - 111 mmol/L 106  102  103   CO2 22 - 32 mmol/L 26  24  23    Calcium 8.9 - 10.3 mg/dL 8.3  8.9  9.5   Total Protein 6.5 - 8.1 g/dL  5.8    Total Bilirubin 0.3 - 1.2 mg/dL  0.4    Alkaline Phos 38 - 126 U/L  45    AST 15 - 41 U/L  20    ALT 0 - 44 U/L  15      VAS [place] ABI WITH/WO TBI  Result Date: 09/13/2021  LOWER EXTREMITY DOPPLER STUDY Patient Name:  Roger Solis  Date of Exam:   09/13/2021 Medical Rec #: [id number]        Accession #:    [id number] Date of Birth: 01-02-1956         Patient Gender: M Patient Age:   44 years Exam [facility]  [facility] Procedure:      VAS [place] ABI WITH/WO TBI Referring Phys: GRACE LAU --------------------------------------------------------------------------------  Indications: Peripheral artery disease.  Comparison       No recent previous exam found. Last noted is 2015 with Rt ABI Study:           0.8. Performing Technologist: Bobetta Lime BS RVT  Examination Guidelines: A complete evaluation includes at minimum, Doppler waveform signals and systolic blood pressure reading at the level of  bilateral brachial, anterior tibial, and posterior tibial arteries, when vessel segments are accessible. Bilateral testing is considered an integral part of a complete examination. Photoelectric Plethysmograph (PPG) waveforms and toe systolic pressure readings are included as required and additional duplex testing as needed. Limited examinations for reoccurring indications may be performed as noted.  ABI Findings: +---------+------------------+-----+----------+--------+ Right    Rt Pressure (mmHg)IndexWaveform  Comment  +---------+------------------+-----+----------+--------+ Brachial 137                    triphasic          +---------+------------------+-----+----------+--------+ PTA      83                0.58 monophasic         +---------+------------------+-----+----------+--------+ PERO     92                0.64 monophasic         +---------+------------------+-----+----------+--------+ Great Toe60                0.42                    +---------+------------------+-----+----------+--------+ +--------+------------------+-----+---------+-------+ Left    Lt Pressure (mmHg)IndexWaveform Comment +--------+------------------+-----+---------+-------+ [id number]                    triphasic        +--------+------------------+-----+---------+-------+ +-------+-----------+-----------+------------+------------+ ABI/TBIToday's ABIToday's TBIPrevious ABIPrevious TBI +-------+-----------+-----------+------------+------------+ Right  .64        0.42                                +-------+-----------+-----------+------------+------------+ Left AKA.  Summary: Right: Resting right ankle-brachial index indicates moderate right lower extremity arterial disease. The right toe-brachial index is abnormal. Left: Left AKA. *See table(s) above for measurements and observations.  Electronically signed by Harold Barban MD on 09/13/2021 at 9:56:37 PM.    Final    MR BRAIN W WO  CONTRAST  Result Date: 09/12/2021 CLINICAL DATA:  Initial evaluation for metastatic lung cancer, blurry vision. EXAM: MRI HEAD WITHOUT AND WITH CONTRAST TECHNIQUE: Multiplanar, multiecho pulse sequences of the brain and surrounding structures were obtained without and with intravenous contrast. CONTRAST:  7.44mL GADAVIST GADOBUTROL 1 MMOL/ML IV SOLN COMPARISON:  MRI from 08/31/2021. FINDINGS: Brain: Postoperative changes from prior right-sided craniotomy. Underlying irregular enhancing area at the level of the right frontal operculum measures 3.5 x 2.7 x 2.5 cm. Surrounding vasogenic edema throughout the adjacent right cerebral hemisphere. Mild mass effect on the right lateral ventricle without significant midline shift. Additional enhancing lesion at the contralateral left frontal lobe measures 1.3 x 0.9 x 1.2 cm. More mild localized vasogenic edema about this lesion. Overall, these findings are not significantly changed or progressed from prior. No new lesions identified. Underlying atrophy with chronic microvascular ischemic disease noted. No evidence for acute or subacute infarct. No other mass lesion. No hydrocephalus. No extra-axial fluid collection. Pituitary gland within normal limits. Vascular: Major intracranial vascular flow voids are maintained. Skull and upper cervical spine: Craniocervical junction within normal limits. Bone marrow signal intensity normal. No focal marrow replacing lesion. Prior right craniotomy. Sinuses/Orbits: Globes and orbital soft tissues demonstrate no acute finding. Scattered mucosal thickening noted about the ethmoidal air cells and maxillary sinuses. Trace left mastoid effusion, of doubtful  significance. Other: None. IMPRESSION: 1. No significant interval change in size and appearance of enhancing areas/lesions involving the right frontal operculum and contralateral left frontal lobe since 08/31/2021. Surrounding vasogenic edema with mild mass effect without significant  midline shift. No new lesions. 2. No other acute intracranial abnormality. Electronically Signed   By: Jeannine Boga M.D.   On: 09/12/2021 22:22   DG Chest Port 1 View  Result Date: 09/12/2021 CLINICAL DATA:  A 66 year old male presents for evaluation due to cough. Reported history of lung and colon cancer. EXAM: PORTABLE CHEST 1 VIEW COMPARISON:  August 30, 2021. FINDINGS: EKG leads project over the chest. Post median sternotomy. Cardiomediastinal contours and hilar structures are stable. No lobar consolidation. No sign of pleural effusion. No pneumothorax. Interstitial prominence seen on the previous study is diminished. On limited assessment no acute skeletal process. IMPRESSION: 1. No acute cardiopulmonary disease. 2. Interstitial prominence seen on the previous study is diminished. Electronically Signed   By: Zetta Bills M.D.   On: 09/12/2021 19:11   CT Head Wo Contrast  Result Date: 09/12/2021 CLINICAL DATA:  Mental status changes of unknown cause. History of CNS cancer, prior surgery and radiation therapy EXAM: CT HEAD WITHOUT CONTRAST TECHNIQUE: Contiguous axial images were obtained from the base of the skull through the vertex without intravenous contrast. RADIATION DOSE REDUCTION: This exam was performed according to the departmental dose-optimization program which includes automated exposure control, adjustment of the mA and/or kV according to patient size and/or use of iterative reconstruction technique. COMPARISON:  08/30/2021 FINDINGS: Brain: Prior RIGHT temporoparietal craniotomy. Generalized atrophy. Large area of vasogenic edema again identified within the RIGHT frontal lobe. Small hyperdense focus RIGHT frontal subcortical white matter 7 mm diameter, unchanged, likely reflects known tumor. Overlying dural thickening at the craniotomy site. Small vessel chronic ischemic changes of deep cerebral white matter. No new intracranial hemorrhage, mass, or infarct. No new extra-axial  collections. Vascular: No hyperdense vessels. Extensive atherosclerotic calcification of internal carotid arteries and RIGHT vertebral artery at skull base. Skull: Post craniotomy changes RIGHT temporoparietal. No acute osseous findings. Sinuses/Orbits: Clear Other: N/A IMPRESSION: Again identified large area of white matter hypoattenuation consistent with vasogenic edema in RIGHT frontal lobe. 7 mm hyperdense focus RIGHT frontal lobe little changed from previous exam, likely known tumor. Postsurgical changes RIGHT craniotomy. No new intracranial abnormalities. Electronically Signed   By: Lavonia Dana M.D.   On: 09/12/2021 14:51     Discharge Instructions:   Signed: Romana Juniper, MD [facility] - PGY1 Pager: [phone] 09/15/2021, 11:22 AM

## 2021-09-15 NOTE — Plan of Care (Signed)
  Problem: Pain Managment: Goal: General experience of comfort will improve Outcome: Progressing   Problem: Safety: Goal: Ability to remain free from injury will improve Outcome: Progressing   

## 2021-09-17 LAB — CULTURE, BLOOD (ROUTINE X 2)
Culture: NO GROWTH
Special Requests: ADEQUATE

## 2021-09-18 ENCOUNTER — Inpatient Hospital Stay: Payer: No Typology Code available for payment source | Attending: Radiation Oncology | Admitting: Internal Medicine

## 2021-09-18 DIAGNOSIS — Y842 Radiological procedure and radiotherapy as the cause of abnormal reaction of the patient, or of later complication, without mention of misadventure at the time of the procedure: Secondary | ICD-10-CM

## 2021-09-18 DIAGNOSIS — I6789 Other cerebrovascular disease: Secondary | ICD-10-CM | POA: Diagnosis not present

## 2021-09-18 DIAGNOSIS — C7931 Secondary malignant neoplasm of brain: Secondary | ICD-10-CM

## 2021-09-18 DIAGNOSIS — G40901 Epilepsy, unspecified, not intractable, with status epilepticus: Secondary | ICD-10-CM | POA: Diagnosis not present

## 2021-09-18 NOTE — Progress Notes (Signed)
I connected with Roger Solis on 09/18/21 at 12:30 PM EDT by telephone visit and verified that I am speaking with the correct person using two identifiers.  I discussed the limitations, risks, security and privacy concerns of performing an evaluation and management service by telemedicine and the availability of in-person appointments. I also discussed with the patient that there may be a patient responsible charge related to this service. The patient expressed understanding and agreed to proceed.  Other persons participating in the visit and their role in the encounter:  Daughter  Patient's location:  Home  Provider's location:  Office  Chief Complaint:  Malignant neoplasm metastatic to brain [facility])  Status epilepticus ([place])  Radiation therapy induced brain necrosis  History of Present Ilness: Roger Solis provides history through his daughter today.  He has transitioned to hospice.  Remains confused, weak.  Fortunately no further seizures doing well on depakote and keppra.  Still on decadron 4mg  4x per day since discharge. Observations: Deffered exam Assessment and Plan: Malignant neoplasm metastatic to brain [facility])  Status epilepticus ([place])  Radiation therapy induced brain necrosis  Follow Up Instructions:  Comfort care plans in place.  We recommended gentle taper of decadron, starting with 4mg  TID x1 week, then 4mg  BID thereafter.    Daughter is agreeable with this, will let us or hospice team know if there are issues with the medication changes.  I discussed the assessment and treatment plan with the patient.  The patient was provided an opportunity to ask questions and all were answered.  The patient agreed with the plan and demonstrated understanding of the instructions.    The patient was advised to call back or seek an in-person evaluation if the symptoms worsen or if the condition fails to improve as anticipated.  I provided 5-10 minutes of non-face-to-face time during this  enocunter.  Ventura Sellers, MD   I provided 15 minutes of non face-to-face telephone visit time during this encounter, and > 50% was spent counseling as documented under my assessment & plan.

## 2021-10-07 ENCOUNTER — Other Ambulatory Visit: Payer: Self-pay | Admitting: Student

## 2021-10-12 ENCOUNTER — Other Ambulatory Visit: Payer: Self-pay | Admitting: Student

## 2021-10-12 NOTE — Telephone Encounter (Signed)
Not a Clinic patient. Has a pvt PCP.

## 2021-10-20 ENCOUNTER — Other Ambulatory Visit: Payer: No Typology Code available for payment source

## 2021-10-23 ENCOUNTER — Ambulatory Visit: Payer: No Typology Code available for payment source | Admitting: Internal Medicine

## 2021-11-03 DEATH — deceased

## 2022-05-18 ENCOUNTER — Telehealth: Payer: Self-pay | Admitting: Vascular Surgery

## 2022-05-18 NOTE — Telephone Encounter (Signed)
Spoke with pt's daughter who informed me that he passed away 11-06-21. Will be deleting recall from que.

## 2022-05-18 NOTE — Telephone Encounter (Unsigned)
Spoke with pt's daughter who informed me that he passed away Oct 24, 2021. Will be deleting recall from que.
